# Patient Record
Sex: Male | Born: 1945
Health system: Southern US, Community
[De-identification: ages and names within clinical notes are randomized; demographics above are authoritative.]

## PROBLEM LIST (undated history)

## (undated) DIAGNOSIS — I2699 Other pulmonary embolism without acute cor pulmonale: Secondary | ICD-10-CM

## (undated) DIAGNOSIS — I499 Cardiac arrhythmia, unspecified: Secondary | ICD-10-CM

## (undated) DIAGNOSIS — I4891 Unspecified atrial fibrillation: Secondary | ICD-10-CM

## (undated) DIAGNOSIS — H9190 Unspecified hearing loss, unspecified ear: Secondary | ICD-10-CM

## (undated) DIAGNOSIS — I82409 Acute embolism and thrombosis of unspecified deep veins of unspecified lower extremity: Secondary | ICD-10-CM

## (undated) DIAGNOSIS — I10 Essential (primary) hypertension: Secondary | ICD-10-CM

## (undated) DIAGNOSIS — M1990 Unspecified osteoarthritis, unspecified site: Secondary | ICD-10-CM

## (undated) DIAGNOSIS — Z8672 Personal history of thrombophlebitis: Secondary | ICD-10-CM

## (undated) DIAGNOSIS — E785 Hyperlipidemia, unspecified: Secondary | ICD-10-CM

## (undated) HISTORY — DX: Essential (primary) hypertension: I10

## (undated) HISTORY — DX: Hyperlipidemia, unspecified: E78.5

## (undated) HISTORY — PX: JOINT REPLACEMENT: SHX530

## (undated) HISTORY — DX: Unspecified atrial fibrillation: I48.91

## (undated) HISTORY — DX: Personal history of thrombophlebitis: Z86.72

## (undated) HISTORY — DX: Unspecified osteoarthritis, unspecified site: M19.90

---

## 1973-08-26 HISTORY — PX: KNEE SURGERY: SHX244

## 2007-11-26 ENCOUNTER — Other Ambulatory Visit: Payer: Self-pay

## 2007-11-26 ENCOUNTER — Inpatient Hospital Stay: Payer: Self-pay | Admitting: Internal Medicine

## 2007-11-26 DIAGNOSIS — I2699 Other pulmonary embolism without acute cor pulmonale: Secondary | ICD-10-CM

## 2007-11-26 HISTORY — DX: Other pulmonary embolism without acute cor pulmonale: I26.99

## 2009-04-19 IMAGING — CT CT CHEST W/ CM
1 series · 15 of 32 positions shown, 19 images · IV contrast (APPLIED)
Comparison: none

REASON FOR EXAM: r/o PE
COMMENTS:

[Series 4: soft tissue · axial · 0.84mm/px · z∈[-175,+128]mm · 15 of 113 slices shown, 19 images]
[im 8/113  soft-tissue]
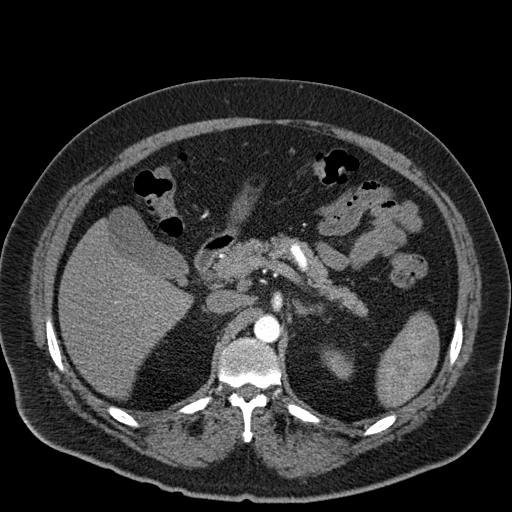
[im 8/113  bone]
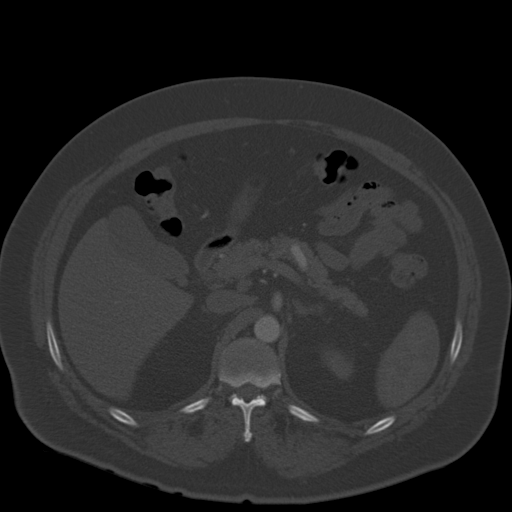
[im 15/113  soft-tissue]
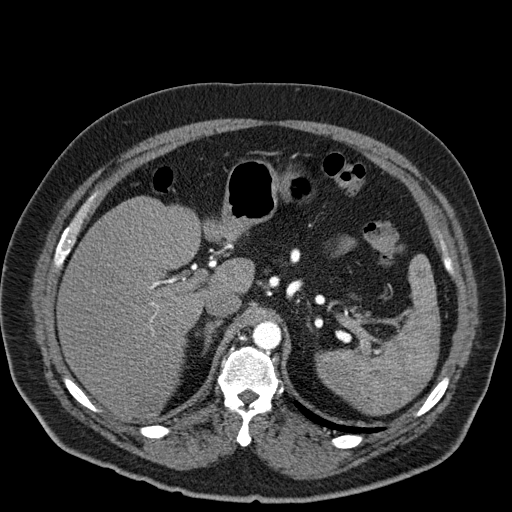
[im 22/113  soft-tissue]
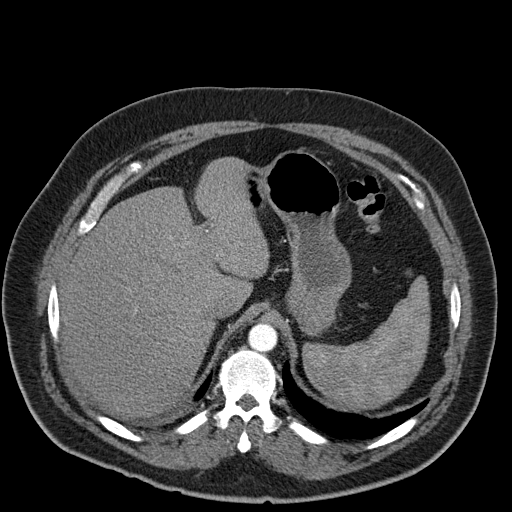
[im 33/113  soft-tissue]
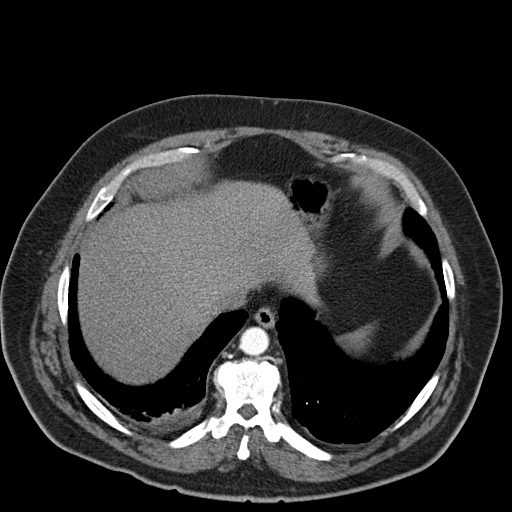
[im 40/113  soft-tissue]
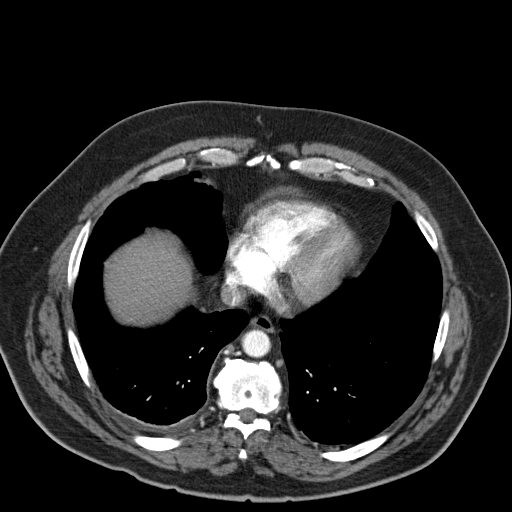
[im 47/113  soft-tissue]
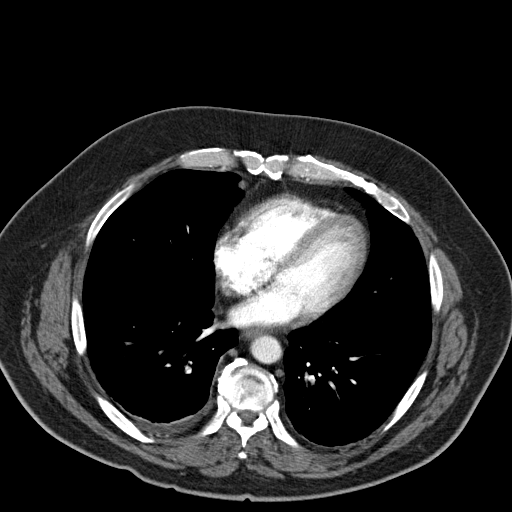
[im 58/113  soft-tissue]
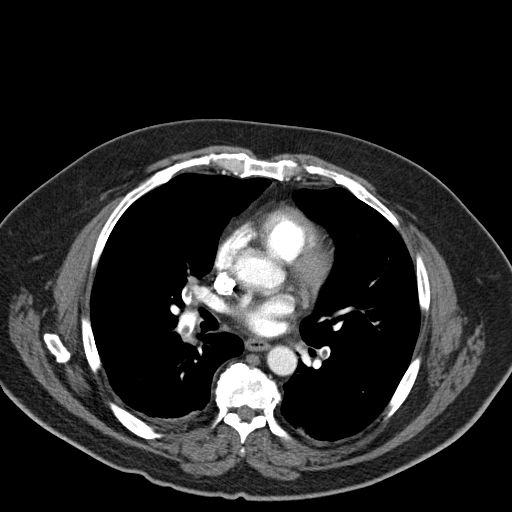
[im 66/113  soft-tissue]
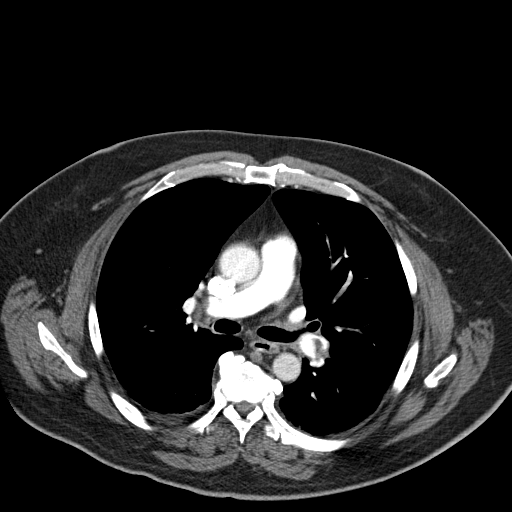
[im 73/113  soft-tissue]
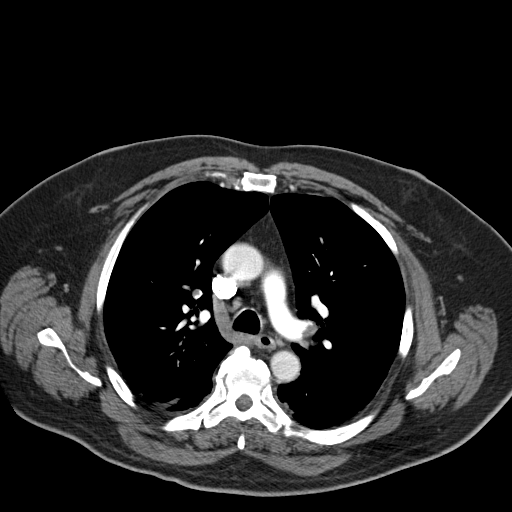
[im 73/113  bone]
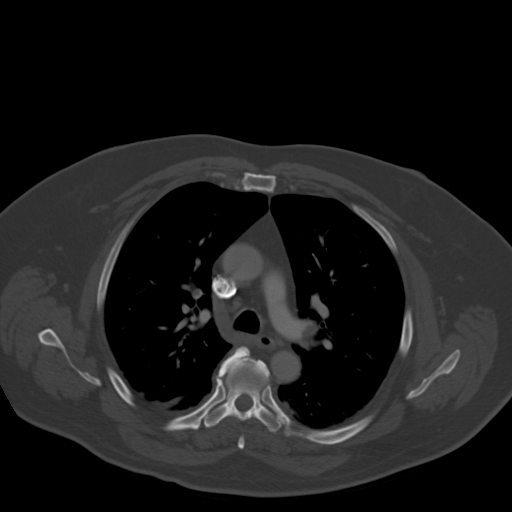
[im 80/113  soft-tissue]
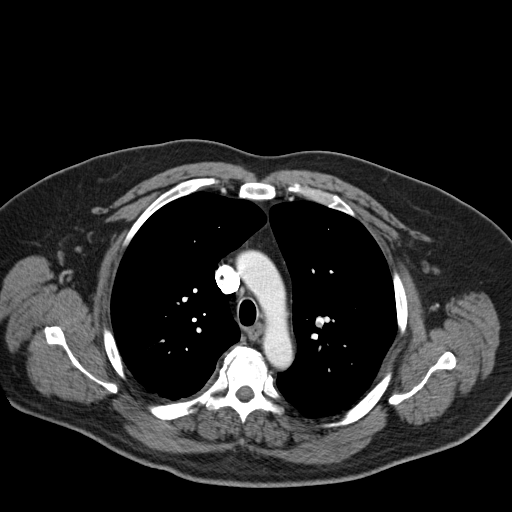
[im 91/113  soft-tissue]
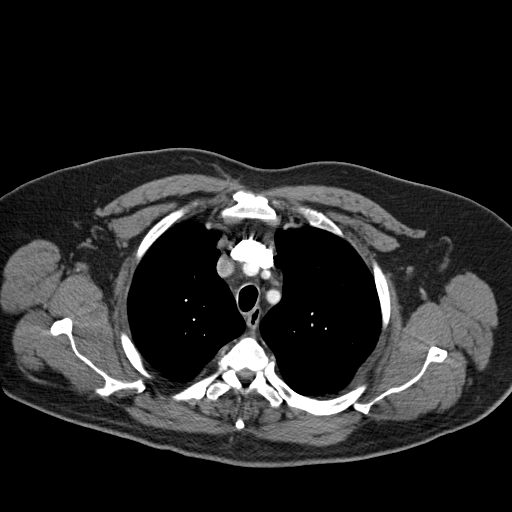
[im 98/113  soft-tissue]
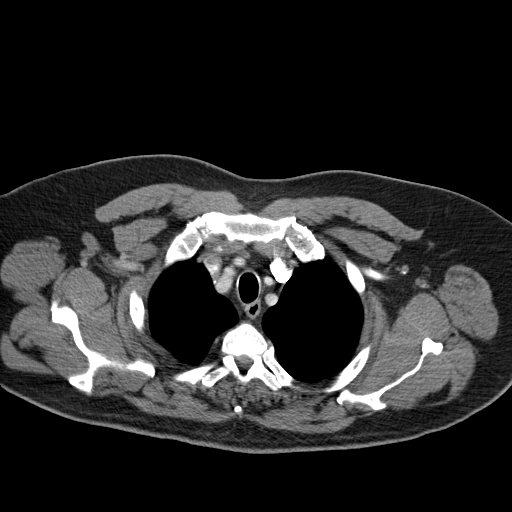
[im 98/113  lung]
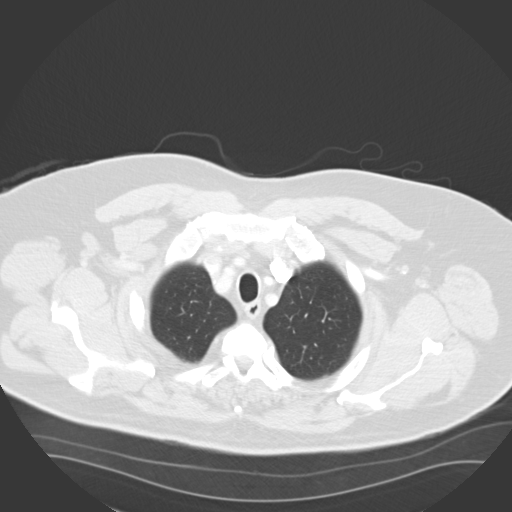
[im 102/113  lung]
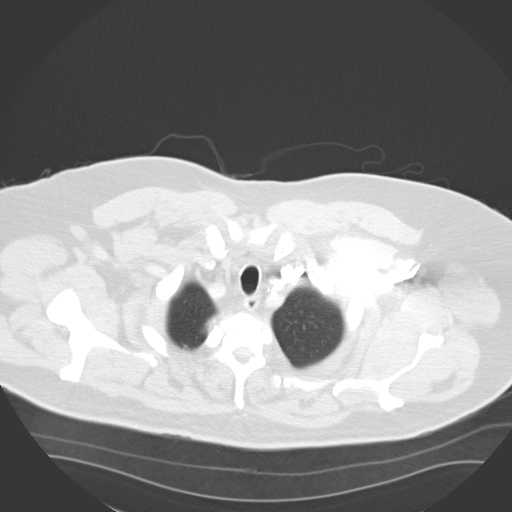
[im 105/113  soft-tissue]
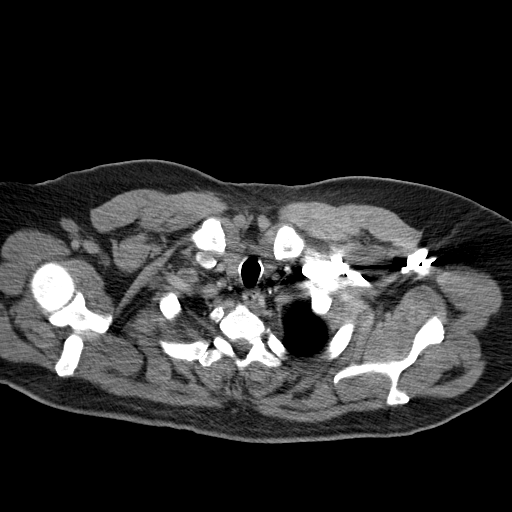
[im 105/113  lung]
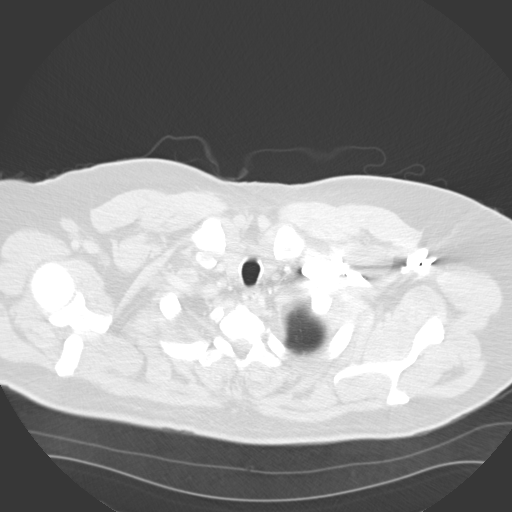
[im 109/113  lung]
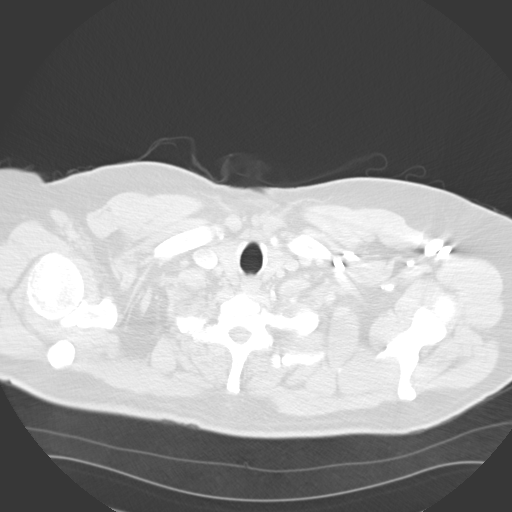

[15 of 32 positions shown; findings below may reference images not displayed]

PROCEDURE:     CT  - CT CHEST (FOR PE) W  - November 27, 2007  [DATE]

RESULT:     The patient received 85 cc of Isovue 370 for this study. There
are bilateral pulmonary emboli present. These lie in the proximal branches
of the right and left main pulmonary arteries. There is no evidence of
pulmonary infarction currently. There is a trace of fluid at the right lung
base with minimal atelectasis. The cardiac chambers are normal in size. The
caliber of the thoracic aorta is normal. No pathologic sized mediastinal or
hilar lymph nodes are evident. No abnormal pulmonary nodules are seen.
Within the upper abdomen the observed portions of the liver are normal. I
see no adrenal masses.
IMPRESSION: 1. There are bilateral pulmonary emboli present. These are chiefly in
branches of the lower lobe pulmonary arteries. I do not see evidence of
pulmonary infarction currently.
2. There is a tiny right pleural effusion and there is atelectasis in the
posterior costophrenic gutter on the right.

Dr.Leonela was paged with this result at 9 am on 27 November, 2007.

Critical value

## 2014-10-20 DIAGNOSIS — E785 Hyperlipidemia, unspecified: Secondary | ICD-10-CM | POA: Diagnosis not present

## 2014-10-20 DIAGNOSIS — Z125 Encounter for screening for malignant neoplasm of prostate: Secondary | ICD-10-CM | POA: Diagnosis not present

## 2014-10-20 DIAGNOSIS — I10 Essential (primary) hypertension: Secondary | ICD-10-CM | POA: Diagnosis not present

## 2014-10-20 DIAGNOSIS — Z7901 Long term (current) use of anticoagulants: Secondary | ICD-10-CM | POA: Diagnosis not present

## 2014-10-20 DIAGNOSIS — Z0000 Encounter for general adult medical examination without abnormal findings: Secondary | ICD-10-CM | POA: Diagnosis not present

## 2014-10-20 DIAGNOSIS — I4891 Unspecified atrial fibrillation: Secondary | ICD-10-CM | POA: Diagnosis not present

## 2014-10-31 DIAGNOSIS — G5602 Carpal tunnel syndrome, left upper limb: Secondary | ICD-10-CM | POA: Diagnosis not present

## 2014-11-03 ENCOUNTER — Ambulatory Visit: Payer: Self-pay | Admitting: Specialist

## 2014-11-03 DIAGNOSIS — Z01810 Encounter for preprocedural cardiovascular examination: Secondary | ICD-10-CM | POA: Diagnosis not present

## 2014-11-03 DIAGNOSIS — G5602 Carpal tunnel syndrome, left upper limb: Secondary | ICD-10-CM | POA: Diagnosis not present

## 2014-11-03 DIAGNOSIS — Z01812 Encounter for preprocedural laboratory examination: Secondary | ICD-10-CM | POA: Diagnosis not present

## 2014-11-03 DIAGNOSIS — I10 Essential (primary) hypertension: Secondary | ICD-10-CM | POA: Diagnosis not present

## 2014-11-08 ENCOUNTER — Ambulatory Visit: Payer: Self-pay | Admitting: Specialist

## 2014-11-08 DIAGNOSIS — G5602 Carpal tunnel syndrome, left upper limb: Secondary | ICD-10-CM | POA: Diagnosis not present

## 2014-11-08 DIAGNOSIS — Z862 Personal history of diseases of the blood and blood-forming organs and certain disorders involving the immune mechanism: Secondary | ICD-10-CM | POA: Diagnosis not present

## 2014-11-08 DIAGNOSIS — I10 Essential (primary) hypertension: Secondary | ICD-10-CM | POA: Diagnosis not present

## 2014-11-08 DIAGNOSIS — M1990 Unspecified osteoarthritis, unspecified site: Secondary | ICD-10-CM | POA: Diagnosis not present

## 2014-11-09 HISTORY — PX: HAND SURGERY: SHX662

## 2014-12-25 NOTE — Op Note (Signed)
PATIENT NAME:  Hector Bennett, Hector Bennett MR#:  [id number] DATE OF BIRTH:  [birth year]  DATE OF PROCEDURE:  11/08/2014  PREOPERATIVE DIAGNOSIS: Left carpal tunnel syndrome.   POSTOPERATIVE DIAGNOSIS: Left carpal tunnel syndrome.   PROCEDURE: Left carpal tunnel release.   SURGEON: Christophe Louis, M.D.   ANESTHESIA: General.   COMPLICATIONS: None.   TOURNIQUET TIME: 20 minutes.   DESCRIPTION OF PROCEDURE: After adequate induction of general anesthesia, the left upper extremity is thoroughly prepped with alcohol and ChloraPrep and draped in standard sterile fashion. The extremity is wrapped out with the Esmarch bandage and pneumatic tourniquet elevated to 250 mmHg. Under loupe magnification, standard volar carpal tunnel incision is made and the dissection carried down to the transverse retinacular ligament. Incision is made in the midportion with the knife. The distal release is performed with the small scissors. The proximal release is performed with the small scissors and the carpal tunnel scissors. There is seen to be moderate compression of the nerve directly beneath the ligament. Careful check is made both proximally and distally to ensure that complete release had been obtained. The wound is thoroughly irrigated multiple times. Skin edges were infiltrated with 0.5% plain Marcaine. The skin is closed with 4-0 nylon. A soft bulky dressing is applied. The tourniquet is released and the patient is returned to the recovery room in satisfactory condition having tolerated the procedure quite well. ____________________________ Lucas Mallow, MD ces:sb D: 11/08/2014 08:20:38 ET T: 11/08/2014 09:56:19 ET JOB#: [id number]  cc: Lucas Mallow, MD, <Dictator> Lucas Mallow MD ELECTRONICALLY SIGNED 11/19/2014 13:03

## 2015-03-20 ENCOUNTER — Other Ambulatory Visit: Payer: Self-pay | Admitting: Family Medicine

## 2015-04-20 ENCOUNTER — Encounter: Payer: Self-pay | Admitting: Family Medicine

## 2015-04-20 ENCOUNTER — Ambulatory Visit (INDEPENDENT_AMBULATORY_CARE_PROVIDER_SITE_OTHER): Payer: Medicare Other | Admitting: Family Medicine

## 2015-04-20 VITALS — BP 125/78 | HR 61 | Temp 98.0°F | Ht 69.4 in | Wt 290.0 lb

## 2015-04-20 DIAGNOSIS — I10 Essential (primary) hypertension: Secondary | ICD-10-CM

## 2015-04-20 DIAGNOSIS — I4891 Unspecified atrial fibrillation: Secondary | ICD-10-CM | POA: Insufficient documentation

## 2015-04-20 DIAGNOSIS — E785 Hyperlipidemia, unspecified: Secondary | ICD-10-CM

## 2015-04-20 DIAGNOSIS — Z7901 Long term (current) use of anticoagulants: Secondary | ICD-10-CM

## 2015-04-20 DIAGNOSIS — I481 Persistent atrial fibrillation: Secondary | ICD-10-CM | POA: Diagnosis not present

## 2015-04-20 DIAGNOSIS — I4819 Other persistent atrial fibrillation: Secondary | ICD-10-CM

## 2015-04-20 DIAGNOSIS — M1990 Unspecified osteoarthritis, unspecified site: Secondary | ICD-10-CM | POA: Diagnosis not present

## 2015-04-20 LAB — CBC WITH DIFFERENTIAL/PLATELET
HEMATOCRIT: 44.9 % (ref 37.5–51.0)
HEMOGLOBIN: 15.1 g/dL (ref 12.6–17.7)
Lymphocytes Absolute: 1.4 10*3/uL (ref 0.7–3.1)
Lymphs: 16 %
MCH: 31.1 pg (ref 26.6–33.0)
MCHC: 33.6 g/dL (ref 31.5–35.7)
MCV: 92 fL (ref 79–97)
MID (Absolute): 0.8 10*3/uL (ref 0.1–1.6)
MID: 9 %
NEUTROS ABS: 6.6 10*3/uL (ref 1.4–7.0)
NEUTROS PCT: 76 %
Platelets: 231 10*3/uL (ref 150–379)
RBC: 4.86 x10E6/uL (ref 4.14–5.80)
RDW: 14.1 % (ref 12.3–15.4)
WBC: 8.8 10*3/uL (ref 3.4–10.8)

## 2015-04-20 LAB — LP+ALT+AST PICCOLO, WAIVED
ALT (SGPT) PICCOLO, WAIVED: 14 U/L (ref 10–47)
AST (SGOT) PICCOLO, WAIVED: 20 U/L (ref 11–38)
CHOL/HDL RATIO PICCOLO,WAIVE: 2.8 mg/dL
Cholesterol Piccolo, Waived: 133 mg/dL (ref <200)
HDL CHOL PICCOLO, WAIVED: 48 mg/dL — AB (ref >59)
LDL Chol Calc Piccolo Waived: 57 mg/dL (ref <100)
TRIGLYCERIDES PICCOLO,WAIVED: 138 mg/dL (ref <150)
VLDL Chol Calc Piccolo,Waive: 28 mg/dL (ref <30)

## 2015-04-20 MED ORDER — RIVAROXABAN 20 MG PO TABS: 20.0000 mg | ORAL_TABLET | Freq: Every day | ORAL | Status: DC

## 2015-04-20 MED ORDER — LOVASTATIN 20 MG PO TABS: 20.0000 mg | ORAL_TABLET | Freq: Every day | ORAL | Status: DC

## 2015-04-20 MED ORDER — BENAZEPRIL-HYDROCHLOROTHIAZIDE 20-25 MG PO TABS: ORAL_TABLET | ORAL | Status: DC

## 2015-04-20 MED ORDER — ATENOLOL 50 MG PO TABS: 50.0000 mg | ORAL_TABLET | Freq: Every day | ORAL | Status: DC

## 2015-04-20 NOTE — Assessment & Plan Note (Signed)
The current medical regimen is effective;  continue present plan and medications.  

## 2015-04-20 NOTE — Progress Notes (Signed)
BP 125/78 mmHg  Pulse 61  Temp(Src) 98 F (36.7 C)  Ht 5' 9.4" (1.763 m)  Wt 290 lb (131.543 kg)  BMI 42.32 kg/m2  SpO2 96%   Subjective:    Patient ID: Hector Bennett, male    DOB: 1946/05/04, 69 y.o.   MRN: [id number]  HPI: Hector Bennett is a 69 y.o. male  Chief Complaint  Patient presents with  . Hyperlipidemia  . Hypertension  . long term use of anticoagulation   Asian follow-up hypertension hypercholesterol. Has been doing well with no side effects, taking medications faithfully Taking Xarelto without problems no bleeding no obvious bruising Taking for atrial fibrillation which is asymptomatic.  Patient's biggest concern today is his left knee which is chronically hurts sore tender Does not click or lock hurts with stepping walking has to limp some with walking problems been getting worse over this last year and especially worse over this summer. He is concerned and partially resigned to having knee replacement surgery.  Relevant past medical, surgical, family and social history reviewed and updated as indicated. Interim medical history since our last visit reviewed. Allergies and medications reviewed and updated.  Review of Systems  Constitutional: Negative.   Respiratory: Negative.   Cardiovascular: Negative.     Per HPI unless specifically indicated above     Objective:    BP 125/78 mmHg  Pulse 61  Temp(Src) 98 F (36.7 C)  Ht 5' 9.4" (1.763 m)  Wt 290 lb (131.543 kg)  BMI 42.32 kg/m2  SpO2 96%  Wt Readings from Last 3 Encounters:  04/20/15 290 lb (131.543 kg)  11/21/14 300 lb (136.079 kg)    Physical Exam  Constitutional: He is oriented to person, place, and time. He appears well-developed and well-nourished. No distress.  HENT:  Head: Normocephalic and atraumatic.  Right Ear: Hearing normal.  Left Ear: Hearing normal.  Nose: Nose normal.  Eyes: Conjunctivae and lids are normal. Right eye exhibits no discharge. Left eye exhibits no  discharge. No scleral icterus.  Cardiovascular: Normal heart sounds.   Pulmonary/Chest: Effort normal and breath sounds normal. No respiratory distress.  Musculoskeletal: Normal range of motion.  Knee exam with no joint effusion noted Some bony abnormality Full range of motion No clicking or locking  Neurological: He is alert and oriented to person, place, and time.  Skin: Skin is intact. No rash noted.  Psychiatric: He has a normal mood and affect. His speech is normal and behavior is normal. Judgment and thought content normal. Cognition and memory are normal.    No results found for this or any previous visit.    Assessment & Plan:   Problem List Items Addressed This Visit      Cardiovascular and Mediastinum   Hypertension    The current medical regimen is effective;  continue present plan and medications.       Relevant Medications   rivaroxaban (XARELTO) 20 MG TABS tablet   lovastatin (MEVACOR) 20 MG tablet   benazepril-hydrochlorthiazide (LOTENSIN HCT) 20-25 MG per tablet   atenolol (TENORMIN) 50 MG tablet   A-fib    Doing well on Xarelto with no side effects CBC normal today      Relevant Medications   rivaroxaban (XARELTO) 20 MG TABS tablet   lovastatin (MEVACOR) 20 MG tablet   benazepril-hydrochlorthiazide (LOTENSIN HCT) 20-25 MG per tablet   atenolol (TENORMIN) 50 MG tablet     Musculoskeletal and Integument   Arthritis    Arthritis changes knees left  worse than right Will refer to orthopedics to further evaluate Wants to see Dr. Marry Guan at [facility]. Discussed medication and to avoid nonsteroidal anti-inflammatory medications because of taking blood thinner and potential for GI bleeding. We'll use Tylenol for his knees May consider neoprene sleeve Discussed weight loss      Relevant Orders   Ambulatory referral to Orthopedic Surgery     Other   Hyperlipidemia    The current medical regimen is effective;  continue present plan and  medications.       Relevant Medications   rivaroxaban (XARELTO) 20 MG TABS tablet   lovastatin (MEVACOR) 20 MG tablet   benazepril-hydrochlorthiazide (LOTENSIN HCT) 20-25 MG per tablet   atenolol (TENORMIN) 50 MG tablet   Other Relevant Orders   LP+ALT+AST Piccolo, Waived    Other Visit Diagnoses    Essential hypertension, benign    -  Primary    Relevant Medications    rivaroxaban (XARELTO) 20 MG TABS tablet    lovastatin (MEVACOR) 20 MG tablet    benazepril-hydrochlorthiazide (LOTENSIN HCT) 20-25 MG per tablet    atenolol (TENORMIN) 50 MG tablet    Other Relevant Orders    Basic metabolic panel    Long-term (current) use of anticoagulants        Relevant Medications    rivaroxaban (XARELTO) 20 MG TABS tablet    Other Relevant Orders    CBC With Differential/Platelet        Follow up plan: Return in about 6 months (around 10/21/2015) for Physical Exam.

## 2015-04-20 NOTE — Assessment & Plan Note (Signed)
Doing well on Xarelto with no side effects CBC normal today

## 2015-04-20 NOTE — Assessment & Plan Note (Addendum)
Arthritis changes knees left worse than right Will refer to orthopedics to further evaluate Wants to see Dr. Marry Guan at [facility]. Discussed medication and to avoid nonsteroidal anti-inflammatory medications because of taking blood thinner and potential for GI bleeding. We'll use Tylenol for his knees May consider neoprene sleeve Discussed weight loss

## 2015-04-21 LAB — BASIC METABOLIC PANEL
BUN/Creatinine Ratio: 12 (ref 10–22)
BUN: 12 mg/dL (ref 8–27)
CALCIUM: 9.2 mg/dL (ref 8.6–10.2)
CO2: 22 mmol/L (ref 18–29)
Chloride: 100 mmol/L (ref 97–108)
Creatinine, Ser: 1.03 mg/dL (ref 0.76–1.27)
GFR calc Af Amer: 84 mL/min/{1.73_m2} (ref >59)
GFR calc non Af Amer: 73 mL/min/{1.73_m2} (ref >59)
GLUCOSE: 104 mg/dL — AB (ref 65–99)
Potassium: 4.2 mmol/L (ref 3.5–5.2)
Sodium: 141 mmol/L (ref 134–144)

## 2015-04-24 ENCOUNTER — Encounter: Payer: Self-pay | Admitting: Family Medicine

## 2015-05-04 DIAGNOSIS — M25561 Pain in right knee: Secondary | ICD-10-CM | POA: Diagnosis not present

## 2015-05-04 DIAGNOSIS — M170 Bilateral primary osteoarthritis of knee: Secondary | ICD-10-CM | POA: Diagnosis not present

## 2015-05-04 DIAGNOSIS — M25562 Pain in left knee: Secondary | ICD-10-CM | POA: Diagnosis not present

## 2015-05-23 DIAGNOSIS — I2699 Other pulmonary embolism without acute cor pulmonale: Secondary | ICD-10-CM | POA: Diagnosis not present

## 2015-05-23 DIAGNOSIS — M1712 Unilateral primary osteoarthritis, left knee: Secondary | ICD-10-CM | POA: Diagnosis not present

## 2015-05-29 DIAGNOSIS — M7989 Other specified soft tissue disorders: Secondary | ICD-10-CM | POA: Diagnosis not present

## 2015-05-29 DIAGNOSIS — M1990 Unspecified osteoarthritis, unspecified site: Secondary | ICD-10-CM | POA: Diagnosis not present

## 2015-05-29 DIAGNOSIS — M79609 Pain in unspecified limb: Secondary | ICD-10-CM | POA: Diagnosis not present

## 2015-05-29 DIAGNOSIS — I87099 Postthrombotic syndrome with other complications of unspecified lower extremity: Secondary | ICD-10-CM | POA: Diagnosis not present

## 2015-05-29 DIAGNOSIS — I872 Venous insufficiency (chronic) (peripheral): Secondary | ICD-10-CM | POA: Diagnosis not present

## 2015-05-30 ENCOUNTER — Other Ambulatory Visit: Payer: Self-pay | Admitting: Vascular Surgery

## 2015-05-31 ENCOUNTER — Other Ambulatory Visit: Payer: Self-pay | Admitting: Family Medicine

## 2015-05-31 NOTE — Progress Notes (Unsigned)
Patient taking Xarelto for atrial fibrillation. His been doing well with no excessive bleeding problems. Patient has knee replacement surgery scheduled in the near future. There is no need to bridge with Lovenox. Stop the Xarelto 2 days prior to surgery. Then the next day after surgery if there is no signs or symptoms of bleeding restart Xarelto. Hector Bennett was last seen in February 2016 for his regular physical and was okay for surgery at that time. We will be contacting him to come in ASAP for reassessment of his hypertension, hypercholesterol and preoperative status.

## 2015-06-08 ENCOUNTER — Ambulatory Visit: Payer: Self-pay | Admitting: Family Medicine

## 2015-07-11 ENCOUNTER — Encounter: Payer: Self-pay | Admitting: Family Medicine

## 2015-07-11 ENCOUNTER — Ambulatory Visit (INDEPENDENT_AMBULATORY_CARE_PROVIDER_SITE_OTHER): Payer: Medicare Other | Admitting: Family Medicine

## 2015-07-11 VITALS — BP 118/73 | HR 61 | Temp 98.7°F | Ht 68.3 in | Wt 290.0 lb

## 2015-07-11 DIAGNOSIS — M1712 Unilateral primary osteoarthritis, left knee: Secondary | ICD-10-CM

## 2015-07-11 DIAGNOSIS — M1711 Unilateral primary osteoarthritis, right knee: Secondary | ICD-10-CM | POA: Insufficient documentation

## 2015-07-11 DIAGNOSIS — I824Y1 Acute embolism and thrombosis of unspecified deep veins of right proximal lower extremity: Secondary | ICD-10-CM

## 2015-07-11 DIAGNOSIS — I82409 Acute embolism and thrombosis of unspecified deep veins of unspecified lower extremity: Secondary | ICD-10-CM | POA: Insufficient documentation

## 2015-07-11 DIAGNOSIS — Z23 Encounter for immunization: Secondary | ICD-10-CM | POA: Diagnosis not present

## 2015-07-11 NOTE — Assessment & Plan Note (Signed)
Patient doing well with no symptoms With knee replacement surgery coming up filter will be placed an inferior vena cava

## 2015-07-11 NOTE — Assessment & Plan Note (Signed)
>>  ASSESSMENT AND PLAN FOR OBESITY, CLASS III, BMI 40-49.9 (MORBID OBESITY) (HCC) WRITTEN ON 07/11/2015  4:23 PM BY Baelynn Schmuhl A, MD  Discuss weight loss

## 2015-07-11 NOTE — Progress Notes (Signed)
BP 118/73 mmHg  Pulse 61  Temp(Src) 98.7 F (37.1 C)  Ht 5' 8.3" (1.735 m)  Wt 290 lb (131.543 kg)  BMI 43.70 kg/m2  SpO2 94%   Subjective:    Patient ID: Hector Bennett, male    DOB: 12-02-1945, 69 y.o.   MRN: [id number]  HPI: Hector Bennett is a 69 y.o. male  Chief Complaint  Patient presents with  . surgical clearance    total left knee replacement   patient all in all doing well no complaints except for knee. Has been taking blood pressure medication without problems and good control of blood pressure Taking lovastatin without problems Xarelto with no bleeding issues no bruising No chest pain no angina symptoms no shortness of breath no PND orthopnea Patient's no swelling left leg right leg with history of DVT mild occasional edema otherwise clear.  Relevant past medical, surgical, family and social history reviewed and updated as indicated. Interim medical history since our last visit reviewed. Allergies and medications reviewed and updated. Other than noted above Review of Systems  Constitutional: Negative.   HENT: Negative.   Eyes: Negative.   Respiratory: Negative.   Cardiovascular: Negative.   Gastrointestinal: Negative.   Endocrine: Negative.   Genitourinary: Negative.   Musculoskeletal: Negative.   Skin: Negative.   Allergic/Immunologic: Negative.   Neurological: Negative.   Hematological: Negative.   Psychiatric/Behavioral: Negative.     Per HPI unless specifically indicated above     Objective:    BP 118/73 mmHg  Pulse 61  Temp(Src) 98.7 F (37.1 C)  Ht 5' 8.3" (1.735 m)  Wt 290 lb (131.543 kg)  BMI 43.70 kg/m2  SpO2 94%  Wt Readings from Last 3 Encounters:  07/11/15 290 lb (131.543 kg)  04/20/15 290 lb (131.543 kg)  11/21/14 300 lb (136.079 kg)    Physical Exam  Constitutional: He is oriented to person, place, and time. He appears well-developed and well-nourished. No distress.  HENT:  Head: Normocephalic and atraumatic.  Right  Ear: Hearing and external ear normal.  Left Ear: Hearing and external ear normal.  Nose: Nose normal.  Mouth/Throat: Oropharynx is clear and moist.  Eyes: Conjunctivae and lids are normal. Pupils are equal, round, and reactive to light. Right eye exhibits no discharge. Left eye exhibits no discharge. No scleral icterus.  Neck: No JVD present. No thyromegaly present.  Cardiovascular: Normal rate and normal heart sounds.   No murmur heard. Pulmonary/Chest: Effort normal and breath sounds normal. No respiratory distress. He has no wheezes. He has no rales.  Abdominal: Soft. Bowel sounds are normal. He exhibits mass. He exhibits no distension. There is no tenderness. There is no rebound and no guarding.  Musculoskeletal: Normal range of motion. He exhibits no edema or tenderness.  Lymphadenopathy:    He has no cervical adenopathy.  Neurological: He is alert and oriented to person, place, and time. Coordination normal.  Skin: Skin is warm, dry and intact. No rash noted.  Psychiatric: He has a normal mood and affect. His speech is normal and behavior is normal. Judgment and thought content normal. Cognition and memory are normal.    Results for orders placed or performed in visit on 04/20/15  CBC With Differential/Platelet  Result Value Ref Range   WBC 8.8 3.4 - 10.8 x10E3/uL   RBC 4.86 4.14 - 5.80 x10E6/uL   Hemoglobin 15.1 12.6 - 17.7 g/dL   Hematocrit 44.9 37.5 - 51.0 %   MCV 92 79 - 97 fL  MCH 31.1 26.6 - 33.0 pg   MCHC 33.6 31.5 - 35.7 g/dL   RDW 14.1 12.3 - 15.4 %   Platelets 231 150 - 379 x10E3/uL   Neutrophils 76 %   Lymphs 16 %   MID 9 %   Neutrophils Absolute 6.6 1.4 - 7.0 x10E3/uL   Lymphocytes Absolute 1.4 0.7 - 3.1 x10E3/uL   MID (Absolute) 0.8 0.1 - 1.6 X10E3/uL  LP+ALT+AST Piccolo, Waived  Result Value Ref Range   ALT (SGPT) Piccolo, Waived 14 10 - 47 U/L   AST (SGOT) Piccolo, Waived 20 11 - 38 U/L   Cholesterol Piccolo, Waived 133 <200 mg/dL   HDL Chol Piccolo,  Waived 48 (L) >59 mg/dL   Triglycerides Piccolo,Waived 138 <150 mg/dL   Chol/HDL Ratio Piccolo,Waive 2.8 mg/dL   LDL Chol Calc Piccolo Waived 57 <100 mg/dL   VLDL Chol Calc Piccolo,Waive 28 <30 mg/dL  Basic metabolic panel  Result Value Ref Range   Glucose 104 (H) 65 - 99 mg/dL   BUN 12 8 - 27 mg/dL   Creatinine, Ser 1.03 0.76 - 1.27 mg/dL   GFR calc non Af Amer 73 >59 mL/min/1.73   GFR calc Af Amer 84 >59 mL/min/1.73   BUN/Creatinine Ratio 12 10 - 22   Sodium 141 134 - 144 mmol/L   Potassium 4.2 3.5 - 5.2 mmol/L   Chloride 100 97 - 108 mmol/L   CO2 22 18 - 29 mmol/L   Calcium 9.2 8.6 - 10.2 mg/dL      Assessment & Plan:   Problem List Items Addressed This Visit      Cardiovascular and Mediastinum   DVT (deep venous thrombosis) (HCC)    Patient doing well with no symptoms With knee replacement surgery coming up filter will be placed an inferior vena cava        Musculoskeletal and Integument   Left knee DJD    Patient cleared for surgery for left knee replacement      Relevant Medications   naproxen sodium (RA NAPROXEN SODIUM) 220 MG tablet     Other   Obesity, Class III, BMI 40-49.9 (morbid obesity) (HCC)    Discuss weight loss       Other Visit Diagnoses    Immunization due    -  Primary    Relevant Orders    Flu Vaccine QUAD 36+ mos PF IM (Fluarix & Fluzone Quad PF) (Completed)        Follow up plan: Return for As scheduled.

## 2015-07-11 NOTE — Assessment & Plan Note (Signed)
Patient cleared for surgery for left knee replacement

## 2015-07-11 NOTE — Assessment & Plan Note (Signed)
Discuss weight loss

## 2015-07-19 ENCOUNTER — Encounter
Admission: RE | Admit: 2015-07-19 | Discharge: 2015-07-19 | Disposition: A | Discharge status: Home / Self Care | Payer: Medicare Other | Source: Ambulatory Visit | Attending: Orthopedic Surgery | Admitting: Orthopedic Surgery

## 2015-07-19 DIAGNOSIS — Z01810 Encounter for preprocedural cardiovascular examination: Secondary | ICD-10-CM | POA: Diagnosis not present

## 2015-07-19 DIAGNOSIS — Z01812 Encounter for preprocedural laboratory examination: Secondary | ICD-10-CM | POA: Diagnosis not present

## 2015-07-19 DIAGNOSIS — I2699 Other pulmonary embolism without acute cor pulmonale: Secondary | ICD-10-CM | POA: Diagnosis not present

## 2015-07-19 HISTORY — DX: Other pulmonary embolism without acute cor pulmonale: I26.99

## 2015-07-19 LAB — CBC
HEMATOCRIT: 44.7 % (ref 40.0–52.0)
HEMOGLOBIN: 14.8 g/dL (ref 13.0–18.0)
MCH: 30.7 pg (ref 26.0–34.0)
MCHC: 33.2 g/dL (ref 32.0–36.0)
MCV: 92.3 fL (ref 80.0–100.0)
Platelets: 196 10*3/uL (ref 150–440)
RBC: 4.84 MIL/uL (ref 4.40–5.90)
RDW: 13.7 % (ref 11.5–14.5)
WBC: 8 10*3/uL (ref 3.8–10.6)

## 2015-07-19 LAB — URINALYSIS COMPLETE WITH MICROSCOPIC (ARMC ONLY)
BACTERIA UA: NONE SEEN
Bilirubin Urine: NEGATIVE
Glucose, UA: NEGATIVE mg/dL
Hgb urine dipstick: NEGATIVE
KETONES UR: NEGATIVE mg/dL
Leukocytes, UA: NEGATIVE
NITRITE: NEGATIVE
PROTEIN: NEGATIVE mg/dL
Specific Gravity, Urine: 1.019 (ref 1.005–1.030)
pH: 6 (ref 5.0–8.0)

## 2015-07-19 LAB — BASIC METABOLIC PANEL
ANION GAP: 6 (ref 5–15)
BUN: 15 mg/dL (ref 6–20)
CALCIUM: 9 mg/dL (ref 8.9–10.3)
CO2: 25 mmol/L (ref 22–32)
Chloride: 108 mmol/L (ref 101–111)
Creatinine, Ser: 0.82 mg/dL (ref 0.61–1.24)
GFR calc non Af Amer: >60 mL/min (ref >60)
Glucose, Bld: 100 mg/dL — ABNORMAL HIGH (ref 65–99)
Potassium: 3.9 mmol/L (ref 3.5–5.1)
SODIUM: 139 mmol/L (ref 135–145)

## 2015-07-19 LAB — SURGICAL PCR SCREEN
MRSA, PCR: NEGATIVE
Staphylococcus aureus: NEGATIVE

## 2015-07-19 LAB — TYPE AND SCREEN
ABO/RH(D): A NEG
ANTIBODY SCREEN: NEGATIVE

## 2015-07-19 LAB — SEDIMENTATION RATE: SED RATE: 5 mm/h (ref 0–20)

## 2015-07-19 LAB — ABO/RH: ABO/RH(D): A NEG

## 2015-07-19 LAB — PROTIME-INR
INR: 1.66
PROTHROMBIN TIME: 19.6 s — AB (ref 11.4–15.0)

## 2015-07-19 LAB — APTT: aPTT: 35 seconds (ref 24–36)

## 2015-07-19 NOTE — Pre-Procedure Instructions (Addendum)
[place] at Dr. Clydell Hakim office regarding pt's history of pulmonary embolism and phlebitis and pt not being able to receive tranexamic acid per hospital protocol.

## 2015-07-19 NOTE — Patient Instructions (Addendum)
  Your procedure is scheduled on: Monday Dec. 5, 2016. Report to Same Day Surgery. To find out your arrival time please call [phone] between 1PM - 3PM on Friday Dec. 2, 2016 .  Remember: Instructions that are not followed completely may result in serious medical risk, up to and including death, or upon the discretion of your surgeon and anesthesiologist your surgery may need to be rescheduled.    _x___ 1. Do not eat food or drink liquids after midnight. No gum chewing or hard candies.     _x___ 2. No Alcohol for 24 hours before or after surgery.   ____ 3. Bring all medications with you on the day of surgery if instructed.    __x__ 4. Notify your doctor if there is any change in your medical condition     (cold, fever, infections).     Do not wear jewelry, make-up, hairpins, clips or nail polish.  Do not wear lotions, powders, or perfumes. You may wear deodorant.  Do not shave 48 hours prior to surgery. Men may shave face and neck.  Do not bring valuables to the hospital.    [facility] is not responsible for any belongings or valuables.               Contacts, dentures or bridgework may not be worn into surgery.  Leave your suitcase in the car. After surgery it may be brought to your room.  For patients admitted to the hospital, discharge time is determined by your treatment team.   Patients discharged the day of surgery will not be allowed to drive home.    Please read over the following fact sheets that you were given:   [facility] Preparing for Surgery  _x___ Take these medicines the morning of surgery with A SIP OF WATER:    1. atenolol (TENORMIN)     ____ Fleet Enema (as directed)   _x___ Use CHG Soap as directed  ____ Use inhalers on the day of surgery  ____ Stop metformin 2 days prior to surgery    ____ Take 1/2 of usual insulin dose the night before surgery and none on the morning of surgery.   _x___ Ask Smith Robert when to stop Xarelto.  __x__ Stop  Anti-inflammatories Aleve on Nov. 28, 2016.   _x___ Stop supplements Glucosamine Chondroitin  until after surgery.    ____ Bring C-Pap to the hospital.

## 2015-07-20 LAB — URINE CULTURE: Special Requests: NORMAL

## 2015-07-25 ENCOUNTER — Encounter
Admission: RE | Disposition: A | Discharge status: Home / Self Care | Payer: Self-pay | Source: Ambulatory Visit | Attending: Vascular Surgery

## 2015-07-25 ENCOUNTER — Encounter: Payer: Self-pay | Admitting: *Deleted

## 2015-07-25 ENCOUNTER — Ambulatory Visit
Admission: RE | Admit: 2015-07-25 | Discharge: 2015-07-25 | Disposition: A | Discharge status: Home / Self Care | Payer: Medicare Other | Source: Ambulatory Visit | Attending: Vascular Surgery | Admitting: Vascular Surgery

## 2015-07-25 DIAGNOSIS — Z79899 Other long term (current) drug therapy: Secondary | ICD-10-CM | POA: Diagnosis not present

## 2015-07-25 DIAGNOSIS — D6859 Other primary thrombophilia: Secondary | ICD-10-CM | POA: Diagnosis not present

## 2015-07-25 DIAGNOSIS — Z6841 Body Mass Index (BMI) 40.0 and over, adult: Secondary | ICD-10-CM | POA: Diagnosis not present

## 2015-07-25 DIAGNOSIS — M179 Osteoarthritis of knee, unspecified: Secondary | ICD-10-CM | POA: Diagnosis not present

## 2015-07-25 DIAGNOSIS — Z86718 Personal history of other venous thrombosis and embolism: Secondary | ICD-10-CM | POA: Diagnosis not present

## 2015-07-25 DIAGNOSIS — I80209 Phlebitis and thrombophlebitis of unspecified deep vessels of unspecified lower extremity: Secondary | ICD-10-CM | POA: Diagnosis not present

## 2015-07-25 DIAGNOSIS — E669 Obesity, unspecified: Secondary | ICD-10-CM | POA: Insufficient documentation

## 2015-07-25 HISTORY — PX: PERIPHERAL VASCULAR CATHETERIZATION: SHX172C

## 2015-07-25 HISTORY — DX: Acute embolism and thrombosis of unspecified deep veins of unspecified lower extremity: I82.409

## 2015-07-25 SURGERY — IVC FILTER INSERTION
Anesthesia: Moderate Sedation

## 2015-07-25 MED ORDER — SODIUM CHLORIDE 0.9 % IV SOLN
INTRAVENOUS | Status: DC
  Administered 2015-07-25 (×2): via INTRAVENOUS

## 2015-07-25 MED ORDER — MIDAZOLAM HCL 2 MG/2ML IJ SOLN
INTRAMUSCULAR | Status: AC
  Filled 2015-07-25: qty 2

## 2015-07-25 MED ORDER — LIDOCAINE HCL 1 % IJ SOLN
INTRAMUSCULAR | Status: DC | PRN
  Administered 2015-07-25: 5 mL

## 2015-07-25 MED ORDER — DEXTROSE 5 % IV SOLN
1.5000 g | INTRAVENOUS | Status: AC
  Administered 2015-07-25: 1.5 g via INTRAVENOUS
  Filled 2015-07-25: qty 1.5

## 2015-07-25 MED ORDER — LIDOCAINE HCL (PF) 1 % IJ SOLN
INTRAMUSCULAR | Status: AC
  Filled 2015-07-25: qty 10

## 2015-07-25 MED ORDER — FENTANYL CITRATE (PF) 100 MCG/2ML IJ SOLN
INTRAMUSCULAR | Status: AC
  Filled 2015-07-25: qty 2

## 2015-07-25 MED ORDER — HEPARIN (PORCINE) IN NACL 2-0.9 UNIT/ML-% IJ SOLN
INTRAMUSCULAR | Status: AC
  Filled 2015-07-25: qty 1000

## 2015-07-25 MED ORDER — MIDAZOLAM HCL 2 MG/2ML IJ SOLN
INTRAMUSCULAR | Status: DC | PRN
  Administered 2015-07-25: 2 mg via INTRAVENOUS
  Administered 2015-07-25: 1 mg via INTRAVENOUS

## 2015-07-25 MED ORDER — FENTANYL CITRATE (PF) 100 MCG/2ML IJ SOLN
INTRAMUSCULAR | Status: DC | PRN
  Administered 2015-07-25: 50 ug via INTRAVENOUS
  Administered 2015-07-25: 100 ug via INTRAVENOUS

## 2015-07-25 SURGICAL SUPPLY — 3 items
FILTER VC CELECT-FEMORAL (Filter) ×3 IMPLANT
PACK ANGIOGRAPHY (CUSTOM PROCEDURE TRAY) ×3 IMPLANT
WIRE J 3MM .035X145CM (WIRE) ×3 IMPLANT

## 2015-07-25 NOTE — Discharge Instructions (Signed)
Inferior Vena Cava Filter Insertion, Care After  Refer to this sheet in the next few weeks. These instructions provide you with information on caring for yourself after your procedure. Your health care provider may also give you more specific instructions. Your treatment has been planned according to current medical practices, but problems sometimes occur. Call your health care provider if you have any problems or questions after your procedure.  WHAT TO EXPECT AFTER THE PROCEDURE  After your procedure, it is typical to have the following:   Mild pain in the area where the filter was inserted.   Mild bruising in the area where the filter was inserted.  HOME CARE INSTRUCTIONS   You will be given medicine to control pain. Only take over-the-counter or prescription medicines for pain, fever, or discomfort as directed by your health care provider.   A bandage (dressing) has been placed over the insertion site. Follow your health care provider's instructions on how to care for it.   Keep the insertion site clean and dry.   Do not soak in a bath tub or pool until the filter insertion site has healed.   Do not drive if you are taking narcotic pain medicines. Follow your health care provider's instructions about driving.   Do not return to work or school until your health care provider says it is okay.    Keep all follow-up appointments.   SEEK IMMEDIATE MEDICAL CARE IF:   You develop swelling and discoloration or pain in the legs.   Your legs become pale and cold or blue.   You develop shortness of breath, feel faint, or pass out.   You develop chest pain, a cough, or difficulty breathing.   You cough up blood.   You develop a rash or feel you are having problems that may be a side effect of medicines.   You develop weakness, difficulty moving your arms or legs, or balance problems.   You develop problems with speech or vision.     This information is not intended to replace advice given to you by your  health care provider. Make sure you discuss any questions you have with your health care provider.     Document Released: 06/02/2013 Document Reviewed: 06/02/2013  Elsevier Interactive Patient Education 2016 [employer]

## 2015-07-25 NOTE — Op Note (Signed)
[facility]   OPERATIVE NOTE    PRE-OPERATIVE DIAGNOSIS: Degenerative joint disease requiring total knee replacement; hypercoagulable state with history of DVT  POST-OPERATIVE DIAGNOSIS: Same  PROCEDURE: 1.   Ultrasound guidance for vascular access to the right common femoral vein 2.   Catheter placement into the inferior vena cava 3.   Inferior venacavogram 4.   Placement of a Celect IVC filter  SURGEON: Schnier, Dolores Lory  ASSISTANT(S): None  ANESTHESIA: local/sedation  ESTIMATED BLOOD LOSS: minimal  FINDING(S): 1.  Patent IVC  SPECIMEN(S):  none  INDICATIONS:   Hector Bennett is a 69 y.o. y.o. male who presents with the need for total knee replacement and a past history of multiple DVTs.  Inferior vena cava filter is indicated for this reason.  Risks and benefits including filter thrombosis, migration, fracture, bleeding, and infection were all discussed.  We discussed that all IVC filters that we place can be removed if desired from the patient once the need for the filter has passed.    DESCRIPTION: After obtaining full informed written consent, the patient was brought back to the vascular suite. The skin was sterilely prepped and draped in a sterile surgical field was created. The right common femoral vein was accessed under direct ultrasound guidance without difficulty with a Seldinger needle and a J-wire was then placed. The dilator is passed over the wire and the delivery sheath was placed into the inferior vena cava.  Inferior venacavogram was performed. This demonstrated a patent IVC with the level of the renal veins at L1.  The filter was then deployed into the inferior vena cava at the level of L2 just below the renal veins. The delivery sheath was then removed. Pressure was held. Sterile dressings were placed. The patient tolerated the procedure well and was taken to the recovery room in stable condition.  Interpretation: Widely patent IVC  20 mm in diameter filter deployed with good orientation  COMPLICATIONS: None  CONDITION: Stable  Katha Cabal, M.D.  07/25/2015, 2:27 PM

## 2015-07-25 NOTE — H&P (Signed)
[facility] History & Physical Update  The patient was interviewed and re-examined.  The patient's previous History and Physical has been reviewed and is unchanged.  There is no change in the plan of care. We plan to proceed with the scheduled procedure.  Schnier, Dolores Lory, MD  07/25/2015, 2:27 PM

## 2015-07-31 ENCOUNTER — Inpatient Hospital Stay: Payer: Medicare Other | Admitting: Anesthesiology

## 2015-07-31 ENCOUNTER — Inpatient Hospital Stay
Admission: RE | Admit: 2015-07-31 | Discharge: 2015-08-02 | DRG: 470 | Disposition: A | Discharge status: Home / Self Care | Payer: Medicare Other | Source: Ambulatory Visit | Attending: Orthopedic Surgery | Admitting: Orthopedic Surgery

## 2015-07-31 ENCOUNTER — Encounter
Admission: RE | Disposition: A | Discharge status: Home / Self Care | Payer: Self-pay | Source: Ambulatory Visit | Attending: Orthopedic Surgery

## 2015-07-31 ENCOUNTER — Inpatient Hospital Stay: Payer: Medicare Other

## 2015-07-31 ENCOUNTER — Encounter: Payer: Self-pay | Admitting: *Deleted

## 2015-07-31 DIAGNOSIS — Z96642 Presence of left artificial hip joint: Secondary | ICD-10-CM | POA: Diagnosis not present

## 2015-07-31 DIAGNOSIS — I4891 Unspecified atrial fibrillation: Secondary | ICD-10-CM | POA: Diagnosis not present

## 2015-07-31 DIAGNOSIS — Z6841 Body Mass Index (BMI) 40.0 and over, adult: Secondary | ICD-10-CM

## 2015-07-31 DIAGNOSIS — Z471 Aftercare following joint replacement surgery: Secondary | ICD-10-CM | POA: Diagnosis not present

## 2015-07-31 DIAGNOSIS — M1712 Unilateral primary osteoarthritis, left knee: Secondary | ICD-10-CM | POA: Diagnosis not present

## 2015-07-31 DIAGNOSIS — E785 Hyperlipidemia, unspecified: Secondary | ICD-10-CM | POA: Diagnosis present

## 2015-07-31 DIAGNOSIS — Z8672 Personal history of thrombophlebitis: Secondary | ICD-10-CM | POA: Diagnosis not present

## 2015-07-31 DIAGNOSIS — I10 Essential (primary) hypertension: Secondary | ICD-10-CM | POA: Diagnosis not present

## 2015-07-31 DIAGNOSIS — Z96659 Presence of unspecified artificial knee joint: Secondary | ICD-10-CM

## 2015-07-31 DIAGNOSIS — Z86711 Personal history of pulmonary embolism: Secondary | ICD-10-CM | POA: Diagnosis not present

## 2015-07-31 HISTORY — PX: KNEE ARTHROPLASTY: SHX992

## 2015-07-31 SURGERY — ARTHROPLASTY, KNEE, TOTAL, USING IMAGELESS COMPUTER-ASSISTED NAVIGATION
Anesthesia: General | Laterality: Left

## 2015-07-31 MED ORDER — METOCLOPRAMIDE HCL 10 MG PO TABS
10.0000 mg | ORAL_TABLET | Freq: Three times a day (TID) | ORAL | Status: DC
Start: 2015-07-31 — End: 2015-08-02
  Administered 2015-07-31 – 2015-08-02 (×6): 10 mg via ORAL
  Filled 2015-07-31 (×6): qty 1

## 2015-07-31 MED ORDER — BUPIVACAINE HCL (PF) 0.5 % IJ SOLN
INTRAMUSCULAR | Status: DC | PRN
  Administered 2015-07-31: 3 mL

## 2015-07-31 MED ORDER — FAMOTIDINE 20 MG PO TABS
20.0000 mg | ORAL_TABLET | Freq: Once | ORAL | Status: AC
  Administered 2015-07-31: 20 mg via ORAL

## 2015-07-31 MED ORDER — DIPHENHYDRAMINE HCL 12.5 MG/5ML PO ELIX: 12.5000 mg | ORAL_SOLUTION | ORAL | Status: DC | PRN

## 2015-07-31 MED ORDER — HYDROCHLOROTHIAZIDE 25 MG PO TABS
25.0000 mg | ORAL_TABLET | Freq: Every day | ORAL | Status: DC
  Administered 2015-08-01 – 2015-08-02 (×2): 25 mg via ORAL
  Filled 2015-07-31 (×2): qty 1

## 2015-07-31 MED ORDER — MIDAZOLAM HCL 5 MG/5ML IJ SOLN
INTRAMUSCULAR | Status: DC | PRN
Start: 2015-07-31 — End: 2015-07-31
  Administered 2015-07-31: 2 mg via INTRAVENOUS

## 2015-07-31 MED ORDER — CEFAZOLIN SODIUM 10 G IJ SOLR
3.0000 g | Freq: Once | INTRAMUSCULAR | Status: AC
  Administered 2015-07-31: 3 g via INTRAVENOUS
  Filled 2015-07-31: qty 3000

## 2015-07-31 MED ORDER — ALUM & MAG HYDROXIDE-SIMETH 200-200-20 MG/5ML PO SUSP: 30.0000 mL | ORAL | Status: DC | PRN

## 2015-07-31 MED ORDER — ONDANSETRON HCL 4 MG PO TABS: 4.0000 mg | ORAL_TABLET | Freq: Four times a day (QID) | ORAL | Status: DC | PRN

## 2015-07-31 MED ORDER — GLYCOPYRROLATE 0.2 MG/ML IJ SOLN
INTRAMUSCULAR | Status: DC | PRN
  Administered 2015-07-31: 0.2 mg via INTRAVENOUS

## 2015-07-31 MED ORDER — ONDANSETRON HCL 4 MG/2ML IJ SOLN
4.0000 mg | Freq: Once | INTRAMUSCULAR | Status: DC | PRN
Start: 2015-07-31 — End: 2015-07-31

## 2015-07-31 MED ORDER — PROPOFOL 500 MG/50ML IV EMUL
INTRAVENOUS | Status: DC | PRN
  Administered 2015-07-31: 65 ug/kg/min via INTRAVENOUS

## 2015-07-31 MED ORDER — PHENYLEPHRINE HCL 10 MG/ML IJ SOLN
10000.0000 ug | INTRAMUSCULAR | Status: DC | PRN
  Administered 2015-07-31: 40 ug/min via INTRAVENOUS

## 2015-07-31 MED ORDER — PANTOPRAZOLE SODIUM 40 MG PO TBEC
40.0000 mg | DELAYED_RELEASE_TABLET | Freq: Two times a day (BID) | ORAL | Status: DC
  Administered 2015-07-31 – 2015-08-02 (×4): 40 mg via ORAL
  Filled 2015-07-31 (×4): qty 1

## 2015-07-31 MED ORDER — SODIUM CHLORIDE 0.9 % IJ SOLN
INTRAMUSCULAR | Status: AC
  Filled 2015-07-31: qty 50

## 2015-07-31 MED ORDER — ACETAMINOPHEN 10 MG/ML IV SOLN
INTRAVENOUS | Status: DC | PRN
  Administered 2015-07-31: 1000 mg via INTRAVENOUS

## 2015-07-31 MED ORDER — LACTATED RINGERS IV SOLN
INTRAVENOUS | Status: DC
Start: 2015-07-31 — End: 2015-07-31
  Administered 2015-07-31 (×2): via INTRAVENOUS

## 2015-07-31 MED ORDER — MENTHOL 3 MG MT LOZG: 1.0000 | LOZENGE | OROMUCOSAL | Status: DC | PRN

## 2015-07-31 MED ORDER — CEFAZOLIN SODIUM-DEXTROSE 2-3 GM-% IV SOLR
2.0000 g | Freq: Four times a day (QID) | INTRAVENOUS | Status: AC
  Administered 2015-07-31 – 2015-08-01 (×3): 2 g via INTRAVENOUS
  Filled 2015-07-31 (×4): qty 50

## 2015-07-31 MED ORDER — KETAMINE HCL 10 MG/ML IJ SOLN
INTRAMUSCULAR | Status: DC | PRN
  Administered 2015-07-31 (×2): 20 mg via INTRAVENOUS
  Administered 2015-07-31: 10 mg via INTRAVENOUS

## 2015-07-31 MED ORDER — BISACODYL 10 MG RE SUPP
10.0000 mg | Freq: Every day | RECTAL | Status: DC | PRN
  Administered 2015-08-02: 10 mg via RECTAL
  Filled 2015-07-31: qty 1

## 2015-07-31 MED ORDER — FENTANYL CITRATE (PF) 100 MCG/2ML IJ SOLN: 25.0000 ug | INTRAMUSCULAR | Status: DC | PRN

## 2015-07-31 MED ORDER — BUPIVACAINE LIPOSOME 1.3 % IJ SUSP
INTRAMUSCULAR | Status: AC
Start: 2015-07-31 — End: 2015-07-31
  Filled 2015-07-31: qty 20

## 2015-07-31 MED ORDER — ACETAMINOPHEN 10 MG/ML IV SOLN
1000.0000 mg | Freq: Four times a day (QID) | INTRAVENOUS | Status: AC
  Administered 2015-07-31 – 2015-08-01 (×3): 1000 mg via INTRAVENOUS
  Filled 2015-07-31 (×4): qty 100

## 2015-07-31 MED ORDER — TRAMADOL HCL 50 MG PO TABS: 50.0000 mg | ORAL_TABLET | ORAL | Status: DC | PRN

## 2015-07-31 MED ORDER — ONDANSETRON HCL 4 MG/2ML IJ SOLN
INTRAMUSCULAR | Status: DC | PRN
  Administered 2015-07-31: 4 mg via INTRAVENOUS

## 2015-07-31 MED ORDER — ONDANSETRON HCL 4 MG/2ML IJ SOLN: 4.0000 mg | Freq: Four times a day (QID) | INTRAMUSCULAR | Status: DC | PRN

## 2015-07-31 MED ORDER — PRAVASTATIN SODIUM 20 MG PO TABS
20.0000 mg | ORAL_TABLET | Freq: Every day | ORAL | Status: DC
  Administered 2015-08-01: 20 mg via ORAL
  Filled 2015-07-31: qty 1

## 2015-07-31 MED ORDER — BUPIVACAINE-EPINEPHRINE (PF) 0.25% -1:200000 IJ SOLN
INTRAMUSCULAR | Status: AC
  Filled 2015-07-31: qty 30

## 2015-07-31 MED ORDER — RIVAROXABAN 10 MG PO TABS
20.0000 mg | ORAL_TABLET | Freq: Every day | ORAL | Status: DC
  Administered 2015-07-31 – 2015-08-01 (×2): 20 mg via ORAL
  Filled 2015-07-31 (×3): qty 2

## 2015-07-31 MED ORDER — SODIUM CHLORIDE 0.9 % IV SOLN: Freq: Once | INTRAVENOUS | Status: DC

## 2015-07-31 MED ORDER — SODIUM CHLORIDE 0.9 % IV SOLN
INTRAVENOUS | Status: DC
  Administered 2015-07-31 – 2015-08-01 (×3): via INTRAVENOUS

## 2015-07-31 MED ORDER — FLEET ENEMA 7-19 GM/118ML RE ENEM: 1.0000 | ENEMA | Freq: Once | RECTAL | Status: DC | PRN

## 2015-07-31 MED ORDER — ACETAMINOPHEN 650 MG RE SUPP: 650.0000 mg | Freq: Four times a day (QID) | RECTAL | Status: DC | PRN

## 2015-07-31 MED ORDER — FERROUS SULFATE 325 (65 FE) MG PO TABS
325.0000 mg | ORAL_TABLET | Freq: Two times a day (BID) | ORAL | Status: DC
  Administered 2015-08-01 – 2015-08-02 (×3): 325 mg via ORAL
  Filled 2015-07-31 (×3): qty 1

## 2015-07-31 MED ORDER — BENAZEPRIL HCL 20 MG PO TABS
20.0000 mg | ORAL_TABLET | Freq: Every day | ORAL | Status: DC
  Administered 2015-08-01: 20 mg via ORAL
  Filled 2015-07-31 (×2): qty 1

## 2015-07-31 MED ORDER — PHENOL 1.4 % MT LIQD: 1.0000 | OROMUCOSAL | Status: DC | PRN

## 2015-07-31 MED ORDER — FAMOTIDINE 20 MG PO TABS
ORAL_TABLET | ORAL | Status: AC
  Filled 2015-07-31: qty 1

## 2015-07-31 MED ORDER — NEOMYCIN-POLYMYXIN B GU 40-200000 IR SOLN
Status: AC
  Filled 2015-07-31: qty 20

## 2015-07-31 MED ORDER — ACETAMINOPHEN 325 MG PO TABS: 650.0000 mg | ORAL_TABLET | Freq: Four times a day (QID) | ORAL | Status: DC | PRN

## 2015-07-31 MED ORDER — SENNOSIDES-DOCUSATE SODIUM 8.6-50 MG PO TABS
1.0000 | ORAL_TABLET | Freq: Two times a day (BID) | ORAL | Status: DC
  Administered 2015-07-31 – 2015-08-02 (×4): 1 via ORAL
  Filled 2015-07-31 (×4): qty 1

## 2015-07-31 MED ORDER — ACETAMINOPHEN 10 MG/ML IV SOLN
INTRAVENOUS | Status: AC
  Filled 2015-07-31: qty 100

## 2015-07-31 MED ORDER — ATENOLOL 50 MG PO TABS
50.0000 mg | ORAL_TABLET | Freq: Every day | ORAL | Status: DC
  Administered 2015-08-01 – 2015-08-02 (×2): 50 mg via ORAL
  Filled 2015-07-31 (×2): qty 1

## 2015-07-31 MED ORDER — PHENYLEPHRINE HCL 10 MG/ML IJ SOLN
INTRAMUSCULAR | Status: DC | PRN
  Administered 2015-07-31: 100 ug via INTRAVENOUS

## 2015-07-31 MED ORDER — OXYCODONE HCL 5 MG PO TABS
5.0000 mg | ORAL_TABLET | ORAL | Status: DC | PRN
  Administered 2015-07-31: 5 mg via ORAL
  Administered 2015-08-01: 10 mg via ORAL
  Administered 2015-08-01 – 2015-08-02 (×4): 5 mg via ORAL
  Filled 2015-07-31: qty 1
  Filled 2015-07-31: qty 2
  Filled 2015-07-31 (×4): qty 1

## 2015-07-31 MED ORDER — FENTANYL CITRATE (PF) 100 MCG/2ML IJ SOLN
INTRAMUSCULAR | Status: DC | PRN
  Administered 2015-07-31 (×2): 25 ug via INTRAVENOUS
  Administered 2015-07-31: 50 ug via INTRAVENOUS
  Administered 2015-07-31 (×2): 25 ug via INTRAVENOUS
  Administered 2015-07-31: 50 ug via INTRAVENOUS

## 2015-07-31 MED ORDER — MAGNESIUM HYDROXIDE 400 MG/5ML PO SUSP
30.0000 mL | Freq: Every day | ORAL | Status: DC | PRN
  Administered 2015-08-01: 30 mL via ORAL
  Filled 2015-07-31: qty 30

## 2015-07-31 MED ORDER — DEXAMETHASONE SODIUM PHOSPHATE 10 MG/ML IJ SOLN
INTRAMUSCULAR | Status: DC | PRN
  Administered 2015-07-31: 10 mg via INTRAVENOUS

## 2015-07-31 MED ORDER — MORPHINE SULFATE (PF) 2 MG/ML IV SOLN
2.0000 mg | INTRAVENOUS | Status: DC | PRN
  Administered 2015-07-31: 2 mg via INTRAVENOUS
  Filled 2015-07-31: qty 1

## 2015-07-31 MED ORDER — BENAZEPRIL-HYDROCHLOROTHIAZIDE 20-25 MG PO TABS: 1.0000 | ORAL_TABLET | Freq: Every day | ORAL | Status: DC

## 2015-07-31 SURGICAL SUPPLY — 58 items
AUTOTRANSFUS HAS 1/8 (MISCELLANEOUS) ×3
BATTERY INSTRU NAVIGATION (MISCELLANEOUS) ×12 IMPLANT
BLADE SAW 1 (BLADE) ×3 IMPLANT
BLADE SAW 1/2 (BLADE) ×3 IMPLANT
BONE CEMENT GENTAMICIN (Cement) ×6 IMPLANT
CANISTER SUCT 1200ML W/VALVE (MISCELLANEOUS) ×3 IMPLANT
CANISTER SUCT 3000ML (MISCELLANEOUS) ×6 IMPLANT
CAP KNEE TOTAL 3 SIGMA ×3 IMPLANT
CATH TRAY METER 16FR LF (MISCELLANEOUS) ×3 IMPLANT
CEMENT BONE GENTAMICIN 40 (Cement) ×2 IMPLANT
COOLER POLAR GLACIER W/PUMP (MISCELLANEOUS) ×3 IMPLANT
DECANTER SPIKE VIAL GLASS SM (MISCELLANEOUS) ×6 IMPLANT
DRAPE SHEET LG 3/4 BI-LAMINATE (DRAPES) ×3 IMPLANT
DRSG DERMACEA 8X12 NADH (GAUZE/BANDAGES/DRESSINGS) ×3 IMPLANT
DRSG OPSITE POSTOP 4X14 (GAUZE/BANDAGES/DRESSINGS) ×3 IMPLANT
DRSG TEGADERM 4X4.75 (GAUZE/BANDAGES/DRESSINGS) ×3 IMPLANT
DURAPREP 26ML APPLICATOR (WOUND CARE) ×6 IMPLANT
ELECT CAUTERY BLADE 6.4 (BLADE) ×3 IMPLANT
EX-PIN ORTHOLOCK NAV 4X150 (PIN) ×6 IMPLANT
GLOVE BIOGEL M STRL SZ7.5 (GLOVE) ×6 IMPLANT
GLOVE INDICATOR 8.0 STRL GRN (GLOVE) ×3 IMPLANT
GLOVE SURG 9.0 ORTHO LTXF (GLOVE) ×3 IMPLANT
GLOVE SURG ORTHO 9.0 STRL STRW (GLOVE) ×3 IMPLANT
GOWN STRL REUS W/ TWL LRG LVL3 (GOWN DISPOSABLE) ×2 IMPLANT
GOWN STRL REUS W/ TWL LRG LVL4 (GOWN DISPOSABLE) ×1 IMPLANT
GOWN STRL REUS W/TWL 2XL LVL3 (GOWN DISPOSABLE) ×3 IMPLANT
GOWN STRL REUS W/TWL LRG LVL3 (GOWN DISPOSABLE) ×4
GOWN STRL REUS W/TWL LRG LVL4 (GOWN DISPOSABLE) ×2
HANDPIECE SUCTION TUBG SURGILV (MISCELLANEOUS) ×3 IMPLANT
HOLDER FOLEY CATH W/STRAP (MISCELLANEOUS) ×3 IMPLANT
HOOD PEEL AWAY FLYTE STAYCOOL (MISCELLANEOUS) ×6 IMPLANT
KIT RM TURNOVER STRD PROC AR (KITS) ×3 IMPLANT
KNIFE SCULPS 14X20 (INSTRUMENTS) ×3 IMPLANT
NDL SAFETY 18GX1.5 (NEEDLE) ×3 IMPLANT
NEEDLE SPNL 20GX3.5 QUINCKE YW (NEEDLE) ×3 IMPLANT
NS IRRIG 500ML POUR BTL (IV SOLUTION) ×3 IMPLANT
PACK TOTAL KNEE (MISCELLANEOUS) ×3 IMPLANT
PAD GROUND ADULT SPLIT (MISCELLANEOUS) ×3 IMPLANT
PAD WRAPON POLAR KNEE (MISCELLANEOUS) ×1 IMPLANT
PIN DRILL QUICK PACK ×3 IMPLANT
PIN FIXATION 1/8DIA X 3INL (PIN) ×3 IMPLANT
SOL .9 NS 3000ML IRR  AL (IV SOLUTION) ×2
SOL .9 NS 3000ML IRR UROMATIC (IV SOLUTION) ×1 IMPLANT
SOL PREP PVP 2OZ (MISCELLANEOUS) ×3
SOLUTION PREP PVP 2OZ (MISCELLANEOUS) ×1 IMPLANT
SPONGE DRAIN TRACH 4X4 STRL 2S (GAUZE/BANDAGES/DRESSINGS) ×3 IMPLANT
STAPLER SKIN PROX 35W (STAPLE) ×3 IMPLANT
SUCTION FRAZIER TIP 10 FR DISP (SUCTIONS) ×3 IMPLANT
SUT VIC AB 0 CT1 36 (SUTURE) ×3 IMPLANT
SUT VIC AB 1 CT1 36 (SUTURE) ×6 IMPLANT
SUT VIC AB 2-0 CT2 27 (SUTURE) ×3 IMPLANT
SYR 20CC LL (SYRINGE) ×3 IMPLANT
SYR 30ML LL (SYRINGE) ×3 IMPLANT
SYR 50ML LL SCALE MARK (SYRINGE) ×3 IMPLANT
SYSTEM AUTOTRANSFUS DUAL TROCR (MISCELLANEOUS) ×1 IMPLANT
TOWEL OR 17X26 4PK STRL BLUE (TOWEL DISPOSABLE) ×3 IMPLANT
TOWER CARTRIDGE SMART MIX (DISPOSABLE) ×3 IMPLANT
WRAPON POLAR PAD KNEE (MISCELLANEOUS) ×3

## 2015-07-31 NOTE — Op Note (Signed)
OPERATIVE NOTE  DATE OF SURGERY:  07/31/2015  PATIENT NAME:  Hector Bennett   DOB: 1946-03-14  MRN: [id number]  PRE-OPERATIVE DIAGNOSIS: Degenerative arthrosis of the left knee, primary  POST-OPERATIVE DIAGNOSIS:  Same  PROCEDURE:  Left total knee arthroplasty using computer-assisted navigation  SURGEON:  Marciano Sequin. M.D.  ASSISTANT:  Vance Peper, PA (present and scrubbed throughout the case, critical for assistance with exposure, retraction, instrumentation, and closure)  ANESTHESIA: spinal  ESTIMATED BLOOD LOSS: 150 mL  FLUIDS REPLACED: 1300 mL of crystalloid  TOURNIQUET TIME: 88 minutes  DRAINS: 2 medium drains to a reinfusion system  SOFT TISSUE RELEASES: Anterior cruciate ligament, posterior cruciate ligament, deep medial collateral ligament, patellofemoral ligament   IMPLANTS UTILIZED: DePuy PFC Sigma size 5 posterior stabilized femoral component (cemented), size 5 MBT tibial component (cemented), 41 mm 3 peg oval dome patella (cemented), and a 12.5 mm stabilized rotating platform polyethylene insert.  INDICATIONS FOR SURGERY: Hector Bennett is a 69 y.o. year old male with a long history of progressive knee pain. X-rays demonstrated severe degenerative changes in tricompartmental fashion. The patient had not seen any significant improvement despite conservative nonsurgical intervention. After discussion of the risks and benefits of surgical intervention, the patient expressed understanding of the risks benefits and agree with plans for total knee arthroplasty.   The risks, benefits, and alternatives were discussed at length including but not limited to the risks of infection, bleeding, nerve injury, stiffness, blood clots, the need for revision surgery, cardiopulmonary complications, among others, and they were willing to proceed.  PROCEDURE IN DETAIL: The patient was brought into the operating room and, after adequate spinal anesthesia was achieved, a tourniquet was  placed on the patient's upper thigh. The patient's knee and leg were cleaned and prepped with alcohol and DuraPrep and draped in the usual sterile fashion. A "timeout" was performed as per usual protocol. The lower extremity was exsanguinated using an Esmarch, and the tourniquet was inflated to 300 mmHg. An anterior longitudinal incision was made followed by a standard mid vastus approach. The deep fibers of the medial collateral ligament were elevated in a subperiosteal fashion off of the medial flare of the tibia so as to maintain a continuous soft tissue sleeve. The patella was subluxed laterally and the patellofemoral ligament was incised. Inspection of the knee demonstrated severe degenerative changes with full-thickness loss of articular cartilage. Osteophytes were debrided using a rongeur. Anterior and posterior cruciate ligaments were excised. Two 4.0 mm Schanz pins were inserted in the femur and into the tibia for attachment of the array of trackers used for computer-assisted navigation. Hip center was identified using a circumduction technique. Distal landmarks were mapped using the computer. The distal femur and proximal tibia were mapped using the computer. The distal femoral cutting guide was positioned using computer-assisted navigation so as to achieve a 5 distal valgus cut. The femur was sized and it was felt that a size 5 femoral component was appropriate. A size 5 femoral cutting guide was positioned and the anterior cut was performed and verified using the computer. This was followed by completion of the posterior and chamfer cuts. Femoral cutting guide for the central box was then positioned in the center box cut was performed.  Attention was then directed to the proximal tibia. Medial and lateral menisci were excised. The extramedullary tibial cutting guide was positioned using computer-assisted navigation so as to achieve a 0 varus-valgus alignment and 0 posterior slope. The cut was  performed and verified  using the computer. The proximal tibia was sized and it was felt that a size 5 tibial tray was appropriate. Tibial and femoral trials were inserted followed by insertion of first a 10 and then 12.5 mm polyethylene insert. This allowed for excellent mediolateral soft tissue balancing both in flexion and in full extension. Finally, the patella was cut and prepared so as to accommodate a 41 mm 3 peg oval dome patella. A patella trial was placed and the knee was placed through a range of motion with excellent patellar tracking appreciated. The femoral trial was removed after debridement of posterior osteophytes. The central post-hole for the tibial component was reamed followed by insertion of a keel punch. Tibial trials were then removed. Cut surfaces of bone were irrigated with copious amounts of normal saline with antibiotic solution using pulsatile lavage and then suctioned dry. Polymethylmethacrylate cement with gentamicin was prepared in the usual fashion using a vacuum mixer. Cement was applied to the cut surface of the proximal tibia as well as along the undersurface of a size 5 MBT tibial component. Tibial component was positioned and impacted into place. Excess cement was removed using [employer] fast streamer. Cement was then applied to the cut surfaces of the femur as well as along the posterior flanges of the size 5 femoral component. The femoral component was positioned and impacted into place. Excess cement was removed using [employer] fast streamer. A 12.5 mm polyethylene trial was inserted and the knee was brought into full extension with steady axial compression applied. Finally, cement was applied to the backside of a 41 mm 3 peg oval dome patella and the patellar component was positioned and patellar clamp applied. Excess cement was removed using [employer] fast streamer. After adequate curing of the cement, the tourniquet was deflated after a total tourniquet time of 88 minutes. Hemostasis was achieved  using electrocautery. The knee was irrigated with copious amounts of normal saline with antibiotic solution using pulsatile lavage and then suctioned dry. 20 mL of 1.3% Exparel in 40 mL of normal saline was injected along the posterior capsule, medial and lateral gutters, and along the arthrotomy site. A 12.5 mm stabilized rotating platform polyethylene insert was inserted and the knee was placed through a range of motion with excellent mediolateral soft tissue balancing appreciated and excellent patellar tracking noted. 2 medium drains were placed in the wound bed and brought out through separate stab incisions to be attached to a reinfusion system. The medial parapatellar portion of the incision was reapproximated using interrupted sutures of #1 Vicryl. Subcutaneous tissue was then injected with a total of 30 cc of 0.25% Marcaine with epinephrine. Subcutaneous tissue was approximated in layers using first #0 Vicryl followed #2-0 Vicryl. The skin was approximated with skin staples. A sterile dressing was applied.  The patient tolerated the procedure well and was transported to the recovery room in stable condition.    James P. Holley Bouche., M.D.

## 2015-07-31 NOTE — Anesthesia Procedure Notes (Signed)
Spinal Patient location during procedure: OR Staffing Performed by: anesthesiologist  Preanesthetic Checklist Completed: patient identified, site marked, surgical consent, pre-op evaluation, timeout performed, IV checked, risks and benefits discussed and monitors and equipment checked Spinal Block Patient position: sitting Prep: Betadine Patient monitoring: heart rate, continuous pulse ox, blood pressure and cardiac monitor Approach: midline Location: L4-5 Injection technique: single-shot Needle Needle type: Whitacre and Introducer  Needle gauge: 24 G Needle length: 9 cm Assessment Sensory level: T4 Additional Notes Negative paresthesia. Negative blood return. Positive free-flowing CSF. Expiration date of kit checked and confirmed. Patient tolerated procedure well, without complications.     

## 2015-07-31 NOTE — Transfer of Care (Signed)
Immediate Anesthesia Transfer of Care Note  Patient: Hector Bennett  Procedure(s) Performed: Procedure(s): COMPUTER ASSISTED TOTAL KNEE ARTHROPLASTY (Left)  Patient Location: PACU  Anesthesia Type:General and Spinal  Level of Consciousness: awake, alert , oriented and patient cooperative  Airway & Oxygen Therapy: Patient Spontanous Breathing  Post-op Assessment: Report given to RN and Post -op Vital signs reviewed and stable  Post vital signs: Reviewed and stable  Last Vitals:  Filed Vitals:   07/31/15 0907 07/31/15 1517  BP: 123/67 96/63  Pulse: 63 80  Temp: 36.7 C 36.8 C  Resp: 16     Complications: No apparent anesthesia complications

## 2015-07-31 NOTE — Anesthesia Preprocedure Evaluation (Signed)
Anesthesia Evaluation  Patient identified by MRN, date of birth, ID band Patient awake    Reviewed: Allergy & Precautions, H&P , NPO status , Patient's Chart, lab work & pertinent test results, reviewed documented beta blocker date and time   Airway Mallampati: II  TM Distance: >3 FB Neck ROM: full    Dental no notable dental hx.    Pulmonary neg pulmonary ROS,    Pulmonary exam normal breath sounds clear to auscultation       Cardiovascular Exercise Tolerance: Good hypertension, negative cardio ROS   Rhythm:regular Rate:Normal  Hx of dvt and pe.  Off blood thinners   Neuro/Psych negative neurological ROS  negative psych ROS   GI/Hepatic negative GI ROS, Neg liver ROS,   Endo/Other  negative endocrine ROSMorbid obesity  Renal/GU negative Renal ROS  negative genitourinary   Musculoskeletal   Abdominal   Peds  Hematology negative hematology ROS (+)   Anesthesia Other Findings   Reproductive/Obstetrics negative OB ROS                             Anesthesia Physical Anesthesia Plan  ASA: III  Anesthesia Plan: General and Spinal   Post-op Pain Management:    Induction:   Airway Management Planned:   Additional Equipment:   Intra-op Plan:   Post-operative Plan:   Informed Consent: I have reviewed the patients History and Physical, chart, labs and discussed the procedure including the risks, benefits and alternatives for the proposed anesthesia with the patient or authorized representative who has indicated his/her understanding and acceptance.   Dental Advisory Given  Plan Discussed with: CRNA  Anesthesia Plan Comments:         Anesthesia Quick Evaluation

## 2015-07-31 NOTE — H&P (Signed)
The patient has been re-examined, and the chart reviewed, and there have been no interval changes to the documented history and physical.    The risks, benefits, and alternatives have been discussed at length. The patient expressed understanding of the risks benefits and agreed with plans for surgical intervention.  James P. Hooten, Jr. M.D.    

## 2015-07-31 NOTE — Anesthesia Preprocedure Evaluation (Deleted)

## 2015-07-31 NOTE — Brief Op Note (Signed)
07/31/2015  3:24 PM  PATIENT:  Hector Bennett  69 y.o. male  PRE-OPERATIVE DIAGNOSIS:  DEGENERATIVE OSTEOARTHRITIS of the left knee  POST-OPERATIVE DIAGNOSIS:  Same  PROCEDURE:  Procedure(s): COMPUTER ASSISTED TOTAL KNEE ARTHROPLASTY (Left)  SURGEON:  Surgeon(s) and Role:    * Dereck Leep, MD - Primary  ASSISTANTS: Vance Peper, PA    ANESTHESIA:   spinal  EBL:  Total I/O In: 1300 [I.V.:1300] Out: 500 [Urine:350; Blood:150]  BLOOD ADMINISTERED:none  DRAINS: 2 medium drains to a reinfusion system   LOCAL MEDICATIONS USED:  MARCAINE    and OTHER Exparel  SPECIMEN:  No Specimen  DISPOSITION OF SPECIMEN:  N/A  COUNTS:  YES  TOURNIQUET:   88 minutes  DICTATION: .Dragon Dictation  PLAN OF CARE: Admit to inpatient   PATIENT DISPOSITION:  PACU - hemodynamically stable.   Delay start of Pharmacological VTE agent (>24hrs) due to surgical blood loss or risk of bleeding: yes

## 2015-08-01 ENCOUNTER — Encounter: Payer: Self-pay | Admitting: Orthopedic Surgery

## 2015-08-01 LAB — BASIC METABOLIC PANEL
ANION GAP: 6 (ref 5–15)
BUN: 15 mg/dL (ref 6–20)
CO2: 23 mmol/L (ref 22–32)
Calcium: 8.4 mg/dL — ABNORMAL LOW (ref 8.9–10.3)
Chloride: 105 mmol/L (ref 101–111)
Creatinine, Ser: 0.98 mg/dL (ref 0.61–1.24)
GFR calc Af Amer: >60 mL/min (ref >60)
GLUCOSE: 159 mg/dL — AB (ref 65–99)
POTASSIUM: 3.8 mmol/L (ref 3.5–5.1)
Sodium: 134 mmol/L — ABNORMAL LOW (ref 135–145)

## 2015-08-01 LAB — CBC
HCT: 39 % — ABNORMAL LOW (ref 40.0–52.0)
Hemoglobin: 13 g/dL (ref 13.0–18.0)
MCH: 30.9 pg (ref 26.0–34.0)
MCHC: 33.4 g/dL (ref 32.0–36.0)
MCV: 92.5 fL (ref 80.0–100.0)
PLATELETS: 220 10*3/uL (ref 150–440)
RBC: 4.22 MIL/uL — AB (ref 4.40–5.90)
RDW: 13.8 % (ref 11.5–14.5)
WBC: 12.6 10*3/uL — AB (ref 3.8–10.6)

## 2015-08-01 MED ORDER — TRAMADOL HCL 50 MG PO TABS: 50.0000 mg | ORAL_TABLET | ORAL | Status: DC | PRN

## 2015-08-01 MED ORDER — SODIUM CHLORIDE 0.9 % IV BOLUS (SEPSIS)
500.0000 mL | Freq: Once | INTRAVENOUS | Status: AC
  Administered 2015-08-01: 500 mL via INTRAVENOUS

## 2015-08-01 MED ORDER — OXYCODONE HCL 5 MG PO TABS: 5.0000 mg | ORAL_TABLET | ORAL | Status: DC | PRN

## 2015-08-01 NOTE — Progress Notes (Signed)
Dr Marry Guan called back, patients bp low, now 82/44, ordered 500cc ns bolus.

## 2015-08-01 NOTE — Evaluation (Signed)
Occupational Therapy Evaluation Patient Details Name: Hector Bennett MRN: [id number] DOB: 04-27-1946 Today's Date: 08/01/2015    History of Present Illness Pt is a 69 y.o. male s/p L TKA 07/31/15.  PMH includes htn, B PE, DVT, a-fib, and s/p IVC filter insertion 07/25/15.   Clinical Impression   This patient is a 69 year old male who came to [facility] for a L total knee replacement.  Patient lives in a one story home with with a basement.  He had been independent with ADL and functional mobility and works part time. He now requires some assist although does well.  He would benefit from Occupational Therapy as needed for ADL/functioal mobility training.      Follow Up Recommendations       Equipment Recommendations       Recommendations for Other Services       Precautions / Restrictions Precautions Precautions: Fall;Knee Precaution Booklet Issued: Yes (comment) (per PT) Restrictions Weight Bearing Restrictions: Yes LLE Weight Bearing: Weight bearing as tolerated      Mobility Bed Mobility  Transfers          Balance                              ADL                                         General ADL Comments: Had been independent with ADL and working part time. He did perform Donned/doffed socks and pants to knees (drain still in place) and did not need assistive devices but did need cues for techniques.     Vision     Perception     Praxis      Pertinent Vitals/Pain Pain Assessment: 0-10 Pain Score: 3  Pain Location: L knee Pain Descriptors / Indicators: Sore;Tender Pain Intervention(s): Limited activity within patient's tolerance;Monitored during session;Premedicated before session;Repositioned     Hand Dominance     Extremity/Trunk Assessment Upper Extremity Assessment Upper Extremity Assessment: Overall WFL for tasks assessed   Lower Extremity Assessment Lower Extremity Assessment: Defer  to PT evaluation RLE Deficits / Details: strength and ROM WFL LLE Deficits / Details: L hip flexion, knee flexion/extension, and DF at least 3+/5; pt able to perform x10 SLR independently LLE: Unable to fully assess due to pain       Communication Communication Communication: HOH   Cognition Arousal/Alertness: Awake/alert Behavior During Therapy: WFL for tasks assessed/performed Overall Cognitive Status: Within Functional Limits for tasks assessed                     General Comments       Exercises       Shoulder Instructions      Home Living Family/patient expects to be discharged to:: Private residence Living Arrangements: Spouse/significant other   Type of Home: House Home Access: Stairs to enter Technical brewer of Steps: 1 Entrance Stairs-Rails: None Home Layout: One level (with a basement)               Home Equipment: Cane - single point;Bedside commode          Prior Functioning/Environment Level of Independence: Independent             OT Diagnosis: Acute pain   OT Problem List: Decreased activity tolerance;Pain  OT Treatment/Interventions: Self-care/ADL training    OT Goals(Current goals can be found in the care plan section) Acute Rehab OT Goals Patient Stated Goal: to go home OT Goal Formulation: With patient Time For Goal Achievement: 08/15/15 Potential to Achieve Goals: Good  OT Frequency: Min 1X/week   Barriers to D/C:            Co-evaluation              End of Session    Activity Tolerance: Patient tolerated treatment well Patient left: in chair;with call bell/phone within reach;with chair alarm set   Time: [id number] OT Time Calculation (min): 24 min Charges:  OT General Charges $OT Visit: 1 Procedure OT Evaluation $Initial OT Evaluation Tier I: 1 Procedure OT Treatments $Self Care/Home Management : 8-22 mins G-Codes:    Myrene Galas, MS/OTR/L   08/01/2015, 10:29 AM

## 2015-08-01 NOTE — Evaluation (Signed)
Physical Therapy Evaluation Patient Details Name: Hector Bennett MRN: [id number] DOB: 02/26/1946 Today's Date: 08/01/2015   History of Present Illness  Pt is a 69 y.o. male s/p L TKA 07/31/15.  PMH includes htn, B PE, DVT, a-fib, and s/p IVC filter insertion 07/25/15.  Clinical Impression  Prior to admission, pt was independent without AD.  Pt lives with his wife on main level of home with 1 step to enter without railing.  Currently pt is SBA supine to sit; min assist to stand with RW; CGA to ambulate 40 feet with RW.  Pt would benefit from skilled PT to address noted impairments and functional limitations.  Recommend pt discharge to home with HHPT and support of family when medically appropriate.     Follow Up Recommendations Home health PT (assist for stairs)    Equipment Recommendations  Rolling walker with 5" wheels    Recommendations for Other Services       Precautions / Restrictions Precautions Precautions: Fall;Knee Precaution Booklet Issued: Yes (comment) Restrictions Weight Bearing Restrictions: Yes LLE Weight Bearing: Weight bearing as tolerated      Mobility  Bed Mobility Overal bed mobility: Needs Assistance Bed Mobility: Supine to Sit     Supine to sit: Supervision;HOB elevated     General bed mobility comments: use of siderail; increased time to perform on his own  Transfers Overall transfer level: Needs assistance Equipment used: Rolling walker (2 wheeled) Transfers: Sit to/from Stand Sit to Stand: Min assist         General transfer comment: assist to initiate stand; vc's required for hand and feet placement  Ambulation/Gait Ambulation/Gait assistance: Min guard Ambulation Distance (Feet): 40 Feet Assistive device: Rolling walker (2 wheeled)   Gait velocity: decreased   General Gait Details: antalgic; decreased stance time L LE; vc's required for gait technique and walker use  Stairs            Wheelchair Mobility    Modified  Rankin (Stroke Patients Only)       Balance Overall balance assessment: Needs assistance Sitting-balance support: Bilateral upper extremity supported;Feet supported Sitting balance-Leahy Scale: Good     Standing balance support: Bilateral upper extremity supported (on RW) Standing balance-Leahy Scale: Good                               Pertinent Vitals/Pain Pain Assessment: 0-10 Pain Score:  (2/10 at rest; 4/10 with activity) Pain Location: L knee Pain Descriptors / Indicators: Sore;Tender Pain Intervention(s): Limited activity within patient's tolerance;Monitored during session;Premedicated before session;Repositioned  Vitals stable and WFL throughout treatment session.    Home Living Family/patient expects to be discharged to:: Private residence Living Arrangements: Spouse/significant other   Type of Home: House Home Access: Stairs to enter Entrance Stairs-Rails: None Entrance Stairs-Number of Steps: 1 Home Layout: One level (with basement) Home Equipment: Cane - single point;Bedside commode      Prior Function Level of Independence: Independent               Hand Dominance        Extremity/Trunk Assessment   Upper Extremity Assessment: Overall WFL for tasks assessed           Lower Extremity Assessment: RLE deficits/detail;LLE deficits/detail RLE Deficits / Details: strength and ROM WFL LLE Deficits / Details: L hip flexion, knee flexion/extension, and DF at least 3+/5; pt able to perform x10 SLR independently     Communication  Communication: HOH  Cognition Arousal/Alertness: Awake/alert Behavior During Therapy: WFL for tasks assessed/performed Overall Cognitive Status: Within Functional Limits for tasks assessed                      General Comments General comments (skin integrity, edema, etc.): L knee dressings in place  Nursing cleared pt for participation in physical therapy.  Pt agreeable to PT session.     Exercises Total Joint Exercises Goniometric ROM: L knee extension 14 degrees short of neutral semi-supine; L knee flexion 85 degrees in sitting  Performed semi-supine B LE therapeutic exercise x 10 reps:  Ankle pumps (AROM B LE's); quad sets x3 second holds (AROM B LE's); SAQ's (AROM R; AROM L); heelslides (AROM R; AROM L), hip abd/adduction (AROM R; AROM L), and SLR (AROM R; AROM L).  Pt required vc's and tactile cues for correct technique with exercises.       Assessment/Plan    PT Assessment Patient needs continued PT services  PT Diagnosis Difficulty walking;Acute pain   PT Problem List Decreased strength;Decreased range of motion;Decreased activity tolerance;Decreased balance;Decreased mobility;Decreased knowledge of use of DME;Decreased knowledge of precautions;Pain  PT Treatment Interventions DME instruction;Gait training;Stair training;Functional mobility training;Therapeutic activities;Therapeutic exercise;Balance training;Patient/family education;Manual techniques   PT Goals (Current goals can be found in the Care Plan section) Acute Rehab PT Goals Patient Stated Goal: to go home PT Goal Formulation: With patient Time For Goal Achievement: 08/15/15 Potential to Achieve Goals: Good    Frequency BID   Barriers to discharge        Co-evaluation               End of Session Equipment Utilized During Treatment: Gait belt Activity Tolerance: Patient tolerated treatment well Patient left: in chair;with call bell/phone within reach;with chair alarm set (OT present and reported he would attach pt's polar care and SCD's when finished; B heels elevated on towel rolls) Nurse Communication: Mobility status         Time: [id number] PT Time Calculation (min) (ACUTE ONLY): 32 min   Charges:   PT Evaluation $Initial PT Evaluation Tier I: 1 Procedure PT Treatments $Therapeutic Exercise: 8-22 mins   PT G CodesLeitha Bleak 14-Aug-2015, 10:21 AM Leitha Bleak, [place]

## 2015-08-01 NOTE — Discharge Instructions (Signed)

## 2015-08-01 NOTE — Progress Notes (Signed)
Physical Therapy Treatment Patient Details Name: Hector Bennett MRN: [id number] DOB: 12-29-1945 Today's Date: 08/01/2015    History of Present Illness Pt is a 69 y.o. male s/p L TKA 07/31/15.  PMH includes htn, B PE, DVT, a-fib, and s/p IVC filter insertion 07/25/15.    PT Comments    Pt able to progress to ambulating around nursing loop CGA with RW.  Pain 2-4/10 L knee throughout session.  Pt progressing well with functional mobility.  Need to trial stairs tomorrow (pt now reports having 2 steps with no railing to enter home).  CM notified pt requiring RW for safe discharge home (pt and pt's daughter report only having cane at home).  Pt refusing 3 in 1 commode (pt reports his toilet height is taller at home so he doesn't need one).     Follow Up Recommendations  Home health PT (assist for stairs)     Equipment Recommendations  Rolling walker with 5" wheels    Recommendations for Other Services       Precautions / Restrictions Precautions Precautions: Fall;Knee Restrictions Weight Bearing Restrictions: Yes LLE Weight Bearing: Weight bearing as tolerated    Mobility  Bed Mobility Overal bed mobility: Needs Assistance Bed Mobility: Sit to Supine       Sit to supine: Supervision;HOB elevated   General bed mobility comments: increased time to perform on his own  Transfers Overall transfer level: Needs assistance Equipment used: Rolling walker (2 wheeled) Transfers: Sit to/from Stand Sit to Stand: Min guard         General transfer comment: increased effort to stand on his own; occasional vc's required for hand and feet placement  Ambulation/Gait Ambulation/Gait assistance: Min guard Ambulation Distance (Feet): 240 Feet Assistive device: Rolling walker (2 wheeled)   Gait velocity: decreased   General Gait Details: mildly antalgic; decreased stance time L LE; occasional vc's required for gait technique and walker use   Stairs            Wheelchair  Mobility    Modified Rankin (Stroke Patients Only)       Balance                                    Cognition Arousal/Alertness: Awake/alert Behavior During Therapy: WFL for tasks assessed/performed Overall Cognitive Status: Within Functional Limits for tasks assessed                      Exercises   Performed sitting exercises x 10 reps B LE's:  LAQ's (AROM R; AROM L); marching/hip flexion (AROM R; AROM L).  Pt required vc's and tactile cues for correct technique with exercises.     General Comments   Nursing cleared pt for participation in physical therapy.  Pt agreeable to PT session. Pt's daughter present beginning of session.      Pertinent Vitals/Pain Pain Assessment: 0-10 Pain Score: 4  (4/10 with activity; 2/10 with rest) Pain Location: L knee Pain Descriptors / Indicators: Sore;Tender Pain Intervention(s): Limited activity within patient's tolerance;Monitored during session;Premedicated before session;Repositioned;Ice applied  Vitals stable and WFL throughout treatment session.    Home Living                      Prior Function            PT Goals (current goals can now be found in the care plan section)  Acute Rehab PT Goals Patient Stated Goal: to go home PT Goal Formulation: With patient Time For Goal Achievement: 08/15/15 Potential to Achieve Goals: Good Progress towards PT goals: Progressing toward goals    Frequency  BID    PT Plan Current plan remains appropriate    Co-evaluation             End of Session Equipment Utilized During Treatment: Gait belt Activity Tolerance: Patient tolerated treatment well Patient left: in bed;with call bell/phone within reach;with bed alarm set;with SCD's reapplied (B heels elevated via towel rolls; polar care in place)     Time: [id number] PT Time Calculation (min) (ACUTE ONLY): 38 min  Charges:  $Gait Training: 8-22 mins $Therapeutic Exercise: 8-22 mins $Therapeutic  Activity: 8-22 mins                    G CodesLeitha Bleak 08/21/15, 2:43 PM Leitha Bleak, [place]

## 2015-08-01 NOTE — Progress Notes (Signed)
Patient POD1  LTK. Plan of care discussed with patient and pain controlled with PRN and scheduled medication. Patient states that he would like to notify nursing staff for pain medication. Patient dangled and tolerated well. Patient teaching on use of incentive spirometer. Patient demonstrates correct use.

## 2015-08-01 NOTE — Care Management Note (Signed)
Case Management Note  Patient Details  Name: Hector Bennett MRN: [id number] Date of Birth: [birth year]  Subjective/Objective:                  Met with patient to discuss discharge planning. He would like to use [facility] for HHPT. He has a rolling walker available to use at home. He denies need for bedside commode. He plans to return home with his wife at discharge. He uses [facility] for Rx. He is on Xarelto prior to this admission.   Action/Plan: List of home health agencies left with patient. Referral called to [facility]. RNCM will continue to follow.   Expected Discharge Date:                  Expected Discharge Plan:     In-House Referral:     Discharge planning Services  CM Consult  Post Acute Care Choice:  Home Health Choice offered to:  Patient  DME Arranged:    DME Agency:     HH Arranged:  PT HH Agency:  [facility]  Status of Service:  In process, will continue to follow  Medicare Important Message Given:    Date Medicare IM Given:    Medicare IM give by:    Date Additional Medicare IM Given:    Additional Medicare Important Message give by:     If discussed at [place] of Stay Meetings, dates discussed:    Additional Comments:  Marshell Garfinkel, RN 08/01/2015, 11:38 AM

## 2015-08-01 NOTE — Care Management (Signed)
Notified by PT that patient's daughter said patient "does not have a rolling walker and will need one". I have requested rolling walker from Will with [facility] and walker has been delivered to patient in this room.

## 2015-08-01 NOTE — Progress Notes (Signed)
Clinical Social Worker (CSW) received SNF consult. PT is recommending home health. RN Case Manager is aware of above. Please reconsult if future social work needs arise. CSW signing off.   Amarah Brossman Morgan, LCSWA [phone] 

## 2015-08-01 NOTE — Anesthesia Postprocedure Evaluation (Signed)
Anesthesia Post Note  Patient: Hector Bennett  Procedure(s) Performed: Procedure(s) (LRB): COMPUTER ASSISTED TOTAL KNEE ARTHROPLASTY (Left)  Patient location during evaluation: PACU Anesthesia Type: Spinal Level of consciousness: oriented and awake and alert Pain management: pain level controlled Vital Signs Assessment: post-procedure vital signs reviewed and stable Respiratory status: spontaneous breathing, respiratory function stable and patient connected to nasal cannula oxygen Cardiovascular status: blood pressure returned to baseline and stable Postop Assessment: no headache and no backache Anesthetic complications: no    Last Vitals:  Filed Vitals:   08/01/15 0529 08/01/15 0838  BP: 126/81 131/74  Pulse: 98 79  Temp: 37 C 36.9 C  Resp: 18 18    Last Pain:  Filed Vitals:   08/01/15 0838  PainSc: 0-No pain                 Molli Barrows

## 2015-08-01 NOTE — Progress Notes (Signed)
Patient's BP low (79/42), Dr Marry Guan paged, waiting on callback.

## 2015-08-01 NOTE — Discharge Summary (Signed)
Physician Discharge Summary  Patient ID: Hector Bennett MRN: [id number] DOB/AGE: 1945/08/30 69 y.o.  Admit date: 07/31/2015 Discharge date:   Admission Diagnoses:  DEGENERATIVE OSTEOARTHRITIS   Discharge Diagnoses: Patient Active Problem List   Diagnosis Date Noted  . Total knee replacement status 07/31/2015  . DVT (deep venous thrombosis) ([place]) 07/11/2015  . Left knee DJD 07/11/2015  . Obesity, Class III, BMI 40-49.9 (morbid obesity) ([place]) 07/11/2015  . A-fib ([place]) 04/20/2015  . Arthritis 04/20/2015  . Hyperlipidemia   . Hypertension     Past Medical History  Diagnosis Date  . Personal history of thrombophlebitis   . Hyperlipidemia   . Hypertension   . A-fib ([place])   . Arthritis   . Pulmonary embolism ([place]) 11/26/2007    Bilateral  . DVT (deep venous thrombosis) ([place])      Transfusion: Autovac transfusions given the first 6 hours postoperatively   Consultants (if any):   case management for home health assistance  Discharged Condition: Improved  Hospital Course: Hector Bennett is an 69 y.o. male who was admitted 07/31/2015 with a diagnosis of degenerative arthrosis left knee and went to the operating room on 07/31/2015 and underwent the above named procedures.    Surgeries:Procedure(s): COMPUTER ASSISTED TOTAL KNEE ARTHROPLASTY on 07/31/2015  PRE-OPERATIVE DIAGNOSIS: Degenerative arthrosis of the left knee, primary  POST-OPERATIVE DIAGNOSIS: Same  PROCEDURE: Left total knee arthroplasty using computer-assisted navigation  SURGEON: Marciano Sequin. M.D.  ASSISTANT: Vance Peper, PA (present and scrubbed throughout the case, critical for assistance with exposure, retraction, instrumentation, and closure)  ANESTHESIA: spinal  ESTIMATED BLOOD LOSS: 150 mL  FLUIDS REPLACED: 1300 mL of crystalloid  TOURNIQUET TIME: 88 minutes  DRAINS: 2 medium drains to a reinfusion system  SOFT TISSUE RELEASES: Anterior cruciate ligament, posterior cruciate ligament,  deep medial collateral ligament, patellofemoral ligament   IMPLANTS UTILIZED: DePuy PFC Sigma size 5 posterior stabilized femoral component (cemented), size 5 MBT tibial component (cemented), 41 mm 3 peg oval dome patella (cemented), and a 12.5 mm stabilized rotating platform polyethylene insert.  INDICATIONS FOR SURGERY: Hector Bennett is a 69 y.o. year old male with a long history of progressive knee pain. X-rays demonstrated severe degenerative changes in tricompartmental fashion. The patient had not seen any significant improvement despite conservative nonsurgical intervention. After discussion of the risks and benefits of surgical intervention, the patient expressed understanding of the risks benefits and agree with plans for total knee arthroplasty.   The risks, benefits, and alternatives were discussed at length including but not limited to the risks of infection, bleeding, nerve injury, stiffness, blood clots, the need for revision surgery, cardiopulmonary complications, among others, and they were willing to proceed. Patient tolerated the surgery well. No complications .Patient was taken to PACU where she was stabilized and then transferred to the orthopedic floor.  Patient started on Xarelto which she was on prior to surgery Foot pumps applied bilaterally at 80 mm hg. Heels elevated off bed with rolled towels. No evidence of DVT. Calves non tender. Negative [place]oman. Physical therapy started on day #1 for gait training and transfer with OT starting on  day #1 for ADL and assisted devices. Patient has done well with therapy. Ambulated 200 feet upon being discharged. Was able to go up 4 steps independently and safely.  Patient's IV IV and Foley were discontinued on day #1 with Hemovac being discontinued on day #2   He was given perioperative antibiotics:  Anti-infectives    Start  Dose/Rate Route Frequency Ordered Stop   07/31/15 1715  ceFAZolin (ANCEF) IVPB 2 g/50 mL premix     2  g 100 mL/hr over 30 Minutes Intravenous Every 6 hours 07/31/15 1711 08/01/15 1714   07/31/15 0500  ceFAZolin (ANCEF) 3 g in dextrose 5 % 50 mL IVPB     3 g 160 mL/hr over 30 Minutes Intravenous  Once 07/31/15 0457 07/31/15 1204    .  He was fitted with AV 1 compression foot pumps devices bilaterally, early ambulation, and TED stockings and instructed on pumps for DVT prophylaxis.  He benefited maximally from the hospital stay and there were no complications.    Recent vital signs:  Filed Vitals:   07/31/15 2218 08/01/15 0529  BP: 128/75 126/81  Pulse: 88 98  Temp: 98 F (36.7 C) 98.6 F (37 C)  Resp: 18 18    Recent laboratory studies:  Lab Results  Component Value Date   HGB 13.0 08/01/2015   HGB 14.8 07/19/2015   Lab Results  Component Value Date   WBC 12.6* 08/01/2015   PLT 220 08/01/2015   Lab Results  Component Value Date   INR 1.66 07/19/2015   Lab Results  Component Value Date   NA 134* 08/01/2015   K 3.8 08/01/2015   CL 105 08/01/2015   CO2 23 08/01/2015   BUN 15 08/01/2015   CREATININE 0.98 08/01/2015   GLUCOSE 159* 08/01/2015    Discharge Medications:     Medication List    STOP taking these medications        RA NAPROXEN SODIUM 220 MG tablet  Generic drug:  naproxen sodium      TAKE these medications        atenolol 50 MG tablet  Commonly known as:  TENORMIN  Take 1 tablet (50 mg total) by mouth daily.     benazepril-hydrochlorthiazide 20-25 MG tablet  Commonly known as:  LOTENSIN HCT  1 Tablet once each day ORAL     glucosamine-chondroitin 500-400 MG tablet  Take 1 tablet by mouth 2 (two) times daily.     lovastatin 20 MG tablet  Commonly known as:  MEVACOR  Take 1 tablet (20 mg total) by mouth at bedtime.     oxyCODONE 5 MG immediate release tablet  Commonly known as:  Oxy IR/ROXICODONE  Take 1-2 tablets (5-10 mg total) by mouth every 4 (four) hours as needed for severe pain or breakthrough pain.     rivaroxaban 20 MG Tabs  tablet  Commonly known as:  XARELTO  Take 1 tablet (20 mg total) by mouth daily.     traMADol 50 MG tablet  Commonly known as:  ULTRAM  Take 1-2 tablets (50-100 mg total) by mouth every 4 (four) hours as needed for moderate pain.        Diagnostic Studies: Dg Knee Left Port  07/31/2015  CLINICAL DATA:  Postop from total knee replacement. EXAM: PORTABLE LEFT KNEE - 1-2 VIEW COMPARISON:  None. FINDINGS: Total knee arthroplasty is seen with all 3 components in expected position. No evidence of fracture, dislocation, or other significant osseous abnormality. IMPRESSION: Expected postoperative appearance of total knee arthroplasty. No evidence of fracture or dislocation. Electronically Signed   By: Earle Gell M.D.   On: 07/31/2015 16:35    Disposition: 01-Home or Self Care      Discharge Instructions    Diet - low sodium heart healthy    Complete by:  As directed  Increase activity slowly    Complete by:  As directed            Follow-up Information    Follow up with Feliberto Gottron, PA-C On 08/16/2015.   Specialties:  Orthopedic Surgery, Emergency Medicine   Why:  At 9:15 AM   Contact information:   [place] [id number] [phone]       Follow up with Dereck Leep, MD On 09/12/2015.   Specialty:  Orthopedic Surgery   Why:  At 10:45 AM       Signed: Watt Climes. 08/01/2015, 7:36 AM

## 2015-08-01 NOTE — Progress Notes (Addendum)
   Subjective: 1 Day Post-Op Procedure(s) (LRB): COMPUTER ASSISTED TOTAL KNEE ARTHROPLASTY (Left) Patient reports pain as 2 on 0-10 scale.   Patient is well, and has had no acute complaints or problems We will start therapy today.  Plan is to go Home after hospital stay. no nausea and no vomiting Patient denies any chest pains or shortness of breath. Objective: Vital signs in last 24 hours: Temp:  [97.3 F (36.3 C)-98.8 F (37.1 C)] 98.6 F (37 C) (12/06 0529) Pulse Rate:  [62-98] 98 (12/06 0529) Resp:  [16-18] 18 (12/06 0529) BP: (93-135)/(63-82) 126/81 mmHg (12/06 0529) SpO2:  [93 %-100 %] 97 % (12/06 0529) Weight:  [129.819 kg (286 lb 3.2 oz)-131.543 kg (290 lb)] 129.819 kg (286 lb 3.2 oz) (12/05 1731) Patient has original dressing in place and at this time unable to evaluate the incision Heels are non tender and elevated off the bed using rolled towels Lungs are clear  Intake/Output from previous day: 12/05 0701 - 12/06 0700 In: 2770 [I.V.:1670; Blood:800; IV Piggyback:300] Out: 2430 [Urine:1700; Drains:330; Blood:150] Intake/Output this shift:     Recent Labs  08/01/15 0526  HGB 13.0    Recent Labs  08/01/15 0526  WBC 12.6*  RBC 4.22*  HCT 39.0*  PLT 220    Recent Labs  08/01/15 0526  NA 134*  K 3.8  CL 105  CO2 23  BUN 15  CREATININE 0.98  GLUCOSE 159*  CALCIUM 8.4*   No results for input(s): LABPT, INR in the last 72 hours.  EXAM General - Patient is Alert, Appropriate and Oriented Extremity - Neurologically intact Neurovascular intact Sensation intact distally Intact pulses distally Dorsiflexion/Plantar flexion intact Dressing - dressing C/D/I Motor Function - intact, moving foot and toes well on exam. Patient able to do straight leg raise on his own. Very good muscle tone  Past Medical History  Diagnosis Date  . Personal history of thrombophlebitis   . Hyperlipidemia   . Hypertension   . A-fib ([place])   . Arthritis   . Pulmonary  embolism ([place]) 11/26/2007    Bilateral  . DVT (deep venous thrombosis) ([facility])     Assessment/Plan: 1 Day Post-Op Procedure(s) (LRB): COMPUTER ASSISTED TOTAL KNEE ARTHROPLASTY (Left) Active Problems:   Total knee replacement status  Estimated body mass index is 38.81 kg/(m^2) as calculated from the following:   Height as of this encounter: 6' (1.829 m).   Weight as of this encounter: 129.819 kg (286 lb 3.2 oz). Advance diet Up with therapy D/C IV fluids Plan for discharge tomorrow in the p.m.  Labs: Were reviewed  DVT Prophylaxis - Xarelto, Foot Pumps and TED hose Weight-Bearing as tolerated to left leg D/C O2 and Pulse OX and try on Room Air Begin working on a bowel movement today Patient needs to try it around the nurse's desk today if possible Labs in a.m.  Jillyn Ledger. [place] 08/01/2015, 7:19 AM

## 2015-08-02 LAB — CBC
HCT: 32.9 % — ABNORMAL LOW (ref 40.0–52.0)
HEMOGLOBIN: 11.4 g/dL — AB (ref 13.0–18.0)
MCH: 32.2 pg (ref 26.0–34.0)
MCHC: 34.8 g/dL (ref 32.0–36.0)
MCV: 92.8 fL (ref 80.0–100.0)
PLATELETS: 186 10*3/uL (ref 150–440)
RBC: 3.54 MIL/uL — AB (ref 4.40–5.90)
RDW: 14 % (ref 11.5–14.5)
WBC: 10.6 10*3/uL (ref 3.8–10.6)

## 2015-08-02 MED ORDER — LACTULOSE 10 GM/15ML PO SOLN
10.0000 g | Freq: Two times a day (BID) | ORAL | Status: DC | PRN
  Administered 2015-08-02: 10 g via ORAL
  Filled 2015-08-02: qty 30

## 2015-08-02 NOTE — Care Management (Signed)
Patient discharging home today to: [facility] [place] [address]. I have notified Jerry/Tim with [facility] of patient address and discharge today. Rolling walker delivered yesterday by [facility]. No further RNCM needs. Case closed.

## 2015-08-02 NOTE — Care Management Important Message (Signed)
Important Message  Patient Details  Name: Hector Bennett MRN: [id number] Date of Birth: [birth year]   Medicare Important Message Given:       Juliann Pulse A Christon Parada 08/02/2015, 10:09 AM

## 2015-08-02 NOTE — Progress Notes (Signed)
Physical Therapy Treatment Patient Details Name: Hector Bennett MRN: [id number] DOB: 1946-02-26 Today's Date: 08/02/2015    History of Present Illness Pt is a 69 y.o. male s/p L TKA 07/31/15.  PMH includes htn, B PE, DVT, a-fib, and s/p IVC filter insertion 07/25/15.    PT Comments    Pt progressing with functional mobility and able to navigate stairs with CGA and 2nd assist to stabilize RW at top/bottom of stairs (pt has 2 steps to enter without railing).  Pt SBA with transfers and ambulation using RW.  Pt verbalizing no concerns for discharge home.   Follow Up Recommendations  Home health PT (assist for stairs)     Equipment Recommendations  Rolling walker with 5" wheels (pt fitted for RW for home use)    Recommendations for Other Services       Precautions / Restrictions Precautions Precautions: Fall;Knee Restrictions Weight Bearing Restrictions: Yes LLE Weight Bearing: Weight bearing as tolerated    Mobility  Bed Mobility Overal bed mobility: Needs Assistance Bed Mobility: Supine to Sit     Supine to sit: Supervision     General bed mobility comments: increased time to perform on his own; HOB flat  Transfers Overall transfer level: Needs assistance Equipment used: Rolling walker (2 wheeled) Transfers: Sit to/from Stand Sit to Stand: Supervision         General transfer comment: increased effort to stand on his own  Ambulation/Gait Ambulation/Gait assistance: Supervision Ambulation Distance (Feet): 160 Feet Assistive device: Rolling walker (2 wheeled)   Gait velocity: decreased   General Gait Details: mildly antalgic; decreased stance time L LE; steady   Stairs Stairs: Yes Stairs assistance: Min guard   Number of Stairs: 4 General stair comments: up/down 2 stairs with B railing CGA; up/down 2 stairs with RW CGA with 2nd assist to stabilize RW (at top and bottom of step); pt required initial vc's for correct stepping pattern/stairs technique and  then pt able to perform without cueing  Wheelchair Mobility    Modified Rankin (Stroke Patients Only)       Balance   Sitting-balance support: No upper extremity supported;Feet supported Sitting balance-Leahy Scale: Normal     Standing balance support: Bilateral upper extremity supported (on RW) Standing balance-Leahy Scale: Good                      Cognition Arousal/Alertness: Awake/alert Behavior During Therapy: WFL for tasks assessed/performed Overall Cognitive Status: Within Functional Limits for tasks assessed                      Exercises Total Joint Exercises Goniometric ROM: L knee extension 10 degrees short of neutral semi-supine; L knee flexion 96 degrees in sitting  Performed semi-supine B LE therapeutic exercise x 10 reps:  Ankle pumps (AROM B LE's); quad sets x3 second holds (AROM B LE's); SAQ's (AROM R; AROM L); heelslides (AROM R; AROM L), hip abd/adduction (AROM R; AROM L), and SLR (AROM R; AROM L).  No vc's or tactile cues required for correct technique with exercises.     General Comments General comments (skin integrity, edema, etc.): Bloody drainage noted at incision and removed drain site  Nursing cleared pt for participation in physical therapy.  Pt agreeable to PT session.      Pertinent Vitals/Pain Pain Assessment: 0-10 Pain Score: 3  Pain Location: L knee Pain Descriptors / Indicators: Sore;Tender Pain Intervention(s): Limited activity within patient's tolerance;Monitored during session;Premedicated before session;Repositioned;Ice applied  Vitals stable and WFL throughout treatment session.    Home Living                      Prior Function            PT Goals (current goals can now be found in the care plan section) Acute Rehab PT Goals Patient Stated Goal: to go home PT Goal Formulation: With patient Time For Goal Achievement: 08/15/15 Potential to Achieve Goals: Good Progress towards PT goals: Progressing  toward goals    Frequency  BID    PT Plan Current plan remains appropriate    Co-evaluation             End of Session Equipment Utilized During Treatment: Gait belt Activity Tolerance: Patient tolerated treatment well Patient left: in chair;with call bell/phone within reach;with chair alarm set;with SCD's reapplied (B heels elevated via towel rolls; polar care in place)     Time: [id number] PT Time Calculation (min) (ACUTE ONLY): 38 min  Charges:  $Gait Training: 8-22 mins $Therapeutic Exercise: 8-22 mins $Therapeutic Activity: 8-22 mins                    G CodesLeitha Bleak Aug 20, 2015, 1:03 PM Leitha Bleak, [place]

## 2015-08-02 NOTE — Care Management Important Message (Signed)
Important Message  Patient Details  Name: Hector Bennett MRN: [id number] Date of Birth: September 24, 1945   Medicare Important Message Given:  Yes    Juliann Pulse A Brysin Towery 08/02/2015, 10:09 AM

## 2015-08-02 NOTE — Progress Notes (Signed)
   Subjective: 2 Days Post-Op Procedure(s) (LRB): COMPUTER ASSISTED TOTAL KNEE ARTHROPLASTY (Left) Patient reports pain as mild.   Patient is well, and has had no acute complaints or problems Continue with physical  therapy today.  Plan is to go Home after hospital stay. no nausea and no vomiting Patient denies any chest pains or shortness of breath. Objective: Vital signs in last 24 hours: Temp:  [97.5 F (36.4 C)-98.7 F (37.1 C)] 98.3 F (36.8 C) (12/07 0748) Pulse Rate:  [59-77] 75 (12/07 0748) Resp:  [18-19] 18 (12/07 0748) BP: (73-121)/(36-98) 106/56 mmHg (12/07 0748) SpO2:  [96 %-100 %] 96 % (12/07 0748) well approximated incision Heels are non tender and elevated off the bed using rolled towels Intake/Output from previous day: 12/06 0701 - 12/07 0700 In: 3655 [P.O.:720; I.V.:2935] Out: 810 [Urine:450; Drains:360] Intake/Output this shift:     Recent Labs  08/01/15 0526 08/02/15 0537  HGB 13.0 11.4*    Recent Labs  08/01/15 0526 08/02/15 0537  WBC 12.6* 10.6  RBC 4.22* 3.54*  HCT 39.0* 32.9*  PLT 220 186    Recent Labs  08/01/15 0526  NA 134*  K 3.8  CL 105  CO2 23  BUN 15  CREATININE 0.98  GLUCOSE 159*  CALCIUM 8.4*   No results for input(s): LABPT, INR in the last 72 hours.  EXAM General - Patient is Alert, Appropriate and Oriented Extremity - Neurologically intact Neurovascular intact Sensation intact distally Intact pulses distally Dorsiflexion/Plantar flexion intact Dressing - moderate drainage Motor Function - intact, moving foot and toes well on exam.    Past Medical History  Diagnosis Date  . Personal history of thrombophlebitis   . Hyperlipidemia   . Hypertension   . A-fib ([place])   . Arthritis   . Pulmonary embolism ([place]) 11/26/2007    Bilateral  . DVT (deep venous thrombosis) ([facility])     Assessment/Plan: 2 Days Post-Op Procedure(s) (LRB): COMPUTER ASSISTED TOTAL KNEE ARTHROPLASTY (Left) Active Problems:   Total knee  replacement status  Estimated body mass index is 38.81 kg/(m^2) as calculated from the following:   Height as of this encounter: 6' (1.829 m).   Weight as of this encounter: 129.819 kg (286 lb 3.2 oz). Up with therapy Discharge home with home health after pt has a bowel movement  Labs: reviewed DVT Prophylaxis - Xarelto Weight-Bearing as tolerated to left leg hemovac discontinued Change dressing prior to d/c  Sydnei Ohaver R. Wright [place] 08/02/2015, 10:10 AM

## 2015-08-02 NOTE — Progress Notes (Signed)
Physical Therapy Treatment Patient Details Name: Hector Bennett MRN: [id number] DOB: April 11, 1946 Today's Date: 08/02/2015    History of Present Illness Pt is a 69 y.o. male s/p L TKA 07/31/15.  PMH includes htn, B PE, DVT, a-fib, and s/p IVC filter insertion 07/25/15.    PT Comments    Pt able to ambulate around nursing loop with RW and SBA.  Pt declined stairs this afternoon (pt reporting feeling comfortable with stairs after this morning's session).  Reviewed technique and assist needs for stairs (see AM PT note) and car transfer technique; pt verbalized good understanding.  Pt reports no questions or concerns for discharge home.  Plan for pt to discharge home today.   Follow Up Recommendations  Home health PT (assist for stairs)     Equipment Recommendations  Rolling walker with 5" wheels    Recommendations for Other Services       Precautions / Restrictions Precautions Precautions: Fall;Knee Restrictions Weight Bearing Restrictions: Yes LLE Weight Bearing: Weight bearing as tolerated    Mobility  Bed Mobility         General bed mobility comments: deferred d/t pt sitting up in chair  Transfers Overall transfer level: Needs assistance Equipment used: Rolling walker (2 wheeled) Transfers: Sit to/from Stand Sit to Stand: Supervision (x4 repetitions (with armrests and without armrests))         General transfer comment: increased effort to stand on his own but steady without loss of balance  Ambulation/Gait Ambulation/Gait assistance: Supervision Ambulation Distance (Feet): 240 Feet Assistive device: Rolling walker (2 wheeled)   Gait velocity: decreased (mildly)   General Gait Details: mildly antalgic; decreased stance time L LE; steady   Stairs      Wheelchair Mobility    Modified Rankin (Stroke Patients Only)       Balance   Sitting-balance support: No upper extremity supported;Feet supported Sitting balance-Leahy Scale: Normal      Standing balance support: Bilateral upper extremity supported (on RW) Standing balance-Leahy Scale: Good                      Cognition Arousal/Alertness: Awake/alert Behavior During Therapy: WFL for tasks assessed/performed Overall Cognitive Status: Within Functional Limits for tasks assessed                      Exercises x20 L LE LAQ's AROM    General Comments General comments (skin integrity, edema, etc.): Bloody drainage noted at incision and removed drain site  Pt agreeable to PT session.      Pertinent Vitals/Pain Pain Assessment: 0-10 Pain Score: 3  Pain Location: L knee Pain Descriptors / Indicators: Sore;Tender Pain Intervention(s): Limited activity within patient's tolerance;Monitored during session;Repositioned;Ice applied  Vitals stable and WFL throughout treatment session.    Home Living                      Prior Function            PT Goals (current goals can now be found in the care plan section) Acute Rehab PT Goals Patient Stated Goal: to go home PT Goal Formulation: With patient Time For Goal Achievement: 08/15/15 Potential to Achieve Goals: Good Progress towards PT goals: Progressing toward goals    Frequency  BID    PT Plan Current plan remains appropriate    Co-evaluation             End of Session Equipment Utilized During Treatment:  Gait belt Activity Tolerance: Patient tolerated treatment well Patient left: in chair;with call bell/phone within reach;with chair alarm set;with SCD's reapplied (B heels elevated via towel rolls; polar care in place)     Time: [id number] PT Time Calculation (min) (ACUTE ONLY): 23 min  Charges:  $Gait Training: 8-22 mins $Therapeutic Activity: 8-22 mins                    G CodesLeitha Bleak 08-20-15, 4:32 PM Leitha Bleak, [place]

## 2015-08-03 DIAGNOSIS — Z79891 Long term (current) use of opiate analgesic: Secondary | ICD-10-CM | POA: Diagnosis not present

## 2015-08-03 DIAGNOSIS — I10 Essential (primary) hypertension: Secondary | ICD-10-CM | POA: Diagnosis not present

## 2015-08-03 DIAGNOSIS — Z96652 Presence of left artificial knee joint: Secondary | ICD-10-CM | POA: Diagnosis not present

## 2015-08-03 DIAGNOSIS — I4891 Unspecified atrial fibrillation: Secondary | ICD-10-CM | POA: Diagnosis not present

## 2015-08-03 DIAGNOSIS — Z7901 Long term (current) use of anticoagulants: Secondary | ICD-10-CM | POA: Diagnosis not present

## 2015-08-03 DIAGNOSIS — Z86718 Personal history of other venous thrombosis and embolism: Secondary | ICD-10-CM | POA: Diagnosis not present

## 2015-08-03 DIAGNOSIS — Z471 Aftercare following joint replacement surgery: Secondary | ICD-10-CM | POA: Diagnosis not present

## 2015-08-03 DIAGNOSIS — M1990 Unspecified osteoarthritis, unspecified site: Secondary | ICD-10-CM | POA: Diagnosis not present

## 2015-08-03 DIAGNOSIS — E785 Hyperlipidemia, unspecified: Secondary | ICD-10-CM | POA: Diagnosis not present

## 2015-08-03 DIAGNOSIS — Z86711 Personal history of pulmonary embolism: Secondary | ICD-10-CM | POA: Diagnosis not present

## 2015-08-04 DIAGNOSIS — M1990 Unspecified osteoarthritis, unspecified site: Secondary | ICD-10-CM | POA: Diagnosis not present

## 2015-08-04 DIAGNOSIS — Z7901 Long term (current) use of anticoagulants: Secondary | ICD-10-CM | POA: Diagnosis not present

## 2015-08-04 DIAGNOSIS — Z86711 Personal history of pulmonary embolism: Secondary | ICD-10-CM | POA: Diagnosis not present

## 2015-08-04 DIAGNOSIS — Z471 Aftercare following joint replacement surgery: Secondary | ICD-10-CM | POA: Diagnosis not present

## 2015-08-04 DIAGNOSIS — I10 Essential (primary) hypertension: Secondary | ICD-10-CM | POA: Diagnosis not present

## 2015-08-04 DIAGNOSIS — I4891 Unspecified atrial fibrillation: Secondary | ICD-10-CM | POA: Diagnosis not present

## 2015-08-04 DIAGNOSIS — Z96652 Presence of left artificial knee joint: Secondary | ICD-10-CM | POA: Diagnosis not present

## 2015-08-04 DIAGNOSIS — Z79891 Long term (current) use of opiate analgesic: Secondary | ICD-10-CM | POA: Diagnosis not present

## 2015-08-04 DIAGNOSIS — E785 Hyperlipidemia, unspecified: Secondary | ICD-10-CM | POA: Diagnosis not present

## 2015-08-04 DIAGNOSIS — Z86718 Personal history of other venous thrombosis and embolism: Secondary | ICD-10-CM | POA: Diagnosis not present

## 2015-08-07 DIAGNOSIS — M1990 Unspecified osteoarthritis, unspecified site: Secondary | ICD-10-CM | POA: Diagnosis not present

## 2015-08-07 DIAGNOSIS — Z96652 Presence of left artificial knee joint: Secondary | ICD-10-CM | POA: Diagnosis not present

## 2015-08-07 DIAGNOSIS — Z471 Aftercare following joint replacement surgery: Secondary | ICD-10-CM | POA: Diagnosis not present

## 2015-08-07 DIAGNOSIS — I10 Essential (primary) hypertension: Secondary | ICD-10-CM | POA: Diagnosis not present

## 2015-08-07 DIAGNOSIS — Z86718 Personal history of other venous thrombosis and embolism: Secondary | ICD-10-CM | POA: Diagnosis not present

## 2015-08-07 DIAGNOSIS — I4891 Unspecified atrial fibrillation: Secondary | ICD-10-CM | POA: Diagnosis not present

## 2015-08-07 DIAGNOSIS — Z7901 Long term (current) use of anticoagulants: Secondary | ICD-10-CM | POA: Diagnosis not present

## 2015-08-07 DIAGNOSIS — E785 Hyperlipidemia, unspecified: Secondary | ICD-10-CM | POA: Diagnosis not present

## 2015-08-07 DIAGNOSIS — Z86711 Personal history of pulmonary embolism: Secondary | ICD-10-CM | POA: Diagnosis not present

## 2015-08-07 DIAGNOSIS — Z79891 Long term (current) use of opiate analgesic: Secondary | ICD-10-CM | POA: Diagnosis not present

## 2015-08-08 DIAGNOSIS — Z96652 Presence of left artificial knee joint: Secondary | ICD-10-CM | POA: Diagnosis not present

## 2015-08-08 DIAGNOSIS — Z471 Aftercare following joint replacement surgery: Secondary | ICD-10-CM | POA: Diagnosis not present

## 2015-08-08 DIAGNOSIS — M1990 Unspecified osteoarthritis, unspecified site: Secondary | ICD-10-CM | POA: Diagnosis not present

## 2015-08-08 DIAGNOSIS — I10 Essential (primary) hypertension: Secondary | ICD-10-CM | POA: Diagnosis not present

## 2015-08-08 DIAGNOSIS — Z86711 Personal history of pulmonary embolism: Secondary | ICD-10-CM | POA: Diagnosis not present

## 2015-08-08 DIAGNOSIS — Z86718 Personal history of other venous thrombosis and embolism: Secondary | ICD-10-CM | POA: Diagnosis not present

## 2015-08-08 DIAGNOSIS — Z79891 Long term (current) use of opiate analgesic: Secondary | ICD-10-CM | POA: Diagnosis not present

## 2015-08-08 DIAGNOSIS — I4891 Unspecified atrial fibrillation: Secondary | ICD-10-CM | POA: Diagnosis not present

## 2015-08-08 DIAGNOSIS — Z7901 Long term (current) use of anticoagulants: Secondary | ICD-10-CM | POA: Diagnosis not present

## 2015-08-08 DIAGNOSIS — E785 Hyperlipidemia, unspecified: Secondary | ICD-10-CM | POA: Diagnosis not present

## 2015-08-09 DIAGNOSIS — M1990 Unspecified osteoarthritis, unspecified site: Secondary | ICD-10-CM | POA: Diagnosis not present

## 2015-08-09 DIAGNOSIS — I10 Essential (primary) hypertension: Secondary | ICD-10-CM | POA: Diagnosis not present

## 2015-08-09 DIAGNOSIS — E785 Hyperlipidemia, unspecified: Secondary | ICD-10-CM | POA: Diagnosis not present

## 2015-08-09 DIAGNOSIS — Z471 Aftercare following joint replacement surgery: Secondary | ICD-10-CM | POA: Diagnosis not present

## 2015-08-09 DIAGNOSIS — Z79891 Long term (current) use of opiate analgesic: Secondary | ICD-10-CM | POA: Diagnosis not present

## 2015-08-09 DIAGNOSIS — Z7901 Long term (current) use of anticoagulants: Secondary | ICD-10-CM | POA: Diagnosis not present

## 2015-08-09 DIAGNOSIS — Z86718 Personal history of other venous thrombosis and embolism: Secondary | ICD-10-CM | POA: Diagnosis not present

## 2015-08-09 DIAGNOSIS — Z86711 Personal history of pulmonary embolism: Secondary | ICD-10-CM | POA: Diagnosis not present

## 2015-08-09 DIAGNOSIS — I4891 Unspecified atrial fibrillation: Secondary | ICD-10-CM | POA: Diagnosis not present

## 2015-08-09 DIAGNOSIS — Z96652 Presence of left artificial knee joint: Secondary | ICD-10-CM | POA: Diagnosis not present

## 2015-08-10 DIAGNOSIS — Z86718 Personal history of other venous thrombosis and embolism: Secondary | ICD-10-CM | POA: Diagnosis not present

## 2015-08-10 DIAGNOSIS — M1990 Unspecified osteoarthritis, unspecified site: Secondary | ICD-10-CM | POA: Diagnosis not present

## 2015-08-10 DIAGNOSIS — Z79891 Long term (current) use of opiate analgesic: Secondary | ICD-10-CM | POA: Diagnosis not present

## 2015-08-10 DIAGNOSIS — Z7901 Long term (current) use of anticoagulants: Secondary | ICD-10-CM | POA: Diagnosis not present

## 2015-08-10 DIAGNOSIS — Z96652 Presence of left artificial knee joint: Secondary | ICD-10-CM | POA: Diagnosis not present

## 2015-08-10 DIAGNOSIS — E785 Hyperlipidemia, unspecified: Secondary | ICD-10-CM | POA: Diagnosis not present

## 2015-08-10 DIAGNOSIS — I4891 Unspecified atrial fibrillation: Secondary | ICD-10-CM | POA: Diagnosis not present

## 2015-08-10 DIAGNOSIS — Z471 Aftercare following joint replacement surgery: Secondary | ICD-10-CM | POA: Diagnosis not present

## 2015-08-10 DIAGNOSIS — Z86711 Personal history of pulmonary embolism: Secondary | ICD-10-CM | POA: Diagnosis not present

## 2015-08-10 DIAGNOSIS — I10 Essential (primary) hypertension: Secondary | ICD-10-CM | POA: Diagnosis not present

## 2015-08-11 DIAGNOSIS — M1990 Unspecified osteoarthritis, unspecified site: Secondary | ICD-10-CM | POA: Diagnosis not present

## 2015-08-11 DIAGNOSIS — Z471 Aftercare following joint replacement surgery: Secondary | ICD-10-CM | POA: Diagnosis not present

## 2015-08-11 DIAGNOSIS — Z79891 Long term (current) use of opiate analgesic: Secondary | ICD-10-CM | POA: Diagnosis not present

## 2015-08-11 DIAGNOSIS — E785 Hyperlipidemia, unspecified: Secondary | ICD-10-CM | POA: Diagnosis not present

## 2015-08-11 DIAGNOSIS — I10 Essential (primary) hypertension: Secondary | ICD-10-CM | POA: Diagnosis not present

## 2015-08-11 DIAGNOSIS — Z86711 Personal history of pulmonary embolism: Secondary | ICD-10-CM | POA: Diagnosis not present

## 2015-08-11 DIAGNOSIS — Z86718 Personal history of other venous thrombosis and embolism: Secondary | ICD-10-CM | POA: Diagnosis not present

## 2015-08-11 DIAGNOSIS — I4891 Unspecified atrial fibrillation: Secondary | ICD-10-CM | POA: Diagnosis not present

## 2015-08-11 DIAGNOSIS — Z96652 Presence of left artificial knee joint: Secondary | ICD-10-CM | POA: Diagnosis not present

## 2015-08-11 DIAGNOSIS — Z7901 Long term (current) use of anticoagulants: Secondary | ICD-10-CM | POA: Diagnosis not present

## 2015-08-14 DIAGNOSIS — E785 Hyperlipidemia, unspecified: Secondary | ICD-10-CM | POA: Diagnosis not present

## 2015-08-14 DIAGNOSIS — Z96652 Presence of left artificial knee joint: Secondary | ICD-10-CM | POA: Diagnosis not present

## 2015-08-14 DIAGNOSIS — Z471 Aftercare following joint replacement surgery: Secondary | ICD-10-CM | POA: Diagnosis not present

## 2015-08-14 DIAGNOSIS — I87091 Postthrombotic syndrome with other complications of right lower extremity: Secondary | ICD-10-CM | POA: Diagnosis not present

## 2015-08-14 DIAGNOSIS — Z79891 Long term (current) use of opiate analgesic: Secondary | ICD-10-CM | POA: Diagnosis not present

## 2015-08-14 DIAGNOSIS — Z86718 Personal history of other venous thrombosis and embolism: Secondary | ICD-10-CM | POA: Diagnosis not present

## 2015-08-14 DIAGNOSIS — M79609 Pain in unspecified limb: Secondary | ICD-10-CM | POA: Diagnosis not present

## 2015-08-14 DIAGNOSIS — Z7901 Long term (current) use of anticoagulants: Secondary | ICD-10-CM | POA: Diagnosis not present

## 2015-08-14 DIAGNOSIS — I10 Essential (primary) hypertension: Secondary | ICD-10-CM | POA: Diagnosis not present

## 2015-08-14 DIAGNOSIS — Z86711 Personal history of pulmonary embolism: Secondary | ICD-10-CM | POA: Diagnosis not present

## 2015-08-14 DIAGNOSIS — I4891 Unspecified atrial fibrillation: Secondary | ICD-10-CM | POA: Diagnosis not present

## 2015-08-14 DIAGNOSIS — M1990 Unspecified osteoarthritis, unspecified site: Secondary | ICD-10-CM | POA: Diagnosis not present

## 2015-08-15 ENCOUNTER — Other Ambulatory Visit: Payer: Self-pay | Admitting: Vascular Surgery

## 2015-08-15 DIAGNOSIS — Z96652 Presence of left artificial knee joint: Secondary | ICD-10-CM | POA: Diagnosis not present

## 2015-08-15 DIAGNOSIS — Z86718 Personal history of other venous thrombosis and embolism: Secondary | ICD-10-CM | POA: Diagnosis not present

## 2015-08-15 DIAGNOSIS — I10 Essential (primary) hypertension: Secondary | ICD-10-CM | POA: Diagnosis not present

## 2015-08-15 DIAGNOSIS — I4891 Unspecified atrial fibrillation: Secondary | ICD-10-CM | POA: Diagnosis not present

## 2015-08-15 DIAGNOSIS — E785 Hyperlipidemia, unspecified: Secondary | ICD-10-CM | POA: Diagnosis not present

## 2015-08-15 DIAGNOSIS — Z86711 Personal history of pulmonary embolism: Secondary | ICD-10-CM | POA: Diagnosis not present

## 2015-08-15 DIAGNOSIS — Z471 Aftercare following joint replacement surgery: Secondary | ICD-10-CM | POA: Diagnosis not present

## 2015-08-15 DIAGNOSIS — Z7901 Long term (current) use of anticoagulants: Secondary | ICD-10-CM | POA: Diagnosis not present

## 2015-08-15 DIAGNOSIS — M1990 Unspecified osteoarthritis, unspecified site: Secondary | ICD-10-CM | POA: Diagnosis not present

## 2015-08-15 DIAGNOSIS — Z79891 Long term (current) use of opiate analgesic: Secondary | ICD-10-CM | POA: Diagnosis not present

## 2015-08-16 DIAGNOSIS — Z96652 Presence of left artificial knee joint: Secondary | ICD-10-CM | POA: Diagnosis not present

## 2015-08-18 DIAGNOSIS — Z96652 Presence of left artificial knee joint: Secondary | ICD-10-CM | POA: Diagnosis not present

## 2015-08-22 DIAGNOSIS — Z96652 Presence of left artificial knee joint: Secondary | ICD-10-CM | POA: Diagnosis not present

## 2015-08-24 DIAGNOSIS — Z96652 Presence of left artificial knee joint: Secondary | ICD-10-CM | POA: Diagnosis not present

## 2015-08-29 ENCOUNTER — Encounter
Admission: RE | Disposition: A | Discharge status: Home / Self Care | Payer: Self-pay | Source: Ambulatory Visit | Attending: Vascular Surgery

## 2015-08-29 ENCOUNTER — Encounter: Payer: Self-pay | Admitting: *Deleted

## 2015-08-29 ENCOUNTER — Ambulatory Visit
Admission: RE | Admit: 2015-08-29 | Discharge: 2015-08-29 | Disposition: A | Discharge status: Home / Self Care | Payer: Medicare Other | Source: Ambulatory Visit | Attending: Vascular Surgery | Admitting: Vascular Surgery

## 2015-08-29 DIAGNOSIS — M179 Osteoarthritis of knee, unspecified: Secondary | ICD-10-CM | POA: Diagnosis not present

## 2015-08-29 DIAGNOSIS — Z79899 Other long term (current) drug therapy: Secondary | ICD-10-CM | POA: Diagnosis not present

## 2015-08-29 DIAGNOSIS — Z87891 Personal history of nicotine dependence: Secondary | ICD-10-CM | POA: Insufficient documentation

## 2015-08-29 DIAGNOSIS — I87009 Postthrombotic syndrome without complications of unspecified extremity: Secondary | ICD-10-CM | POA: Diagnosis not present

## 2015-08-29 DIAGNOSIS — Z86711 Personal history of pulmonary embolism: Secondary | ICD-10-CM | POA: Diagnosis not present

## 2015-08-29 DIAGNOSIS — E785 Hyperlipidemia, unspecified: Secondary | ICD-10-CM | POA: Diagnosis not present

## 2015-08-29 DIAGNOSIS — I10 Essential (primary) hypertension: Secondary | ICD-10-CM | POA: Diagnosis not present

## 2015-08-29 DIAGNOSIS — Z452 Encounter for adjustment and management of vascular access device: Secondary | ICD-10-CM | POA: Insufficient documentation

## 2015-08-29 DIAGNOSIS — I80209 Phlebitis and thrombophlebitis of unspecified deep vessels of unspecified lower extremity: Secondary | ICD-10-CM | POA: Diagnosis not present

## 2015-08-29 DIAGNOSIS — I87091 Postthrombotic syndrome with other complications of right lower extremity: Secondary | ICD-10-CM | POA: Diagnosis not present

## 2015-08-29 HISTORY — PX: PERIPHERAL VASCULAR CATHETERIZATION: SHX172C

## 2015-08-29 SURGERY — IVC FILTER REMOVAL
Anesthesia: Moderate Sedation

## 2015-08-29 MED ORDER — LIDOCAINE-EPINEPHRINE (PF) 1 %-1:200000 IJ SOLN
INTRAMUSCULAR | Status: AC
  Filled 2015-08-29: qty 30

## 2015-08-29 MED ORDER — MIDAZOLAM HCL 5 MG/5ML IJ SOLN
INTRAMUSCULAR | Status: AC
  Filled 2015-08-29: qty 5

## 2015-08-29 MED ORDER — HYDROMORPHONE HCL 1 MG/ML IJ SOLN: 1.0000 mg | Freq: Once | INTRAMUSCULAR | Status: DC

## 2015-08-29 MED ORDER — HEPARIN (PORCINE) IN NACL 2-0.9 UNIT/ML-% IJ SOLN
INTRAMUSCULAR | Status: AC
  Filled 2015-08-29: qty 500

## 2015-08-29 MED ORDER — DEXTROSE 5 % IV SOLN
1.5000 g | INTRAVENOUS | Status: AC
  Administered 2015-08-29: 1.5 g via INTRAVENOUS

## 2015-08-29 MED ORDER — MIDAZOLAM HCL 2 MG/2ML IJ SOLN
INTRAMUSCULAR | Status: DC | PRN
  Administered 2015-08-29: 2 mg via INTRAVENOUS

## 2015-08-29 MED ORDER — ONDANSETRON HCL 4 MG/2ML IJ SOLN: 4.0000 mg | Freq: Four times a day (QID) | INTRAMUSCULAR | Status: DC | PRN

## 2015-08-29 MED ORDER — FENTANYL CITRATE (PF) 100 MCG/2ML IJ SOLN
INTRAMUSCULAR | Status: AC
  Filled 2015-08-29: qty 2

## 2015-08-29 MED ORDER — LIDOCAINE HCL 1 % IJ SOLN
INTRAMUSCULAR | Status: DC | PRN
  Administered 2015-08-29: 5 mL via INTRADERMAL

## 2015-08-29 MED ORDER — LIDOCAINE HCL (PF) 1 % IJ SOLN
INTRAMUSCULAR | Status: AC
  Filled 2015-08-29: qty 30

## 2015-08-29 MED ORDER — SODIUM CHLORIDE 0.9 % IV SOLN
INTRAVENOUS | Status: DC
  Administered 2015-08-29 (×2): via INTRAVENOUS

## 2015-08-29 MED ORDER — IOHEXOL 300 MG/ML  SOLN
INTRAMUSCULAR | Status: DC | PRN
  Administered 2015-08-29: 15 mL via INTRAVENOUS

## 2015-08-29 MED ORDER — FENTANYL CITRATE (PF) 100 MCG/2ML IJ SOLN
INTRAMUSCULAR | Status: DC | PRN
  Administered 2015-08-29: 50 ug via INTRAVENOUS

## 2015-08-29 SURGICAL SUPPLY — 4 items
PACK ANGIOGRAPHY (CUSTOM PROCEDURE TRAY) ×2 IMPLANT
SET VENACAVA FILTER RETRIEVAL (MISCELLANEOUS) ×2 IMPLANT
TOWEL OR 17X26 4PK STRL BLUE (TOWEL DISPOSABLE) ×2 IMPLANT
WIRE J 3MM .035X145CM (WIRE) ×2 IMPLANT

## 2015-08-29 NOTE — Op Note (Signed)
  OPERATIVE NOTE   PRE-OPERATIVE DIAGNOSIS: DVT with history of PE  POST-OPERATIVE DIAGNOSIS: Same  PROCEDURE: 1. Retrieval of IVC Filter 2. Inferior Vena Cavagram  SURGEON: Katha Cabal, M.D.  ANESTHESIA:  Conscious Sedation  ESTIMATED BLOOD LOSS: Minimal cc  FINDING(S):inferior vena cava is widely patent filter is in place in good position. Filter is removed without incident  SPECIMEN(S):  IVC filter intact  INDICATIONS:   Hector Bennett is a 70 y.o. male who presents with history of IVC filter placement several months ago. He has been doing well with anticoagulation and therefore no longer needs the filter. Recommendation for removal has been made wrist and benefits were reviewed patient has agreed to undergo removal.  DESCRIPTION: After obtaining full informed written consent, the patient was brought back to the Special Procedure Suite and placed in the supine position.  The patient received IV antibiotics prior to induction.  After obtaining adequate sedation, the patient was prepped and draped in the standard fashion and appropriate time out is called.     Ultrasound was placed in a sterile sleeve.The right neck was then imaged with ultrasound.   Jugular vein was identified it is echolucent and homogeneous indicating patency. 1% lidocaine is then infiltrated under ultrasound visualization and subsequently a Seldinger needle is inserted under real-time ultrasound guidance.  J-wire is then advanced into the inferior vena cava under fluoroscopic guidance. With the tip of the sheath at the confluence of the iliac veins inferior vena caval imaging is performed.  After review of the image the sheath is repositioned to above the filter and the snares introduced. Snares opened and the hook is secured without difficulty. The filter is then collapsed within the sheath and removed without difficulty.  Sheath is removed and pressure held, the patient tolerated the procedure well and  there were no immediate complications.  Interpretation: inferior vena cava is widely patent filter is in place in good position. Filter is removed without incident.     COMPLICATIONS: None  CONDITION: [name], M.D. [facility] Office: [phone]   08/29/2015, 1:31 PM

## 2015-08-29 NOTE — Discharge Instructions (Signed)
°  Inferior Vena Cava Filter Insertion, Care After Refer to this sheet in the next few weeks. These instructions provide you with information on caring for yourself after your procedure. Your health care provider may also give you more specific instructions. Your treatment has been planned according to current medical practices, but problems sometimes occur. Call your health care provider if you have any problems or questions after your procedure. WHAT TO EXPECT AFTER THE PROCEDURE After your procedure, it is typical to have the following:  Mild pain in the area where the filter was inserted.  Mild bruising in the area where the filter was inserted. HOME CARE INSTRUCTIONS  You will be given medicine to control pain. Only take over-the-counter or prescription medicines for pain, fever, or discomfort as directed by your health care provider.  A bandage (dressing) has been placed over the insertion site. Follow your health care provider's instructions on how to care for it.  Keep the insertion site clean and dry.  Do not soak in a bath tub or pool until the filter insertion site has healed.  Do not drive if you are taking narcotic pain medicines. Follow your health care provider's instructions about driving.  Do not return to work or school until your health care provider says it is okay.   Keep all follow-up appointments.  SEEK IMMEDIATE MEDICAL CARE IF:  You develop swelling and discoloration or pain in the legs.  Your legs become pale and cold or blue.  You develop shortness of breath, feel faint, or pass out.  You develop chest pain, a cough, or difficulty breathing.  You cough up blood.  You develop a rash or feel you are having problems that may be a side effect of medicines.  You develop weakness, difficulty moving your arms or legs, or balance problems.  You develop problems with speech or vision.   This information is not intended to replace advice given to you by  your health care provider. Make sure you discuss any questions you have with your health care provider.   Document Released: 06/02/2013 Document Reviewed: 06/02/2013 Elsevier Interactive Patient Education [employer].

## 2015-08-29 NOTE — H&P (Signed)
[facility] History & Physical Update  The patient was interviewed and re-examined.  The patient's previous History and Physical has been reviewed and is unchanged.  There is no change in the plan of care. We plan to proceed with the scheduled procedure.  Caitlinn Klinker, Dolores Lory, MD  08/29/2015, 1:30 PM

## 2015-08-30 DIAGNOSIS — Z96652 Presence of left artificial knee joint: Secondary | ICD-10-CM | POA: Diagnosis not present

## 2015-09-01 DIAGNOSIS — Z96652 Presence of left artificial knee joint: Secondary | ICD-10-CM | POA: Diagnosis not present

## 2015-09-05 ENCOUNTER — Encounter: Payer: Self-pay | Admitting: Vascular Surgery

## 2015-09-07 DIAGNOSIS — Z96652 Presence of left artificial knee joint: Secondary | ICD-10-CM | POA: Diagnosis not present

## 2015-09-08 DIAGNOSIS — Z96652 Presence of left artificial knee joint: Secondary | ICD-10-CM | POA: Diagnosis not present

## 2015-09-08 DIAGNOSIS — M1990 Unspecified osteoarthritis, unspecified site: Secondary | ICD-10-CM | POA: Diagnosis not present

## 2015-09-08 DIAGNOSIS — I10 Essential (primary) hypertension: Secondary | ICD-10-CM | POA: Diagnosis not present

## 2015-09-08 DIAGNOSIS — Z471 Aftercare following joint replacement surgery: Secondary | ICD-10-CM | POA: Diagnosis not present

## 2015-09-12 DIAGNOSIS — Z96652 Presence of left artificial knee joint: Secondary | ICD-10-CM | POA: Diagnosis not present

## 2015-09-14 DIAGNOSIS — I8312 Varicose veins of left lower extremity with inflammation: Secondary | ICD-10-CM | POA: Diagnosis not present

## 2015-09-14 DIAGNOSIS — I87093 Postthrombotic syndrome with other complications of bilateral lower extremity: Secondary | ICD-10-CM | POA: Diagnosis not present

## 2015-10-26 ENCOUNTER — Ambulatory Visit (INDEPENDENT_AMBULATORY_CARE_PROVIDER_SITE_OTHER): Payer: Medicare Other | Admitting: Family Medicine

## 2015-10-26 ENCOUNTER — Encounter: Payer: Self-pay | Admitting: Family Medicine

## 2015-10-26 VITALS — BP 143/82 | HR 60 | Temp 98.9°F | Ht 69.1 in | Wt 291.0 lb

## 2015-10-26 DIAGNOSIS — I481 Persistent atrial fibrillation: Secondary | ICD-10-CM | POA: Diagnosis not present

## 2015-10-26 DIAGNOSIS — Z0000 Encounter for general adult medical examination without abnormal findings: Secondary | ICD-10-CM

## 2015-10-26 DIAGNOSIS — I10 Essential (primary) hypertension: Secondary | ICD-10-CM | POA: Diagnosis not present

## 2015-10-26 DIAGNOSIS — I4819 Other persistent atrial fibrillation: Secondary | ICD-10-CM

## 2015-10-26 DIAGNOSIS — N400 Enlarged prostate without lower urinary tract symptoms: Secondary | ICD-10-CM

## 2015-10-26 DIAGNOSIS — E785 Hyperlipidemia, unspecified: Secondary | ICD-10-CM

## 2015-10-26 DIAGNOSIS — M1712 Unilateral primary osteoarthritis, left knee: Secondary | ICD-10-CM

## 2015-10-26 LAB — URINALYSIS, ROUTINE W REFLEX MICROSCOPIC
BILIRUBIN UA: NEGATIVE
GLUCOSE, UA: NEGATIVE
Ketones, UA: NEGATIVE
Leukocytes, UA: NEGATIVE
Nitrite, UA: NEGATIVE
PROTEIN UA: NEGATIVE
RBC, UA: NEGATIVE
Specific Gravity, UA: 1.015 (ref 1.005–1.030)
Urobilinogen, Ur: 1 mg/dL (ref 0.2–1.0)
pH, UA: 6.5 (ref 5.0–7.5)

## 2015-10-26 MED ORDER — LOVASTATIN 40 MG PO TABS: 40.0000 mg | ORAL_TABLET | Freq: Every day | ORAL | Status: DC

## 2015-10-26 MED ORDER — ATENOLOL 50 MG PO TABS: 50.0000 mg | ORAL_TABLET | ORAL | Status: DC

## 2015-10-26 MED ORDER — RIVAROXABAN 20 MG PO TABS: 20.0000 mg | ORAL_TABLET | Freq: Every day | ORAL | Status: DC

## 2015-10-26 NOTE — Progress Notes (Signed)
BP 143/82 mmHg  Pulse 60  Temp(Src) 98.9 F (37.2 C)  Ht 5' 9.1" (1.755 m)  Wt 291 lb (131.997 kg)  BMI 42.86 kg/m2  SpO2 98%   Subjective:    Patient ID: Hector Bennett, male    DOB: 1946/07/13, 70 y.o.   MRN: [id number]  HPI: Hector Bennett is a 70 y.o. male  Chief Complaint  Patient presents with  . Annual Exam   Patient doing well just had knee replacement surgery in December is really doing well making fast progress gave up his cane 2 weeks ago. Has been able to cut back on one blood pressure medicine did so in the hospital and remains on atenolol 50 mg without problems Taking Xarelto long-term for cardiovascular and stroke prevention Taking lovastatin without problems 40 mg in no side effects.  Relevant past medical, surgical, family and social history reviewed and updated as indicated. Interim medical history since our last visit reviewed. Allergies and medications reviewed and updated.  Review of Systems  Constitutional: Negative.   HENT: Negative.   Eyes: Negative.   Respiratory: Negative.   Cardiovascular: Negative.   Gastrointestinal: Negative.   Endocrine: Negative.   Genitourinary: Negative.   Musculoskeletal: Negative.   Skin: Negative.   Allergic/Immunologic: Negative.   Neurological: Negative.   Hematological: Negative.   Psychiatric/Behavioral: Negative.     Per HPI unless specifically indicated above     Objective:    BP 143/82 mmHg  Pulse 60  Temp(Src) 98.9 F (37.2 C)  Ht 5' 9.1" (1.755 m)  Wt 291 lb (131.997 kg)  BMI 42.86 kg/m2  SpO2 98%  Wt Readings from Last 3 Encounters:  10/26/15 291 lb (131.997 kg)  08/29/15 286 lb (129.729 kg)  07/31/15 286 lb 3.2 oz (129.819 kg)    Physical Exam  Constitutional: He is oriented to person, place, and time. He appears well-developed and well-nourished.  HENT:  Head: Normocephalic and atraumatic.  Right Ear: External ear normal.  Left Ear: External ear normal.  Eyes: Conjunctivae and  EOM are normal. Pupils are equal, round, and reactive to light.  Neck: Normal range of motion. Neck supple.  Cardiovascular: Normal rate, normal heart sounds and intact distal pulses.   Pulmonary/Chest: Effort normal and breath sounds normal.  Abdominal: Soft. Bowel sounds are normal. There is no splenomegaly or hepatomegaly.  Genitourinary: Rectum normal and penis normal.  Mild BPH changes  Musculoskeletal: Normal range of motion.  Neurological: He is alert and oriented to person, place, and time. He has normal reflexes.  Skin: No rash noted. No erythema.  Psychiatric: He has a normal mood and affect. His behavior is normal. Judgment and thought content normal.    Results for orders placed or performed during the hospital encounter of 07/31/15  CBC  Result Value Ref Range   WBC 12.6 (H) 3.8 - 10.6 K/uL   RBC 4.22 (L) 4.40 - 5.90 MIL/uL   Hemoglobin 13.0 13.0 - 18.0 g/dL   HCT 39.0 (L) 40.0 - 52.0 %   MCV 92.5 80.0 - 100.0 fL   MCH 30.9 26.0 - 34.0 pg   MCHC 33.4 32.0 - 36.0 g/dL   RDW 13.8 11.5 - 14.5 %   Platelets 220 150 - 440 K/uL  Basic metabolic panel  Result Value Ref Range   Sodium 134 (L) 135 - 145 mmol/L   Potassium 3.8 3.5 - 5.1 mmol/L   Chloride 105 101 - 111 mmol/L   CO2 23 22 - 32 mmol/L  Glucose, Bld 159 (H) 65 - 99 mg/dL   BUN 15 6 - 20 mg/dL   Creatinine, Ser 0.98 0.61 - 1.24 mg/dL   Calcium 8.4 (L) 8.9 - 10.3 mg/dL   GFR calc non Af Amer >60 >60 mL/min   GFR calc Af Amer >60 >60 mL/min   Anion gap 6 5 - 15  CBC  Result Value Ref Range   WBC 10.6 3.8 - 10.6 K/uL   RBC 3.54 (L) 4.40 - 5.90 MIL/uL   Hemoglobin 11.4 (L) 13.0 - 18.0 g/dL   HCT 32.9 (L) 40.0 - 52.0 %   MCV 92.8 80.0 - 100.0 fL   MCH 32.2 26.0 - 34.0 pg   MCHC 34.8 32.0 - 36.0 g/dL   RDW 14.0 11.5 - 14.5 %   Platelets 186 150 - 440 K/uL      Assessment & Plan:   Problem List Items Addressed This Visit      Cardiovascular and Mediastinum   Hypertension - Primary    The current  medical regimen is effective;  continue present plan and medications.       Relevant Medications   rivaroxaban (XARELTO) 20 MG TABS tablet   lovastatin (MEVACOR) 40 MG tablet   atenolol (TENORMIN) 50 MG tablet   Other Relevant Orders   Comprehensive metabolic panel   CBC with Differential/Platelet   Urinalysis, Routine w reflex microscopic (not at [facility])   TSH   A-fib [facility])    The current medical regimen is effective;  continue present plan and medications.       Relevant Medications   rivaroxaban (XARELTO) 20 MG TABS tablet   lovastatin (MEVACOR) 40 MG tablet   atenolol (TENORMIN) 50 MG tablet   Other Relevant Orders   Comprehensive metabolic panel   CBC with Differential/Platelet   Urinalysis, Routine w reflex microscopic (not at [facility])   TSH     Musculoskeletal and Integument   Left knee DJD   Relevant Medications   acetaminophen (RA ACETAMINOPHEN) 650 MG CR tablet     Genitourinary   BPH (benign prostatic hyperplasia)   Relevant Orders   PSA     Other   Hyperlipidemia    The current medical regimen is effective;  continue present plan and medications.       Relevant Medications   rivaroxaban (XARELTO) 20 MG TABS tablet   lovastatin (MEVACOR) 40 MG tablet   atenolol (TENORMIN) 50 MG tablet   Other Relevant Orders   Comprehensive metabolic panel   Lipid panel   CBC with Differential/Platelet   Urinalysis, Routine w reflex microscopic (not at [facility])   TSH    Other Visit Diagnoses    PE (physical exam), annual            Follow up plan: Return in about 6 months (around 04/27/2016), or if symptoms worsen or fail to improve, for med check BMP, lipids, alt, ast.

## 2015-10-26 NOTE — Assessment & Plan Note (Signed)
The current medical regimen is effective;  continue present plan and medications.  

## 2015-10-27 ENCOUNTER — Telehealth: Payer: Self-pay

## 2015-10-27 DIAGNOSIS — E785 Hyperlipidemia, unspecified: Secondary | ICD-10-CM

## 2015-10-27 LAB — CBC WITH DIFFERENTIAL/PLATELET
BASOS: 0 %
Basophils Absolute: 0 10*3/uL (ref 0.0–0.2)
EOS (ABSOLUTE): 0.2 10*3/uL (ref 0.0–0.4)
Eos: 3 %
Hematocrit: 42.9 % (ref 37.5–51.0)
Hemoglobin: 14 g/dL (ref 12.6–17.7)
Immature Grans (Abs): 0 10*3/uL (ref 0.0–0.1)
Immature Granulocytes: 0 %
Lymphocytes Absolute: 1.4 10*3/uL (ref 0.7–3.1)
Lymphs: 19 %
MCH: 28.6 pg (ref 26.6–33.0)
MCHC: 32.6 g/dL (ref 31.5–35.7)
MCV: 88 fL (ref 79–97)
MONOS ABS: 0.7 10*3/uL (ref 0.1–0.9)
Monocytes: 9 %
NEUTROS ABS: 5.4 10*3/uL (ref 1.4–7.0)
Neutrophils: 69 %
PLATELETS: 268 10*3/uL (ref 150–379)
RBC: 4.89 x10E6/uL (ref 4.14–5.80)
RDW: 14.6 % (ref 12.3–15.4)
WBC: 7.8 10*3/uL (ref 3.4–10.8)

## 2015-10-27 LAB — LIPID PANEL
Chol/HDL Ratio: 2.9 ratio units (ref 0.0–5.0)
Cholesterol, Total: 134 mg/dL (ref 100–199)
HDL: 47 mg/dL (ref >39)
LDL CALC: 71 mg/dL (ref 0–99)
Triglycerides: 81 mg/dL (ref 0–149)
VLDL CHOLESTEROL CAL: 16 mg/dL (ref 5–40)

## 2015-10-27 LAB — COMPREHENSIVE METABOLIC PANEL
ALBUMIN: 4.3 g/dL (ref 3.5–4.8)
ALT: 10 IU/L (ref 0–44)
AST: 18 IU/L (ref 0–40)
Albumin/Globulin Ratio: 1.9 (ref 1.1–2.5)
Alkaline Phosphatase: 73 IU/L (ref 39–117)
BILIRUBIN TOTAL: 0.7 mg/dL (ref 0.0–1.2)
BUN / CREAT RATIO: 10 (ref 10–22)
BUN: 8 mg/dL (ref 8–27)
CALCIUM: 9.2 mg/dL (ref 8.6–10.2)
CO2: 22 mmol/L (ref 18–29)
CREATININE: 0.82 mg/dL (ref 0.76–1.27)
Chloride: 100 mmol/L (ref 96–106)
GFR, EST AFRICAN AMERICAN: 103 mL/min/{1.73_m2} (ref >59)
GFR, EST NON AFRICAN AMERICAN: 89 mL/min/{1.73_m2} (ref >59)
GLUCOSE: 79 mg/dL (ref 65–99)
Globulin, Total: 2.3 g/dL (ref 1.5–4.5)
Potassium: 4.4 mmol/L (ref 3.5–5.2)
Sodium: 142 mmol/L (ref 134–144)
TOTAL PROTEIN: 6.6 g/dL (ref 6.0–8.5)

## 2015-10-27 LAB — TSH: TSH: 2.76 u[IU]/mL (ref 0.450–4.500)

## 2015-10-27 LAB — PSA: Prostate Specific Ag, Serum: 0.3 ng/mL (ref 0.0–4.0)

## 2015-10-27 NOTE — Telephone Encounter (Signed)
Pharmacy fax states patient says he should be on 20mg  Lovastatin

## 2015-10-27 NOTE — Telephone Encounter (Signed)
Double entry.

## 2015-10-30 ENCOUNTER — Encounter: Payer: Self-pay | Admitting: Family Medicine

## 2015-10-30 MED ORDER — LOVASTATIN 20 MG PO TABS: 20.0000 mg | ORAL_TABLET | Freq: Every day | ORAL | Status: DC

## 2016-01-23 DIAGNOSIS — Z96652 Presence of left artificial knee joint: Secondary | ICD-10-CM | POA: Diagnosis not present

## 2016-02-16 ENCOUNTER — Encounter: Payer: Self-pay | Admitting: Unknown Physician Specialty

## 2016-02-16 ENCOUNTER — Ambulatory Visit (INDEPENDENT_AMBULATORY_CARE_PROVIDER_SITE_OTHER): Payer: Medicare Other | Admitting: Unknown Physician Specialty

## 2016-02-16 VITALS — BP 124/78 | HR 58 | Temp 98.5°F | Ht 68.7 in | Wt 284.4 lb

## 2016-02-16 DIAGNOSIS — B029 Zoster without complications: Secondary | ICD-10-CM

## 2016-02-16 MED ORDER — VALACYCLOVIR HCL 1 G PO TABS: 1000.0000 mg | ORAL_TABLET | Freq: Three times a day (TID) | ORAL | Status: DC

## 2016-02-16 NOTE — Patient Instructions (Signed)

## 2016-02-16 NOTE — Progress Notes (Signed)
BP 124/78 mmHg  Pulse 58  Temp(Src) 98.5 F (36.9 C)  Ht 5' 8.7" (1.745 m)  Wt 284 lb 6.4 oz (129.003 kg)  BMI 42.37 kg/m2  SpO2 97%   Subjective:    Patient ID: Hector Bennett, male    DOB: [birth year], 70 y.o.   MRN: [id number]  HPI: Hector Bennett is a 70 y.o. male  Chief Complaint  Patient presents with  . Rash    pt states he has a rash on his mid back and left side. States it came up this past weekend and is affraid it is shingles    Rash Pt states he had a rash on the right flank.  States it started about a week ago.  No pain. No fever.    Relevant past medical, surgical, family and social history reviewed and updated as indicated. Interim medical history since our last visit reviewed. Allergies and medications reviewed and updated.  Review of Systems  Per HPI unless specifically indicated above     Objective:    BP 124/78 mmHg  Pulse 58  Temp(Src) 98.5 F (36.9 C)  Ht 5' 8.7" (1.745 m)  Wt 284 lb 6.4 oz (129.003 kg)  BMI 42.37 kg/m2  SpO2 97%  Wt Readings from Last 3 Encounters:  02/16/16 284 lb 6.4 oz (129.003 kg)  10/26/15 291 lb (131.997 kg)  08/29/15 286 lb (129.729 kg)    Physical Exam  Constitutional: He is oriented to person, place, and time. He appears well-developed and well-nourished. No distress.  HENT:  Head: Normocephalic and atraumatic.  Eyes: Conjunctivae and lids are normal. Right eye exhibits no discharge. Left eye exhibits no discharge. No scleral icterus.  Cardiovascular: Normal rate.   Pulmonary/Chest: Effort normal.  Abdominal: Normal appearance. There is no splenomegaly or hepatomegaly.  Musculoskeletal: Normal range of motion.  Neurological: He is alert and oriented to person, place, and time.  Skin: Skin is intact. No rash noted. No pallor.  Pt with blistering rash right flank in a dermtomal distribution  Psychiatric: He has a normal mood and affect. His behavior is normal. Judgment and thought content normal.     Results for orders placed or performed in visit on 10/26/15  Comprehensive metabolic panel  Result Value Ref Range   Glucose 79 65 - 99 mg/dL   BUN 8 8 - 27 mg/dL   Creatinine, Ser 0.82 0.76 - 1.27 mg/dL   GFR calc non Af Amer 89 >59 mL/min/1.73   GFR calc Af Amer 103 >59 mL/min/1.73   BUN/Creatinine Ratio 10 10 - 22   Sodium 142 134 - 144 mmol/L   Potassium 4.4 3.5 - 5.2 mmol/L   Chloride 100 96 - 106 mmol/L   CO2 22 18 - 29 mmol/L   Calcium 9.2 8.6 - 10.2 mg/dL   Total Protein 6.6 6.0 - 8.5 g/dL   Albumin 4.3 3.5 - 4.8 g/dL   Globulin, Total 2.3 1.5 - 4.5 g/dL   Albumin/Globulin Ratio 1.9 1.1 - 2.5   Bilirubin Total 0.7 0.0 - 1.2 mg/dL   Alkaline Phosphatase 73 39 - 117 IU/L   AST 18 0 - 40 IU/L   ALT 10 0 - 44 IU/L  Lipid panel  Result Value Ref Range   Cholesterol, Total 134 100 - 199 mg/dL   Triglycerides 81 0 - 149 mg/dL   HDL 47 >39 mg/dL   VLDL Cholesterol Cal 16 5 - 40 mg/dL   LDL Calculated 71 0 - 99 mg/dL  Chol/HDL Ratio 2.9 0.0 - 5.0 ratio units  CBC with Differential/Platelet  Result Value Ref Range   WBC 7.8 3.4 - 10.8 x10E3/uL   RBC 4.89 4.14 - 5.80 x10E6/uL   Hemoglobin 14.0 12.6 - 17.7 g/dL   Hematocrit 42.9 37.5 - 51.0 %   MCV 88 79 - 97 fL   MCH 28.6 26.6 - 33.0 pg   MCHC 32.6 31.5 - 35.7 g/dL   RDW 14.6 12.3 - 15.4 %   Platelets 268 150 - 379 x10E3/uL   Neutrophils 69 %   Lymphs 19 %   Monocytes 9 %   Eos 3 %   Basos 0 %   Neutrophils Absolute 5.4 1.4 - 7.0 x10E3/uL   Lymphocytes Absolute 1.4 0.7 - 3.1 x10E3/uL   Monocytes Absolute 0.7 0.1 - 0.9 x10E3/uL   EOS (ABSOLUTE) 0.2 0.0 - 0.4 x10E3/uL   Basophils Absolute 0.0 0.0 - 0.2 x10E3/uL   Immature Granulocytes 0 %   Immature Grans (Abs) 0.0 0.0 - 0.1 x10E3/uL  Urinalysis, Routine w reflex microscopic (not at [facility])  Result Value Ref Range   Specific Gravity, UA 1.015 1.005 - 1.030   pH, UA 6.5 5.0 - 7.5   Color, UA Yellow Yellow   Appearance Ur Clear Clear   Leukocytes, UA Negative  Negative   Protein, UA Negative Negative/Trace   Glucose, UA Negative Negative   Ketones, UA Negative Negative   RBC, UA Negative Negative   Bilirubin, UA Negative Negative   Urobilinogen, Ur 1.0 0.2 - 1.0 mg/dL   Nitrite, UA Negative Negative  PSA  Result Value Ref Range   Prostate Specific Ag, Serum 0.3 0.0 - 4.0 ng/mL  TSH  Result Value Ref Range   TSH 2.760 0.450 - 4.500 uIU/mL      Assessment & Plan:   Problem List Items Addressed This Visit    None    Visit Diagnoses    Herpes zoster    -  Primary    Rx for Valtrex 1 gram TID    Relevant Medications    valACYclovir (VALTREX) 1000 MG tablet        Follow up plan: Return if symptoms worsen or fail to improve.

## 2016-04-30 ENCOUNTER — Ambulatory Visit (INDEPENDENT_AMBULATORY_CARE_PROVIDER_SITE_OTHER): Payer: Medicare Other | Admitting: Family Medicine

## 2016-04-30 ENCOUNTER — Encounter: Payer: Self-pay | Admitting: Family Medicine

## 2016-04-30 VITALS — BP 105/70 | HR 58 | Temp 98.3°F | Wt 284.0 lb

## 2016-04-30 DIAGNOSIS — M1990 Unspecified osteoarthritis, unspecified site: Secondary | ICD-10-CM

## 2016-04-30 DIAGNOSIS — I10 Essential (primary) hypertension: Secondary | ICD-10-CM

## 2016-04-30 DIAGNOSIS — E785 Hyperlipidemia, unspecified: Secondary | ICD-10-CM | POA: Diagnosis not present

## 2016-04-30 LAB — LP+ALT+AST PICCOLO, WAIVED
ALT (SGPT) Piccolo, Waived: 18 U/L (ref 10–47)
AST (SGOT) Piccolo, Waived: 26 U/L (ref 11–38)
CHOLESTEROL PICCOLO, WAIVED: 152 mg/dL (ref <200)
Chol/HDL Ratio Piccolo,Waive: 2.7 mg/dL
HDL CHOL PICCOLO, WAIVED: 57 mg/dL — AB (ref >59)
LDL CHOL CALC PICCOLO WAIVED: 71 mg/dL (ref <100)
TRIGLYCERIDES PICCOLO,WAIVED: 124 mg/dL (ref <150)
VLDL CHOL CALC PICCOLO,WAIVE: 25 mg/dL (ref <30)

## 2016-04-30 MED ORDER — BENAZEPRIL-HYDROCHLOROTHIAZIDE 20-25 MG PO TABS: 1.0000 | ORAL_TABLET | Freq: Every day | ORAL | 2 refills | Status: DC

## 2016-04-30 MED ORDER — CELECOXIB 200 MG PO CAPS: 200.0000 mg | ORAL_CAPSULE | Freq: Every day | ORAL | 6 refills | Status: DC

## 2016-04-30 NOTE — Assessment & Plan Note (Signed)
The current medical regimen is effective;  continue present plan and medications.  

## 2016-04-30 NOTE — Assessment & Plan Note (Signed)
Discuss back pain and arthritis use of Celebrex occasional holidays, occasional Tylenol and/or other medicine if needed. Discuss proper lifting etc.

## 2016-04-30 NOTE — Progress Notes (Signed)
BP 105/70 (BP Location: Left Arm, Patient Position: Sitting, Cuff Size: Normal)   Pulse (!) 58   Temp 98.3 F (36.8 C)   Wt 284 lb (128.8 kg)   SpO2 98%   BMI 42.31 kg/m    Subjective:    Patient ID: Hector Bennett, male    DOB: 1945-09-09, 70 y.o.   MRN: [id number]  HPI: Hector Bennett is a 70 y.o. male  Chief Complaint  Patient presents with  . Hypertension  . Hyperlipidemia  Patient recheck hypertension hypercholesterol doing well with medications no complaints or concerns taken faithfully without problems and good control.  Patient also with concerns about arthritis especially low back area of arthritis wanting some medication that can maybe helped to take from time to time has had Celebrex recommended. Renal function today is pending will be back later this week reviewed from 6 months ago and normal Discuss use of Celebrex and that okay to take.    Relevant past medical, surgical, family and social history reviewed and updated as indicated. Interim medical history since our last visit reviewed. Allergies and medications reviewed and updated.  Review of Systems  Constitutional: Negative.   Cardiovascular: Negative.     Per HPI unless specifically indicated above     Objective:    BP 105/70 (BP Location: Left Arm, Patient Position: Sitting, Cuff Size: Normal)   Pulse (!) 58   Temp 98.3 F (36.8 C)   Wt 284 lb (128.8 kg)   SpO2 98%   BMI 42.31 kg/m   Wt Readings from Last 3 Encounters:  04/30/16 284 lb (128.8 kg)  02/16/16 284 lb 6.4 oz (129 kg)  10/26/15 291 lb (132 kg)    Physical Exam  Constitutional: He is oriented to person, place, and time. He appears well-developed and well-nourished. No distress.  HENT:  Head: Normocephalic and atraumatic.  Right Ear: Hearing normal.  Left Ear: Hearing normal.  Nose: Nose normal.  Eyes: Conjunctivae and lids are normal. Right eye exhibits no discharge. Left eye exhibits no discharge. No scleral icterus.    Cardiovascular: Normal rate, regular rhythm and normal heart sounds.   Pulmonary/Chest: Effort normal and breath sounds normal. No respiratory distress.  Musculoskeletal: Normal range of motion.  Neurological: He is alert and oriented to person, place, and time.  Skin: Skin is intact. No rash noted.  Psychiatric: He has a normal mood and affect. His speech is normal and behavior is normal. Judgment and thought content normal. Cognition and memory are normal.    Results for orders placed or performed in visit on 10/26/15  Comprehensive metabolic panel  Result Value Ref Range   Glucose 79 65 - 99 mg/dL   BUN 8 8 - 27 mg/dL   Creatinine, Ser 0.82 0.76 - 1.27 mg/dL   GFR calc non Af Amer 89 >59 mL/min/1.73   GFR calc Af Amer 103 >59 mL/min/1.73   BUN/Creatinine Ratio 10 10 - 22   Sodium 142 134 - 144 mmol/L   Potassium 4.4 3.5 - 5.2 mmol/L   Chloride 100 96 - 106 mmol/L   CO2 22 18 - 29 mmol/L   Calcium 9.2 8.6 - 10.2 mg/dL   Total Protein 6.6 6.0 - 8.5 g/dL   Albumin 4.3 3.5 - 4.8 g/dL   Globulin, Total 2.3 1.5 - 4.5 g/dL   Albumin/Globulin Ratio 1.9 1.1 - 2.5   Bilirubin Total 0.7 0.0 - 1.2 mg/dL   Alkaline Phosphatase 73 39 - 117 IU/L  AST 18 0 - 40 IU/L   ALT 10 0 - 44 IU/L  Lipid panel  Result Value Ref Range   Cholesterol, Total 134 100 - 199 mg/dL   Triglycerides 81 0 - 149 mg/dL   HDL 47 >39 mg/dL   VLDL Cholesterol Cal 16 5 - 40 mg/dL   LDL Calculated 71 0 - 99 mg/dL   Chol/HDL Ratio 2.9 0.0 - 5.0 ratio units  CBC with Differential/Platelet  Result Value Ref Range   WBC 7.8 3.4 - 10.8 x10E3/uL   RBC 4.89 4.14 - 5.80 x10E6/uL   Hemoglobin 14.0 12.6 - 17.7 g/dL   Hematocrit 42.9 37.5 - 51.0 %   MCV 88 79 - 97 fL   MCH 28.6 26.6 - 33.0 pg   MCHC 32.6 31.5 - 35.7 g/dL   RDW 14.6 12.3 - 15.4 %   Platelets 268 150 - 379 x10E3/uL   Neutrophils 69 %   Lymphs 19 %   Monocytes 9 %   Eos 3 %   Basos 0 %   Neutrophils Absolute 5.4 1.4 - 7.0 x10E3/uL   Lymphocytes  Absolute 1.4 0.7 - 3.1 x10E3/uL   Monocytes Absolute 0.7 0.1 - 0.9 x10E3/uL   EOS (ABSOLUTE) 0.2 0.0 - 0.4 x10E3/uL   Basophils Absolute 0.0 0.0 - 0.2 x10E3/uL   Immature Granulocytes 0 %   Immature Grans (Abs) 0.0 0.0 - 0.1 x10E3/uL  Urinalysis, Routine w reflex microscopic (not at [facility])  Result Value Ref Range   Specific Gravity, UA 1.015 1.005 - 1.030   pH, UA 6.5 5.0 - 7.5   Color, UA Yellow Yellow   Appearance Ur Clear Clear   Leukocytes, UA Negative Negative   Protein, UA Negative Negative/Trace   Glucose, UA Negative Negative   Ketones, UA Negative Negative   RBC, UA Negative Negative   Bilirubin, UA Negative Negative   Urobilinogen, Ur 1.0 0.2 - 1.0 mg/dL   Nitrite, UA Negative Negative  PSA  Result Value Ref Range   Prostate Specific Ag, Serum 0.3 0.0 - 4.0 ng/mL  TSH  Result Value Ref Range   TSH 2.760 0.450 - 4.500 uIU/mL      Assessment & Plan:   Problem List Items Addressed This Visit      Cardiovascular and Mediastinum   Hypertension - Primary    The current medical regimen is effective;  continue present plan and medications.       Relevant Medications   benazepril-hydrochlorthiazide (LOTENSIN HCT) 20-25 MG tablet   Other Relevant Orders   LP+ALT+AST Piccolo, Waived   Basic metabolic panel     Musculoskeletal and Integument   Arthritis    Discuss back pain and arthritis use of Celebrex occasional holidays, occasional Tylenol and/or other medicine if needed. Discuss proper lifting etc.      Relevant Medications   celecoxib (CELEBREX) 200 MG capsule     Other   Hyperlipidemia    The current medical regimen is effective;  continue present plan and medications.       Relevant Medications   benazepril-hydrochlorthiazide (LOTENSIN HCT) 20-25 MG tablet   Other Relevant Orders   LP+ALT+AST Piccolo, Waived   Basic metabolic panel    Other Visit Diagnoses   None.      Follow up plan: Return in about 6 months (around 10/28/2016) for Physical  Exam.

## 2016-05-01 ENCOUNTER — Encounter: Payer: Self-pay | Admitting: Family Medicine

## 2016-05-01 LAB — BASIC METABOLIC PANEL
BUN/Creatinine Ratio: 8 — ABNORMAL LOW (ref 10–24)
BUN: 10 mg/dL (ref 8–27)
CALCIUM: 9.7 mg/dL (ref 8.6–10.2)
CHLORIDE: 101 mmol/L (ref 96–106)
CO2: 25 mmol/L (ref 18–29)
Creatinine, Ser: 1.19 mg/dL (ref 0.76–1.27)
GFR calc Af Amer: 70 mL/min/{1.73_m2} (ref >59)
GFR calc non Af Amer: 61 mL/min/{1.73_m2} (ref >59)
Glucose: 111 mg/dL — ABNORMAL HIGH (ref 65–99)
POTASSIUM: 4.4 mmol/L (ref 3.5–5.2)
Sodium: 141 mmol/L (ref 134–144)

## 2016-05-22 ENCOUNTER — Encounter: Payer: Self-pay | Admitting: Family Medicine

## 2016-10-28 ENCOUNTER — Encounter: Payer: Medicare Other | Admitting: Family Medicine

## 2016-11-14 ENCOUNTER — Ambulatory Visit (INDEPENDENT_AMBULATORY_CARE_PROVIDER_SITE_OTHER): Payer: Medicare Other | Admitting: Family Medicine

## 2016-11-14 ENCOUNTER — Encounter: Payer: Self-pay | Admitting: Family Medicine

## 2016-11-14 VITALS — BP 112/71 | HR 83 | Ht 70.16 in | Wt 297.0 lb

## 2016-11-14 DIAGNOSIS — I481 Persistent atrial fibrillation: Secondary | ICD-10-CM | POA: Diagnosis not present

## 2016-11-14 DIAGNOSIS — Z1329 Encounter for screening for other suspected endocrine disorder: Secondary | ICD-10-CM

## 2016-11-14 DIAGNOSIS — I10 Essential (primary) hypertension: Secondary | ICD-10-CM

## 2016-11-14 DIAGNOSIS — I4819 Other persistent atrial fibrillation: Secondary | ICD-10-CM

## 2016-11-14 DIAGNOSIS — M1990 Unspecified osteoarthritis, unspecified site: Secondary | ICD-10-CM

## 2016-11-14 DIAGNOSIS — R0989 Other specified symptoms and signs involving the circulatory and respiratory systems: Secondary | ICD-10-CM | POA: Diagnosis not present

## 2016-11-14 DIAGNOSIS — Z125 Encounter for screening for malignant neoplasm of prostate: Secondary | ICD-10-CM | POA: Diagnosis not present

## 2016-11-14 DIAGNOSIS — N400 Enlarged prostate without lower urinary tract symptoms: Secondary | ICD-10-CM | POA: Diagnosis not present

## 2016-11-14 DIAGNOSIS — Z0000 Encounter for general adult medical examination without abnormal findings: Secondary | ICD-10-CM

## 2016-11-14 DIAGNOSIS — E785 Hyperlipidemia, unspecified: Secondary | ICD-10-CM

## 2016-11-14 DIAGNOSIS — Z13220 Encounter for screening for lipoid disorders: Secondary | ICD-10-CM | POA: Diagnosis not present

## 2016-11-14 DIAGNOSIS — E66813 Obesity, class 3: Secondary | ICD-10-CM

## 2016-11-14 LAB — URINALYSIS, ROUTINE W REFLEX MICROSCOPIC
Bilirubin, UA: NEGATIVE
Glucose, UA: NEGATIVE
KETONES UA: NEGATIVE
LEUKOCYTES UA: NEGATIVE
NITRITE UA: NEGATIVE
Protein, UA: NEGATIVE
SPEC GRAV UA: 1.015 (ref 1.005–1.030)
Urobilinogen, Ur: 0.2 mg/dL (ref 0.2–1.0)
pH, UA: 6.5 (ref 5.0–7.5)

## 2016-11-14 LAB — MICROSCOPIC EXAMINATION
BACTERIA UA: NONE SEEN
WBC, UA: NONE SEEN /hpf (ref 0–?)

## 2016-11-14 MED ORDER — BENAZEPRIL-HYDROCHLOROTHIAZIDE 20-25 MG PO TABS: 1.0000 | ORAL_TABLET | Freq: Every day | ORAL | 4 refills | Status: DC

## 2016-11-14 MED ORDER — RIVAROXABAN 20 MG PO TABS: 20.0000 mg | ORAL_TABLET | Freq: Every day | ORAL | 4 refills | Status: DC

## 2016-11-14 MED ORDER — ATENOLOL 50 MG PO TABS: 50.0000 mg | ORAL_TABLET | ORAL | 4 refills | Status: DC

## 2016-11-14 MED ORDER — LOVASTATIN 20 MG PO TABS: 20.0000 mg | ORAL_TABLET | Freq: Every day | ORAL | 4 refills | Status: DC

## 2016-11-14 MED ORDER — CELECOXIB 200 MG PO CAPS: 200.0000 mg | ORAL_CAPSULE | Freq: Every day | ORAL | 6 refills | Status: DC

## 2016-11-14 NOTE — Assessment & Plan Note (Signed)
The current medical regimen is effective;  continue present plan and medications. Doing well with Xarelto

## 2016-11-14 NOTE — Assessment & Plan Note (Signed)
Discuss arthritis care and treatment risk and will taking Xarelto and Celebrex patient will limit Celebrex use and be aware of GI bleeding.

## 2016-11-14 NOTE — Assessment & Plan Note (Signed)
>>  ASSESSMENT AND PLAN FOR OBESITY, CLASS III, BMI 40-49.9 (MORBID OBESITY) (HCC) WRITTEN ON 11/14/2016  3:01 PM BY Lorin Gawron A, MD  Discuss weight loss nutrition diet.

## 2016-11-14 NOTE — Assessment & Plan Note (Signed)
On exam No pulses felt in feet patient with no symptoms Will refer to [facility] to further evaluate

## 2016-11-14 NOTE — Assessment & Plan Note (Signed)
stable °

## 2016-11-14 NOTE — Assessment & Plan Note (Signed)
The current medical regimen is effective;  continue present plan and medications.  

## 2016-11-14 NOTE — Progress Notes (Signed)
BP 112/71   Pulse 83   Ht 5' 10.16" (1.782 m)   Wt 297 lb (134.7 kg)   SpO2 95%   BMI 42.42 kg/m    Subjective:    Patient ID: Hector Bennett, male    DOB: 04-Mar-1946, 71 y.o.   MRN: [id number]  HPI: Hector Bennett is a 71 y.o. male  Chief Complaint  Patient presents with  . Annual Exam  AWV metrics met. Patient all in all doing well reviewed medical problems blood pressure doing well no complaints taking medications faithfully. Same for lovastatin. Taking Xarelto no bleeding bruising issues no complaints from atrial fibrillation rapid heartbeat etc. Had knee replacement doing well. Has continued to struggle with weight loss and instead has had weight gain.  Relevant past medical, surgical, family and social history reviewed and updated as indicated. Interim medical history since our last visit reviewed. Allergies and medications reviewed and updated.  Review of Systems  Constitutional: Negative.   HENT: Negative.   Eyes: Negative.   Respiratory: Negative.   Cardiovascular: Negative.   Gastrointestinal: Negative.   Endocrine: Negative.   Genitourinary: Negative.   Musculoskeletal: Negative.   Skin: Negative.   Allergic/Immunologic: Negative.   Neurological: Negative.   Hematological: Negative.   Psychiatric/Behavioral: Negative.     Per HPI unless specifically indicated above     Objective:    BP 112/71   Pulse 83   Ht 5' 10.16" (1.782 m)   Wt 297 lb (134.7 kg)   SpO2 95%   BMI 42.42 kg/m   Wt Readings from Last 3 Encounters:  11/14/16 297 lb (134.7 kg)  04/30/16 284 lb (128.8 kg)  02/16/16 284 lb 6.4 oz (129 kg)    Physical Exam  Constitutional: He is oriented to person, place, and time. He appears well-developed and well-nourished.  HENT:  Head: Normocephalic and atraumatic.  Right Ear: External ear normal.  Left Ear: External ear normal.  Eyes: Conjunctivae and EOM are normal. Pupils are equal, round, and reactive to light.  Neck: Normal  range of motion. Neck supple.  Cardiovascular: Normal rate, regular rhythm, normal heart sounds and intact distal pulses.   Pulmonary/Chest: Effort normal and breath sounds normal.  Abdominal: Soft. Bowel sounds are normal. There is no splenomegaly or hepatomegaly.  Genitourinary: Rectum normal, prostate normal and penis normal.  Musculoskeletal: Normal range of motion.  Absent peripheral pulses. No claudication complaints  Neurological: He is alert and oriented to person, place, and time. He has normal reflexes.  Skin: No rash noted. No erythema.  Psychiatric: He has a normal mood and affect. His behavior is normal. Judgment and thought content normal.    Results for orders placed or performed in visit on 04/30/16  LP+ALT+AST Piccolo, [employer]  Result Value Ref Range   ALT (SGPT) Piccolo, Waived 18 10 - 47 U/L   AST (SGOT) Piccolo, Waived 26 11 - 38 U/L   Cholesterol Piccolo, Waived 152 <200 mg/dL   HDL Chol Piccolo, Waived 57 (L) >59 mg/dL   Triglycerides Piccolo,Waived 124 <150 mg/dL   Chol/HDL Ratio Piccolo,Waive 2.7 mg/dL   LDL Chol Calc Piccolo Waived 71 <100 mg/dL   VLDL Chol Calc Piccolo,Waive 25 <30 mg/dL  Basic metabolic panel  Result Value Ref Range   Glucose 111 (H) 65 - 99 mg/dL   BUN 10 8 - 27 mg/dL   Creatinine, Ser 1.19 0.76 - 1.27 mg/dL   GFR calc non Af Amer 61 >59 mL/min/1.73   GFR calc Af  Amer 70 >59 mL/min/1.73   BUN/Creatinine Ratio 8 (L) 10 - 24   Sodium 141 134 - 144 mmol/L   Potassium 4.4 3.5 - 5.2 mmol/L   Chloride 101 96 - 106 mmol/L   CO2 25 18 - 29 mmol/L   Calcium 9.7 8.6 - 10.2 mg/dL      Assessment & Plan:   Problem List Items Addressed This Visit      Cardiovascular and Mediastinum   Hypertension    The current medical regimen is effective;  continue present plan and medications.       Relevant Medications   atenolol (TENORMIN) 50 MG tablet   lovastatin (MEVACOR) 20 MG tablet   rivaroxaban (XARELTO) 20 MG TABS tablet    benazepril-hydrochlorthiazide (LOTENSIN HCT) 20-25 MG tablet   Other Relevant Orders   CBC with Differential/Platelet   Comprehensive metabolic panel   Urinalysis, Routine w reflex microscopic   A-fib (HCC)    The current medical regimen is effective;  continue present plan and medications. Doing well with Xarelto       Relevant Medications   atenolol (TENORMIN) 50 MG tablet   lovastatin (MEVACOR) 20 MG tablet   rivaroxaban (XARELTO) 20 MG TABS tablet   benazepril-hydrochlorthiazide (LOTENSIN HCT) 20-25 MG tablet   Other Relevant Orders   Urinalysis, Routine w reflex microscopic     Musculoskeletal and Integument   Arthritis    Discuss arthritis care and treatment risk and will taking Xarelto and Celebrex patient will limit Celebrex use and be aware of GI bleeding.      Relevant Medications   celecoxib (CELEBREX) 200 MG capsule     Genitourinary   BPH (benign prostatic hyperplasia)    stable      Relevant Orders   PSA     Other   Hyperlipidemia    The current medical regimen is effective;  continue present plan and medications.       Relevant Medications   atenolol (TENORMIN) 50 MG tablet   lovastatin (MEVACOR) 20 MG tablet   rivaroxaban (XARELTO) 20 MG TABS tablet   benazepril-hydrochlorthiazide (LOTENSIN HCT) 20-25 MG tablet   Other Relevant Orders   CBC with Differential/Platelet   Comprehensive metabolic panel   Urinalysis, Routine w reflex microscopic   Obesity, Class III, BMI 40-49.9 (morbid obesity) ([place])    Discuss weight loss nutrition diet.      Relevant Orders   Urinalysis, Routine w reflex microscopic   Diminished pulses in lower extremity    On exam No pulses felt in feet patient with no symptoms Will refer to [facility] to further evaluate      Relevant Orders   Ambulatory referral to Vascular Surgery    Other Visit Diagnoses    Annual physical exam    -  Primary   Relevant Orders   CBC with Differential/Platelet    Comprehensive metabolic panel   Lipid panel   PSA   TSH   Urinalysis, Routine w reflex microscopic   Screening cholesterol level       Relevant Orders   Lipid panel   Prostate cancer screening       Relevant Orders   PSA   Thyroid disorder screen       Relevant Orders   TSH       Follow up plan: Return in about 6 months (around 05/17/2017) for BMP,  Lipids, ALT, AST.

## 2016-11-14 NOTE — Assessment & Plan Note (Signed)
Discuss weight loss nutrition diet.

## 2016-11-15 LAB — COMPREHENSIVE METABOLIC PANEL
ALT: 17 IU/L (ref 0–44)
AST: 25 IU/L (ref 0–40)
Albumin/Globulin Ratio: 1.6 (ref 1.2–2.2)
Albumin: 4.4 g/dL (ref 3.5–4.8)
Alkaline Phosphatase: 68 IU/L (ref 39–117)
BILIRUBIN TOTAL: 1 mg/dL (ref 0.0–1.2)
BUN/Creatinine Ratio: 12 (ref 10–24)
BUN: 14 mg/dL (ref 8–27)
CALCIUM: 9.7 mg/dL (ref 8.6–10.2)
CHLORIDE: 98 mmol/L (ref 96–106)
CO2: 24 mmol/L (ref 18–29)
Creatinine, Ser: 1.2 mg/dL (ref 0.76–1.27)
GFR calc Af Amer: 69 mL/min/{1.73_m2} (ref >59)
GFR calc non Af Amer: 60 mL/min/{1.73_m2} (ref >59)
Globulin, Total: 2.7 g/dL (ref 1.5–4.5)
Glucose: 116 mg/dL — ABNORMAL HIGH (ref 65–99)
POTASSIUM: 4 mmol/L (ref 3.5–5.2)
Sodium: 139 mmol/L (ref 134–144)
Total Protein: 7.1 g/dL (ref 6.0–8.5)

## 2016-11-15 LAB — LIPID PANEL
CHOLESTEROL TOTAL: 179 mg/dL (ref 100–199)
Chol/HDL Ratio: 3.7 ratio units (ref 0.0–5.0)
HDL: 49 mg/dL (ref >39)
LDL Calculated: 112 mg/dL — ABNORMAL HIGH (ref 0–99)
TRIGLYCERIDES: 89 mg/dL (ref 0–149)
VLDL Cholesterol Cal: 18 mg/dL (ref 5–40)

## 2016-11-15 LAB — CBC WITH DIFFERENTIAL/PLATELET
BASOS: 0 %
Basophils Absolute: 0 10*3/uL (ref 0.0–0.2)
EOS (ABSOLUTE): 0.3 10*3/uL (ref 0.0–0.4)
Eos: 3 %
Hematocrit: 45.5 % (ref 37.5–51.0)
Hemoglobin: 15.4 g/dL (ref 13.0–17.7)
IMMATURE GRANULOCYTES: 0 %
Immature Grans (Abs): 0 10*3/uL (ref 0.0–0.1)
Lymphocytes Absolute: 1.5 10*3/uL (ref 0.7–3.1)
Lymphs: 17 %
MCH: 31.4 pg (ref 26.6–33.0)
MCHC: 33.8 g/dL (ref 31.5–35.7)
MCV: 93 fL (ref 79–97)
MONOS ABS: 0.6 10*3/uL (ref 0.1–0.9)
Monocytes: 7 %
Neutrophils Absolute: 6.2 10*3/uL (ref 1.4–7.0)
Neutrophils: 73 %
PLATELETS: 248 10*3/uL (ref 150–379)
RBC: 4.91 x10E6/uL (ref 4.14–5.80)
RDW: 13.9 % (ref 12.3–15.4)
WBC: 8.6 10*3/uL (ref 3.4–10.8)

## 2016-11-15 LAB — PSA: Prostate Specific Ag, Serum: 0.3 ng/mL (ref 0.0–4.0)

## 2016-11-15 LAB — TSH: TSH: 4.26 u[IU]/mL (ref 0.450–4.500)

## 2016-11-18 ENCOUNTER — Encounter: Payer: Self-pay | Admitting: Family Medicine

## 2016-12-03 ENCOUNTER — Ambulatory Visit (INDEPENDENT_AMBULATORY_CARE_PROVIDER_SITE_OTHER): Payer: Medicare Other | Admitting: Vascular Surgery

## 2016-12-03 ENCOUNTER — Encounter (INDEPENDENT_AMBULATORY_CARE_PROVIDER_SITE_OTHER): Payer: Self-pay | Admitting: Vascular Surgery

## 2016-12-03 VITALS — BP 108/68 | HR 57 | Resp 18 | Wt 297.0 lb

## 2016-12-03 DIAGNOSIS — M79605 Pain in left leg: Secondary | ICD-10-CM

## 2016-12-03 DIAGNOSIS — E785 Hyperlipidemia, unspecified: Secondary | ICD-10-CM | POA: Diagnosis not present

## 2016-12-03 DIAGNOSIS — M79604 Pain in right leg: Secondary | ICD-10-CM | POA: Diagnosis not present

## 2016-12-03 DIAGNOSIS — I10 Essential (primary) hypertension: Secondary | ICD-10-CM | POA: Diagnosis not present

## 2016-12-03 DIAGNOSIS — M7989 Other specified soft tissue disorders: Secondary | ICD-10-CM | POA: Diagnosis not present

## 2016-12-03 NOTE — Progress Notes (Signed)
Patient ID: Hector Bennett, male   DOB: 1945-08-30, 71 y.o.   MRN: [id number]  Chief Complaint  Patient presents with  . Follow-up    diminshed pulses    HPI Hector Bennett is a 71 y.o. male.  I am asked to see the patient by Dr. Jeananne Rama for evaluation of leg discoloration and diminished pulses.  The patient reports Chronic purplish discoloration and swelling particularly in the right lower leg. The left leg has some of the symptoms as well. He has a previous history of DVT and actually had a filter placed and removed several years ago. He reports aching and heaviness in the leg but not a lot of pain with walking at this point. His right leg does tire easily. He does not have ulceration or infection. He denies fever or chills. Elevation helps the discoloration and swelling slightly, this has gradually progressed over time.   Past Medical History:  Diagnosis Date  . A-fib ([place])   . Arthritis   . DVT (deep venous thrombosis) ([place])   . Hyperlipidemia   . Hypertension   . Personal history of thrombophlebitis   . Pulmonary embolism ([place]) 11/26/2007   Bilateral    Past Surgical History:  Procedure Laterality Date  . HAND SURGERY Left 11/09/2014  . JOINT REPLACEMENT Left    knee  . KNEE ARTHROPLASTY Left 07/31/2015   Procedure: COMPUTER ASSISTED TOTAL KNEE ARTHROPLASTY;  Surgeon: Dereck Leep, MD;  Location: [facility];  Service: Orthopedics;  Laterality: Left;  . KNEE SURGERY  1975  . PERIPHERAL VASCULAR CATHETERIZATION N/A 07/25/2015   Procedure: IVC Filter Insertion;  Surgeon: Katha Cabal, MD;  Location: [facility];  Service: Cardiovascular;  Laterality: N/A;  . PERIPHERAL VASCULAR CATHETERIZATION N/A 08/29/2015   Procedure: IVC Filter Removal;  Surgeon: Katha Cabal, MD;  Location: [facility];  Service: Cardiovascular;  Laterality: N/A;    Family History  Problem Relation Age of Onset  . Hypertension Mother   . Hypertension Brother   . Diabetes  Brother   . Dementia Brother   No bleeding or clotting disorders  Social History Social History  Substance Use Topics  . Smoking status: Never Smoker  . Smokeless tobacco: Never Used  . Alcohol use 4.2 oz/week    7 Cans of beer per week  No IVDU  No Known Allergies  Current Outpatient Prescriptions  Medication Sig Dispense Refill  . acetaminophen (RA ACETAMINOPHEN) 650 MG CR tablet Take 650 mg by mouth every 8 (eight) hours as needed.    Marland Kitchen atenolol (TENORMIN) 50 MG tablet Take 1 tablet (50 mg total) by mouth every morning. 90 tablet 4  . benazepril-hydrochlorthiazide (LOTENSIN HCT) 20-25 MG tablet Take 1 tablet by mouth daily. 90 tablet 4  . celecoxib (CELEBREX) 200 MG capsule Take 1 capsule (200 mg total) by mouth daily. 30 capsule 6  . lovastatin (MEVACOR) 20 MG tablet Take 1 tablet (20 mg total) by mouth at bedtime. 90 tablet 4  . rivaroxaban (XARELTO) 20 MG TABS tablet Take 1 tablet (20 mg total) by mouth daily with supper. 90 tablet 4   No current facility-administered medications for this visit.       REVIEW OF SYSTEMS (Negative unless checked)  Constitutional: [] Weight loss  [] Fever  [] Chills Cardiac: [] Chest pain   [] Chest pressure   [] Palpitations   [] Shortness of breath when laying flat   [] Shortness of breath at rest   [x] Shortness of breath with exertion.  Vascular:  [] Pain in legs with walking   [] Pain in legs at rest   [] Pain in legs when laying flat   [] Claudication   [] Pain in feet when walking  [] Pain in feet at rest  [] Pain in feet when laying flat   [x] History of DVT   [] Phlebitis   [x] Swelling in legs   [x] Varicose veins   [] Non-healing ulcers Pulmonary:   [] Uses home oxygen   [] Productive cough   [] Hemoptysis   [] Wheeze  [] COPD   [] Asthma Neurologic:  [] Dizziness  [] Blackouts   [] Seizures   [] History of stroke   [] History of TIA  [] Aphasia   [] Temporary blindness   [] Dysphagia   [] Weakness or numbness in arms   [] Weakness or numbness in legs Musculoskeletal:   [x] Arthritis   [] Joint swelling   [] Joint pain   [] Low back pain Hematologic:  [] Easy bruising  [] Easy bleeding   [] Hypercoagulable state   [] Anemic  [] Hepatitis Gastrointestinal:  [] Blood in stool   [] Vomiting blood  [] Gastroesophageal reflux/heartburn   [] Abdominal pain Genitourinary:  [] Chronic kidney disease   [] Difficult urination  [] Frequent urination  [] Burning with urination   [] Hematuria Skin:  [] Rashes   [] Ulcers   [] Wounds Psychological:  [] History of anxiety   []  History of major depression.    Physical Exam BP 108/68   Pulse (!) 57   Resp 18   Wt 134.7 kg (297 lb)   BMI 42.42 kg/m  Gen:  WD/WN, NAD. Obese  Head: [place]/AT, No temporalis wasting.  Ear/Nose/Throat: Hearing grossly intact, nares w/o erythema or drainage, oropharynx w/o Erythema/Exudate Eyes: Conjunctiva clear, sclera non-icteric  Neck: trachea midline.  Pulmonary:  Good air movement, respirations not labored, no use of accessory muscles.  Cardiac: RRR, No JVD Vascular:  Vessel Right Left  Radial Palpable Palpable  Ulnar Palpable Palpable  Brachial Palpable Palpable  Carotid Palpable, without bruit Palpable, without bruit  Aorta Not palpable N/A  Femoral Palpable Palpable  Popliteal Palpable Palpable  PT Not Palpable Trace Palpable  DP 1+ Palpable Palpable   Gastrointestinal: soft, non-tender/non-distended. No guarding/reflex. No masses, surgical incisions, or scars. Musculoskeletal: M/S 5/5 throughout.  No deformity or atrophy. 2+ RLE edema, 1+ LLE edema. Moderate stasis changes present in both legs, worse on the right Neurologic: Sensation grossly intact in extremities.  Symmetrical.  Speech is fluent. Motor exam as listed above. Psychiatric: Judgment intact, Mood & affect appropriate for pt's clinical situation. Dermatologic: No rashes or ulcers noted.  No cellulitis or open wounds. Lymph : No Cervical, Axillary, or Inguinal lymphadenopathy.   Radiology No results found.  Labs Recent Results  (from the past 2160 hour(s))  CBC with Differential/Platelet     Status: None   Collection Time: 11/14/16  3:15 PM  Result Value Ref Range   WBC 8.6 3.4 - 10.8 x10E3/uL   RBC 4.91 4.14 - 5.80 x10E6/uL   Hemoglobin 15.4 13.0 - 17.7 g/dL   Hematocrit 45.5 37.5 - 51.0 %   MCV 93 79 - 97 fL   MCH 31.4 26.6 - 33.0 pg   MCHC 33.8 31.5 - 35.7 g/dL   RDW 13.9 12.3 - 15.4 %   Platelets 248 150 - 379 x10E3/uL   Neutrophils 73 Not Estab. %   Lymphs 17 Not Estab. %   Monocytes 7 Not Estab. %   Eos 3 Not Estab. %   Basos 0 Not Estab. %   Neutrophils Absolute 6.2 1.4 - 7.0 x10E3/uL   Lymphocytes Absolute 1.5 0.7 - 3.1 x10E3/uL  Monocytes Absolute 0.6 0.1 - 0.9 x10E3/uL   EOS (ABSOLUTE) 0.3 0.0 - 0.4 x10E3/uL   Basophils Absolute 0.0 0.0 - 0.2 x10E3/uL   Immature Granulocytes 0 Not Estab. %   Immature Grans (Abs) 0.0 0.0 - 0.1 x10E3/uL  Comprehensive metabolic panel     Status: Abnormal   Collection Time: 11/14/16  3:15 PM  Result Value Ref Range   Glucose 116 (H) 65 - 99 mg/dL   BUN 14 8 - 27 mg/dL   Creatinine, Ser 1.20 0.76 - 1.27 mg/dL   GFR calc non Af Amer 60 >59 mL/min/1.73   GFR calc Af Amer 69 >59 mL/min/1.73   BUN/Creatinine Ratio 12 10 - 24   Sodium 139 134 - 144 mmol/L   Potassium 4.0 3.5 - 5.2 mmol/L   Chloride 98 96 - 106 mmol/L   CO2 24 18 - 29 mmol/L   Calcium 9.7 8.6 - 10.2 mg/dL   Total Protein 7.1 6.0 - 8.5 g/dL   Albumin 4.4 3.5 - 4.8 g/dL   Globulin, Total 2.7 1.5 - 4.5 g/dL   Albumin/Globulin Ratio 1.6 1.2 - 2.2   Bilirubin Total 1.0 0.0 - 1.2 mg/dL   Alkaline Phosphatase 68 39 - 117 IU/L   AST 25 0 - 40 IU/L   ALT 17 0 - 44 IU/L  Lipid panel     Status: Abnormal   Collection Time: 11/14/16  3:15 PM  Result Value Ref Range   Cholesterol, Total 179 100 - 199 mg/dL   Triglycerides 89 0 - 149 mg/dL   HDL 49 >39 mg/dL   VLDL Cholesterol Cal 18 5 - 40 mg/dL   LDL Calculated 112 (H) 0 - 99 mg/dL   Chol/HDL Ratio 3.7 0.0 - 5.0 ratio units    Comment:                                    T. Chol/HDL Ratio                                             Men  Women                               1/2 Avg.Risk  3.4    3.3                                   Avg.Risk  5.0    4.4                                2X Avg.Risk  9.6    7.1                                3X Avg.Risk 23.4   11.0   PSA     Status: None   Collection Time: 11/14/16  3:15 PM  Result Value Ref Range   Prostate Specific Ag, Serum 0.3 0.0 - 4.0 ng/mL    Comment: Roche ECLIA methodology. According to the American Urological Association, Serum PSA should decrease and remain at undetectable  levels after radical prostatectomy. The AUA defines biochemical recurrence as an initial PSA value 0.2 ng/mL or greater followed by a subsequent confirmatory PSA value 0.2 ng/mL or greater. Values obtained with different assay methods or kits cannot be used interchangeably. Results cannot be interpreted as absolute evidence of the presence or absence of malignant disease.   TSH     Status: None   Collection Time: 11/14/16  3:15 PM  Result Value Ref Range   TSH 4.260 0.450 - 4.500 uIU/mL  Urinalysis, Routine w reflex microscopic     Status: Abnormal   Collection Time: 11/14/16  3:15 PM  Result Value Ref Range   Specific Gravity, UA 1.015 1.005 - 1.030   pH, UA 6.5 5.0 - 7.5   Color, UA Yellow Yellow   Appearance Ur Clear Clear   Leukocytes, UA Negative Negative   Protein, UA Negative Negative/Trace   Glucose, UA Negative Negative   Ketones, UA Negative Negative   RBC, UA Trace (A) Negative   Bilirubin, UA Negative Negative   Urobilinogen, Ur 0.2 0.2 - 1.0 mg/dL   Nitrite, UA Negative Negative   Microscopic Examination See below:   Microscopic Examination     Status: None   Collection Time: 11/14/16  3:15 PM  Result Value Ref Range   WBC, UA None seen 0 - 5 /hpf   RBC, UA 0-2 0 - 2 /hpf   Epithelial Cells (non renal) CANCELED     Comment: Test not performed  Result canceled by the  ancillary    Bacteria, UA None seen None seen/Few    Assessment/Plan:  Hyperlipidemia lipid control important in reducing the progression of atherosclerotic disease. Continue statin therapy   Obesity, Class III, BMI 40-49.9 (morbid obesity) ([place]) Certainly would be an exacerbating factor for swelling and discoloration of the legs. Weight loss and exercise would be of benefit.  Hypertension blood pressure control important in reducing the progression of atherosclerotic disease. On appropriate oral medications.   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 He certainly has multiple atherosclerotic risk factors and with difficult to palpate pedal pulses, I think it is reasonable to assess his arterial system as well as his venous system. These will be done at his convenience in the near future with noninvasive studies. We will see him back following the studies.  Swelling of limb This could be a post phlebitic situation although underlying superficial venous reflux could also be a possibility. Obesity contributing as well. Plan a venous reflux study at his convenience in the near future. See him back following the study. Recommend daily use of 20-30 mmHg compression stockings, leg elevation, and increased activity.      Leotis Pain 12/04/2016, 3:28 PM   This note was created with Dragon medical transcription system.  Any errors from dictation are unintentional.

## 2016-12-04 DIAGNOSIS — M7989 Other specified soft tissue disorders: Secondary | ICD-10-CM | POA: Insufficient documentation

## 2016-12-04 DIAGNOSIS — M79609 Pain in unspecified limb: Secondary | ICD-10-CM | POA: Insufficient documentation

## 2016-12-04 NOTE — Assessment & Plan Note (Signed)
>>  ASSESSMENT AND PLAN FOR OBESITY, CLASS III, BMI 40-49.9 (MORBID OBESITY) (HCC) WRITTEN ON 12/04/2016  3:25 PM BY Stephone Gum, SELINDA RAMAN, MD  Certainly would be an exacerbating factor for swelling and discoloration of the legs. Weight loss and exercise would be of benefit.

## 2016-12-04 NOTE — Assessment & Plan Note (Signed)
lipid control important in reducing the progression of atherosclerotic disease. Continue statin therapy  

## 2016-12-04 NOTE — Assessment & Plan Note (Signed)
Certainly would be an exacerbating factor for swelling and discoloration of the legs. Weight loss and exercise would be of benefit.

## 2016-12-04 NOTE — Assessment & Plan Note (Addendum)
This could be a post phlebitic situation although underlying superficial venous reflux could also be a possibility. Obesity contributing as well. Plan a venous reflux study at his convenience in the near future. See him back following the study. Recommend daily use of 20-30 mmHg compression stockings, leg elevation, and increased activity.

## 2016-12-04 NOTE — Assessment & Plan Note (Signed)
blood pressure control important in reducing the progression of atherosclerotic disease. On appropriate oral medications.  

## 2016-12-04 NOTE — Assessment & Plan Note (Signed)
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 He certainly has multiple atherosclerotic risk factors and with difficult to palpate pedal pulses, I think it is reasonable to assess his arterial system as well as his venous system. These will be done at his convenience in the near future with noninvasive studies. We will see him back following the studies.

## 2016-12-21 IMAGING — CR DG KNEE 1-2V PORT*L*
1 series · 2 of 2 positions shown · non-contrast
Comparison: None.

CLINICAL DATA: Postop from total knee replacement.

EXAM:
PORTABLE LEFT KNEE - 1-2 VIEW

[Series 1: ap · 0.17mm/px · 2 of 2 slices shown]
[im 1/2]
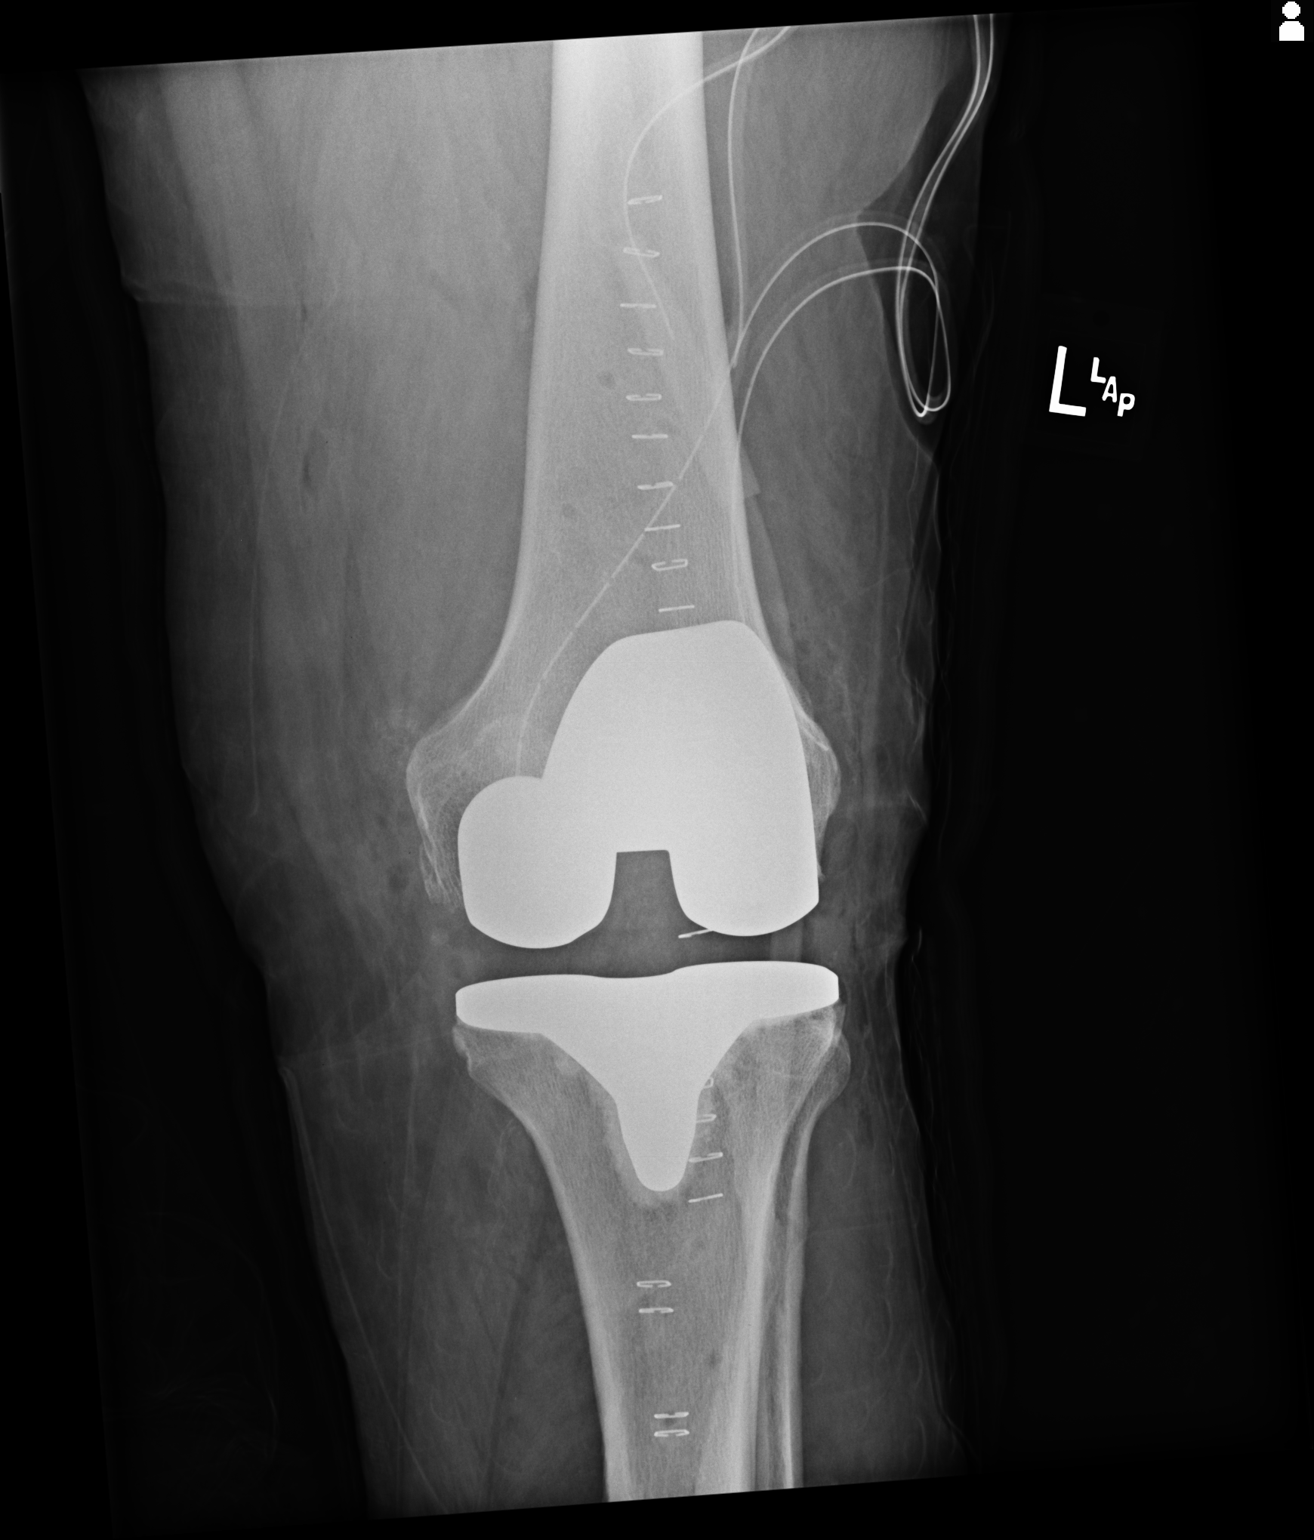
[im 2/2]
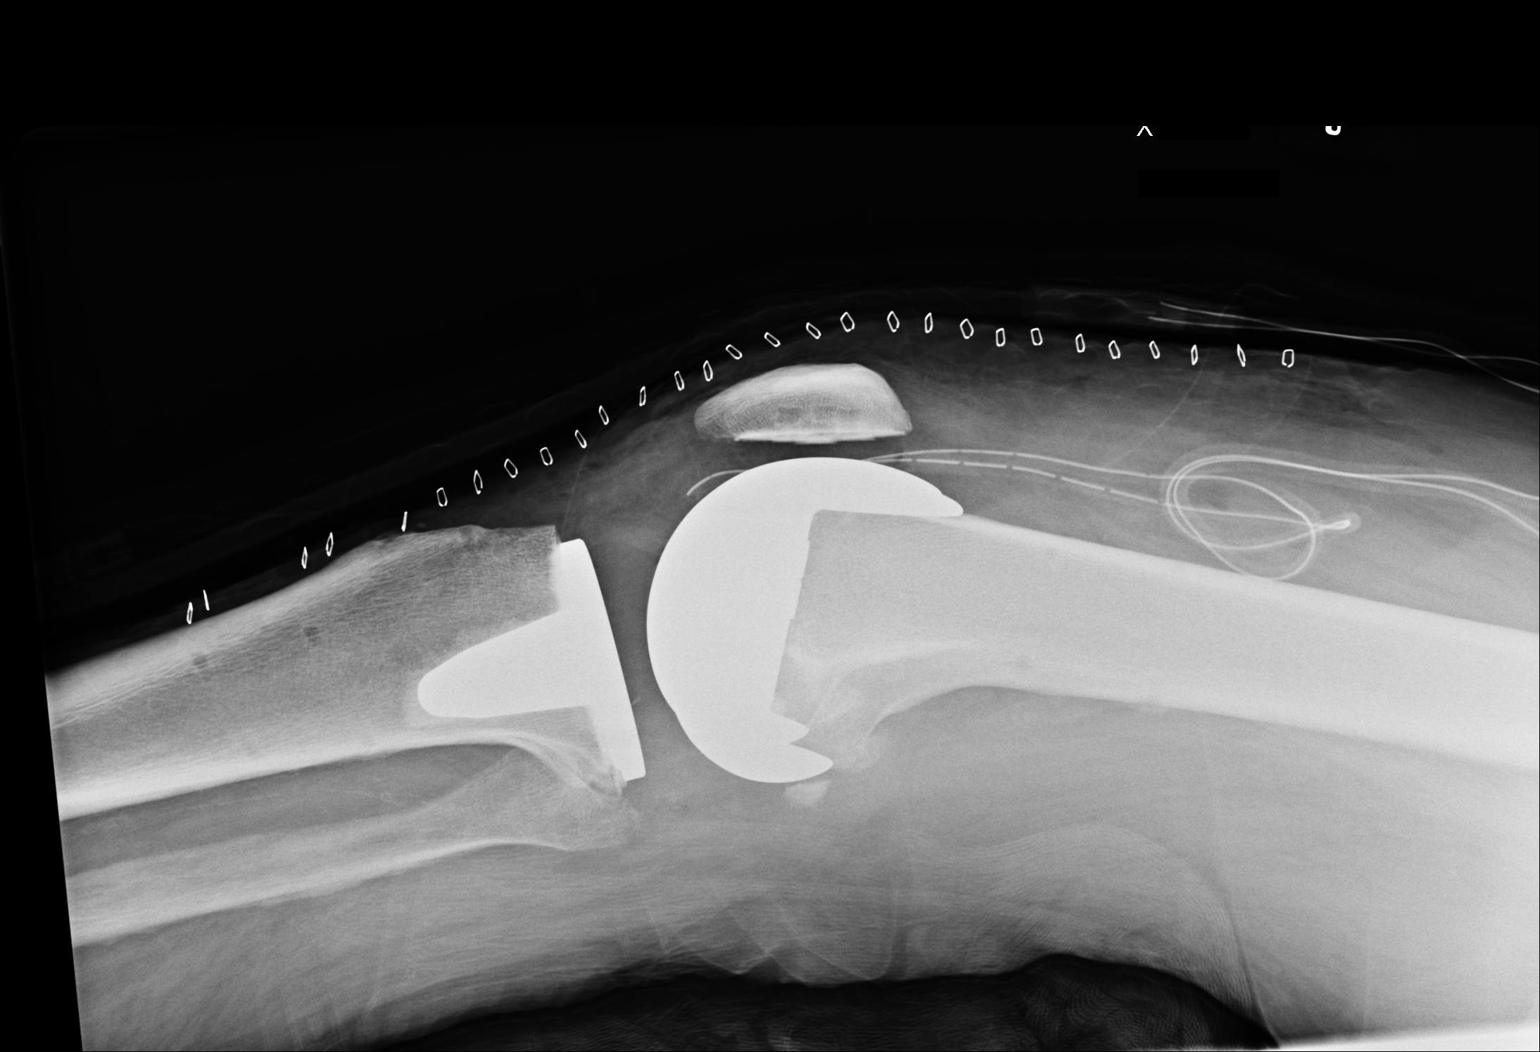

[2 of 2 positions shown; findings below may reference images not displayed]

FINDINGS: Total knee arthroplasty is seen with all 3 components in expected
position. No evidence of fracture, dislocation, or other significant
osseous abnormality.
IMPRESSION: Expected postoperative appearance of total knee arthroplasty. No
evidence of fracture or dislocation.

## 2017-01-21 DIAGNOSIS — Z96652 Presence of left artificial knee joint: Secondary | ICD-10-CM | POA: Diagnosis not present

## 2017-02-03 ENCOUNTER — Ambulatory Visit (INDEPENDENT_AMBULATORY_CARE_PROVIDER_SITE_OTHER): Payer: Medicare Other

## 2017-02-03 ENCOUNTER — Ambulatory Visit (INDEPENDENT_AMBULATORY_CARE_PROVIDER_SITE_OTHER): Payer: Medicare Other | Admitting: Vascular Surgery

## 2017-02-03 ENCOUNTER — Encounter (INDEPENDENT_AMBULATORY_CARE_PROVIDER_SITE_OTHER): Payer: Self-pay | Admitting: Vascular Surgery

## 2017-02-03 VITALS — BP 119/78 | HR 55 | Resp 16 | Wt 298.0 lb

## 2017-02-03 DIAGNOSIS — I872 Venous insufficiency (chronic) (peripheral): Secondary | ICD-10-CM

## 2017-02-03 DIAGNOSIS — M7989 Other specified soft tissue disorders: Secondary | ICD-10-CM | POA: Diagnosis not present

## 2017-02-03 DIAGNOSIS — M79604 Pain in right leg: Secondary | ICD-10-CM

## 2017-02-03 DIAGNOSIS — M79605 Pain in left leg: Secondary | ICD-10-CM

## 2017-02-03 DIAGNOSIS — I890 Lymphedema, not elsewhere classified: Secondary | ICD-10-CM | POA: Diagnosis not present

## 2017-02-04 DIAGNOSIS — I872 Venous insufficiency (chronic) (peripheral): Secondary | ICD-10-CM | POA: Insufficient documentation

## 2017-02-04 DIAGNOSIS — I890 Lymphedema, not elsewhere classified: Secondary | ICD-10-CM | POA: Insufficient documentation

## 2017-02-04 NOTE — Progress Notes (Signed)
Subjective:    Patient ID: Hector Bennett, male    DOB: 14-Apr-1946, 71 y.o.   MRN: [id number] Chief Complaint  Patient presents with  . Follow-up   Patient last seen on 12/03/2016. Patient presents to review vascular studies. Patient last seen for lower extremity edema. Since his last visit, the patient has been engaging in conservative therapy. The patient has been wearing medical grade 1 compression stockings, elevating his legs and remaining active with improved edema and discoloration to his legs. The patient underwent an ABI which was notable for an unreliable bilateral ankle brachial indices most likely a result of medial calcification however the patient has very good waveforms bilaterally. The patient also underwent a right lower extremity venous reflux exam which was notable for a chronic, partially occlusive, deep vein thrombosis of the right femoral vein, popliteal vein and peroneal vein. This is stable. Reflux is noted throughout the deep venous system. copy evidence of the right great saphenous vein. right small saphenous vein with wall thickening.   Review of Systems  Constitutional: Negative.   HENT: Negative.   Eyes: Negative.   Respiratory: Negative.   Cardiovascular: Positive for leg swelling.  Gastrointestinal: Negative.   Endocrine: Negative.   Genitourinary: Negative.   Musculoskeletal: Negative.   Skin: Negative.   Allergic/Immunologic: Negative.   Neurological: Negative.   Hematological: Negative.   Psychiatric/Behavioral: Negative.       Objective:   Physical Exam  Constitutional: He is oriented to person, place, and time. He appears well-developed and well-nourished. No distress.  HENT:  Head: Normocephalic and atraumatic.  Eyes: Conjunctivae are normal. Pupils are equal, round, and reactive to light.  Neck: Normal range of motion.  Cardiovascular: Normal rate, regular rhythm, normal heart sounds and intact distal pulses.   Pulses:      Radial pulses  are 2+ on the right side, and 2+ on the left side.       Dorsalis pedis pulses are 1+ on the right side, and 1+ on the left side.       Posterior tibial pulses are 1+ on the right side, and 1+ on the left side.  Pulmonary/Chest: Effort normal.  Musculoskeletal: Normal range of motion. He exhibits edema (Mild bilateral edema noted).  Neurological: He is alert and oriented to person, place, and time.  Skin: Skin is warm and dry. He is not diaphoretic.  Psychiatric: He has a normal mood and affect. His behavior is normal. Judgment and thought content normal.  Vitals reviewed.  BP 119/78   Pulse (!) 55   Resp 16   Wt 298 lb (135.2 kg)   BMI 42.57 kg/m   Past Medical History:  Diagnosis Date  . A-fib ([place])   . Arthritis   . DVT (deep venous thrombosis) ([place])   . Hyperlipidemia   . Hypertension   . Personal history of thrombophlebitis   . Pulmonary embolism ([place]) 11/26/2007   Bilateral   Social History   Social History  . Marital status: Married    Spouse name: N/A  . Number of children: N/A  . Years of education: N/A   Occupational History  . Not on file.   Social History Main Topics  . Smoking status: Never Smoker  . Smokeless tobacco: Never Used  . Alcohol use 4.2 oz/week    7 Cans of beer per week  . Drug use: No  . Sexual activity: Not on file   Other Topics Concern  . Not on file  Social History Narrative  . No narrative on file   Past Surgical History:  Procedure Laterality Date  . HAND SURGERY Left 11/09/2014  . JOINT REPLACEMENT Left    knee  . KNEE ARTHROPLASTY Left 07/31/2015   Procedure: COMPUTER ASSISTED TOTAL KNEE ARTHROPLASTY;  Surgeon: Dereck Leep, MD;  Location: [facility] ORS;  Service: Orthopedics;  Laterality: Left;  . KNEE SURGERY  1975  . PERIPHERAL VASCULAR CATHETERIZATION N/A 07/25/2015   Procedure: IVC Filter Insertion;  Surgeon: Katha Cabal, MD;  Location: [facility];  Service: Cardiovascular;  Laterality: N/A;  . PERIPHERAL  VASCULAR CATHETERIZATION N/A 08/29/2015   Procedure: IVC Filter Removal;  Surgeon: Katha Cabal, MD;  Location: [facility];  Service: Cardiovascular;  Laterality: N/A;   Family History  Problem Relation Age of Onset  . Hypertension Mother   . Hypertension Brother   . Diabetes Brother   . Dementia Brother    No Known Allergies     Assessment & Plan:  Patient last seen on 12/03/2016. Patient presents to review vascular studies. Patient last seen for lower extremity edema. Since his last visit, the patient has been engaging in conservative therapy. The patient has been wearing medical grade 1 compression stockings, elevating his legs and remaining active with improved edema and discoloration to his legs. The patient underwent an ABI which was notable for an unreliable bilateral ankle brachial indices most likely a result of medial calcification however the patient has very good waveforms bilaterally. The patient also underwent a right lower extremity venous reflux exam which was notable for a chronic, partially occlusive, deep vein thrombosis of the right femoral vein, popliteal vein and peroneal vein. This is stable. Reflux is noted throughout the deep venous system. copy evidence of the right great saphenous vein. right small saphenous vein with wall thickening.  1. Lymphedema - New As below  2. Chronic venous insufficiency - new Patient with chronic venous insufficiency of the right deep venous system. Patient engaging conservative therapy with medical grade 1 compression stockings, elevation and remaining active. This seems to be controlling his swelling. We discussed not being able to manipulate the deep venous system We discussed adding a lymphedema pump to his therapy. The patient is not interested in moving forward with a lymphedema pump at this time as he feels conservative therapy alone is controlling his symptoms. The patient is to follow-up when necessary or if his  symptoms worsen.   Current Outpatient Prescriptions on File Prior to Visit  Medication Sig Dispense Refill  . acetaminophen (RA ACETAMINOPHEN) 650 MG CR tablet Take 650 mg by mouth every 8 (eight) hours as needed.    Marland Kitchen atenolol (TENORMIN) 50 MG tablet Take 1 tablet (50 mg total) by mouth every morning. 90 tablet 4  . benazepril-hydrochlorthiazide (LOTENSIN HCT) 20-25 MG tablet Take 1 tablet by mouth daily. 90 tablet 4  . celecoxib (CELEBREX) 200 MG capsule Take 1 capsule (200 mg total) by mouth daily. 30 capsule 6  . lovastatin (MEVACOR) 20 MG tablet Take 1 tablet (20 mg total) by mouth at bedtime. 90 tablet 4  . rivaroxaban (XARELTO) 20 MG TABS tablet Take 1 tablet (20 mg total) by mouth daily with supper. 90 tablet 4   No current facility-administered medications on file prior to visit.     There are no Patient Instructions on file for this visit. No Follow-up on file.   Georgie Eduardo A Alok Minshall, PA-C

## 2017-03-10 ENCOUNTER — Telehealth: Payer: Self-pay | Admitting: Family Medicine

## 2017-03-10 NOTE — Telephone Encounter (Signed)
Is he just having a tooth pulled? If so- he doesn't need to stop it. It's too late anyway, he'd have to stop it for 24 hours to get it out of his system.

## 2017-03-10 NOTE — Telephone Encounter (Signed)
Routing to providers since patient's dental appointment is today.

## 2017-03-10 NOTE — Telephone Encounter (Signed)
Patient notified

## 2017-05-19 ENCOUNTER — Encounter: Payer: Self-pay | Admitting: Family Medicine

## 2017-05-19 ENCOUNTER — Ambulatory Visit (INDEPENDENT_AMBULATORY_CARE_PROVIDER_SITE_OTHER): Payer: Medicare Other | Admitting: Family Medicine

## 2017-05-19 VITALS — BP 104/65 | HR 71 | Wt 299.0 lb

## 2017-05-19 DIAGNOSIS — E785 Hyperlipidemia, unspecified: Secondary | ICD-10-CM

## 2017-05-19 DIAGNOSIS — I4819 Other persistent atrial fibrillation: Secondary | ICD-10-CM

## 2017-05-19 DIAGNOSIS — I10 Essential (primary) hypertension: Secondary | ICD-10-CM | POA: Diagnosis not present

## 2017-05-19 DIAGNOSIS — I481 Persistent atrial fibrillation: Secondary | ICD-10-CM | POA: Diagnosis not present

## 2017-05-19 LAB — LP+ALT+AST PICCOLO, WAIVED
ALT (SGPT) Piccolo, Waived: 17 U/L (ref 10–47)
AST (SGOT) PICCOLO, WAIVED: 21 U/L (ref 11–38)
Chol/HDL Ratio Piccolo,Waive: 3 mg/dL
Cholesterol Piccolo, Waived: 145 mg/dL (ref <200)
HDL Chol Piccolo, Waived: 49 mg/dL — ABNORMAL LOW (ref >59)
LDL Chol Calc Piccolo Waived: 71 mg/dL (ref <100)
Triglycerides Piccolo,Waived: 123 mg/dL (ref <150)
VLDL Chol Calc Piccolo,Waive: 25 mg/dL (ref <30)

## 2017-05-19 NOTE — Progress Notes (Signed)
BP 104/65   Pulse 71   Wt 299 lb (135.6 kg)   SpO2 97%   BMI 42.71 kg/m    Subjective:    Patient ID: Hector Bennett, male    DOB: 03/30/1946, 71 y.o.   MRN: [id number]  HPI: Hector Bennett is a 71 y.o. male  Chief Complaint  Patient presents with  . Follow-up  . Hyperlipidemia  . Hypertension  Oh doing well on medications except for Xarelto is very expensive but still minute wondering if something is less expensive. Blood pressure good control no issues cholesterol good control no issues taking medications faithfully.  Relevant past medical, surgical, family and social history reviewed and updated as indicated. Interim medical history since our last visit reviewed. Allergies and medications reviewed and updated.  Review of Systems  Constitutional: Negative.   Respiratory: Negative.   Cardiovascular: Negative.     Per HPI unless specifically indicated above     Objective:    BP 104/65   Pulse 71   Wt 299 lb (135.6 kg)   SpO2 97%   BMI 42.71 kg/m   Wt Readings from Last 3 Encounters:  05/19/17 299 lb (135.6 kg)  02/03/17 298 lb (135.2 kg)  12/03/16 297 lb (134.7 kg)    Physical Exam  Constitutional: He is oriented to person, place, and time. He appears well-developed and well-nourished.  HENT:  Head: Normocephalic and atraumatic.  Eyes: Conjunctivae and EOM are normal.  Neck: Normal range of motion.  Cardiovascular: Normal rate, regular rhythm and normal heart sounds.   Pulmonary/Chest: Effort normal and breath sounds normal.  Musculoskeletal: Normal range of motion.  Neurological: He is alert and oriented to person, place, and time.  Skin: No erythema.  Psychiatric: He has a normal mood and affect. His behavior is normal. Judgment and thought content normal.    Results for orders placed or performed in visit on 11/14/16  Microscopic Examination  Result Value Ref Range   WBC, UA None seen 0 - 5 /hpf   RBC, UA 0-2 0 - 2 /hpf   Epithelial Cells  (non renal) CANCELED    Bacteria, UA None seen None seen/Few  CBC with Differential/Platelet  Result Value Ref Range   WBC 8.6 3.4 - 10.8 x10E3/uL   RBC 4.91 4.14 - 5.80 x10E6/uL   Hemoglobin 15.4 13.0 - 17.7 g/dL   Hematocrit 45.5 37.5 - 51.0 %   MCV 93 79 - 97 fL   MCH 31.4 26.6 - 33.0 pg   MCHC 33.8 31.5 - 35.7 g/dL   RDW 13.9 12.3 - 15.4 %   Platelets 248 150 - 379 x10E3/uL   Neutrophils 73 Not Estab. %   Lymphs 17 Not Estab. %   Monocytes 7 Not Estab. %   Eos 3 Not Estab. %   Basos 0 Not Estab. %   Neutrophils Absolute 6.2 1.4 - 7.0 x10E3/uL   Lymphocytes Absolute 1.5 0.7 - 3.1 x10E3/uL   Monocytes Absolute 0.6 0.1 - 0.9 x10E3/uL   EOS (ABSOLUTE) 0.3 0.0 - 0.4 x10E3/uL   Basophils Absolute 0.0 0.0 - 0.2 x10E3/uL   Immature Granulocytes 0 Not Estab. %   Immature Grans (Abs) 0.0 0.0 - 0.1 x10E3/uL  Comprehensive metabolic panel  Result Value Ref Range   Glucose 116 (H) 65 - 99 mg/dL   BUN 14 8 - 27 mg/dL   Creatinine, Ser 1.20 0.76 - 1.27 mg/dL   GFR calc non Af Amer 60 >59 mL/min/1.73   GFR  calc Af Amer 69 >59 mL/min/1.73   BUN/Creatinine Ratio 12 10 - 24   Sodium 139 134 - 144 mmol/L   Potassium 4.0 3.5 - 5.2 mmol/L   Chloride 98 96 - 106 mmol/L   CO2 24 18 - 29 mmol/L   Calcium 9.7 8.6 - 10.2 mg/dL   Total Protein 7.1 6.0 - 8.5 g/dL   Albumin 4.4 3.5 - 4.8 g/dL   Globulin, Total 2.7 1.5 - 4.5 g/dL   Albumin/Globulin Ratio 1.6 1.2 - 2.2   Bilirubin Total 1.0 0.0 - 1.2 mg/dL   Alkaline Phosphatase 68 39 - 117 IU/L   AST 25 0 - 40 IU/L   ALT 17 0 - 44 IU/L  Lipid panel  Result Value Ref Range   Cholesterol, Total 179 100 - 199 mg/dL   Triglycerides 89 0 - 149 mg/dL   HDL 49 >39 mg/dL   VLDL Cholesterol Cal 18 5 - 40 mg/dL   LDL Calculated 112 (H) 0 - 99 mg/dL   Chol/HDL Ratio 3.7 0.0 - 5.0 ratio units  PSA  Result Value Ref Range   Prostate Specific Ag, Serum 0.3 0.0 - 4.0 ng/mL  TSH  Result Value Ref Range   TSH 4.260 0.450 - 4.500 uIU/mL  Urinalysis,  Routine w reflex microscopic  Result Value Ref Range   Specific Gravity, UA 1.015 1.005 - 1.030   pH, UA 6.5 5.0 - 7.5   Color, UA Yellow Yellow   Appearance Ur Clear Clear   Leukocytes, UA Negative Negative   Protein, UA Negative Negative/Trace   Glucose, UA Negative Negative   Ketones, UA Negative Negative   RBC, UA Trace (A) Negative   Bilirubin, UA Negative Negative   Urobilinogen, Ur 0.2 0.2 - 1.0 mg/dL   Nitrite, UA Negative Negative   Microscopic Examination See below:       Assessment & Plan:   Problem List Items Addressed This Visit      Cardiovascular and Mediastinum   Hypertension - Primary    The current medical regimen is effective;  continue present plan and medications.       Relevant Orders   Basic metabolic panel   LP+ALT+AST Piccolo, Waived   A-fib [facility])    Discussed prophylaxis with Xarelto and may consider other medications patient will shop for [place].        Other   Hyperlipidemia    The current medical regimen is effective;  continue present plan and medications.       Relevant Orders   Basic metabolic panel   LP+ALT+AST Pic[place]o, Waived       Follow up plan: Return in about 6 months (around 11/16/2017) for Physical Exam.

## 2017-05-19 NOTE — Assessment & Plan Note (Signed)
The current medical regimen is effective;  continue present plan and medications.  

## 2017-05-19 NOTE — Assessment & Plan Note (Signed)
Discussed prophylaxis with Xarelto and may consider other medications patient will shop for [place].

## 2017-05-20 ENCOUNTER — Encounter: Payer: Self-pay | Admitting: Family Medicine

## 2017-05-20 LAB — BASIC METABOLIC PANEL
BUN/Creatinine Ratio: 13 (ref 10–24)
BUN: 13 mg/dL (ref 8–27)
CHLORIDE: 101 mmol/L (ref 96–106)
CO2: 22 mmol/L (ref 20–29)
CREATININE: 1.01 mg/dL (ref 0.76–1.27)
Calcium: 8.9 mg/dL (ref 8.6–10.2)
GFR, EST AFRICAN AMERICAN: 85 mL/min/{1.73_m2} (ref >59)
GFR, EST NON AFRICAN AMERICAN: 73 mL/min/{1.73_m2} (ref >59)
Glucose: 108 mg/dL — ABNORMAL HIGH (ref 65–99)
Potassium: 3.7 mmol/L (ref 3.5–5.2)
Sodium: 139 mmol/L (ref 134–144)

## 2017-06-18 ENCOUNTER — Other Ambulatory Visit: Payer: Self-pay | Admitting: Family Medicine

## 2017-06-18 DIAGNOSIS — M1990 Unspecified osteoarthritis, unspecified site: Secondary | ICD-10-CM

## 2017-07-27 ENCOUNTER — Encounter: Payer: Self-pay | Admitting: Family Medicine

## 2017-09-04 ENCOUNTER — Telehealth: Payer: Self-pay | Admitting: Family Medicine

## 2017-09-04 DIAGNOSIS — R195 Other fecal abnormalities: Secondary | ICD-10-CM

## 2017-09-04 NOTE — Telephone Encounter (Signed)
Phone call Discussed with patient heme-positive stool on home testing.We will refer to gastroenterology to further evaluate.

## 2017-09-08 ENCOUNTER — Telehealth: Payer: Self-pay

## 2017-09-08 NOTE — Telephone Encounter (Signed)
Gastroenterology Pre-Procedure Review  Request Date:  Requesting Physician: Dr.   PATIENT REVIEW QUESTIONS: The patient responded to the following health history questions as indicated:    1. Are you having any GI issues? No  2. Do you have a personal history of Polyps? No  3. Do you have a family history of Colon Cancer or Polyps? No  4. Diabetes Mellitus? No  5. Joint replacements in the past 12 months? No  6. Major health problems in the past 3 months? No  7. Any artificial heart valves, MVP, or defibrillator? No     MEDICATIONS & ALLERGIES:    Patient reports the following regarding taking any anticoagulation/antiplatelet therapy:   Plavix, Coumadin, Eliquis, Xarelto, Lovenox, Pradaxa, Brilinta, or Effient? Yes, Xarelto for blood clot 2009 Aspirin? No   Patient confirms/reports the following medications:  Current Outpatient Medications  Medication Sig Dispense Refill  . acetaminophen (RA ACETAMINOPHEN) 650 MG CR tablet Take 650 mg by mouth every 8 (eight) hours as needed.    Marland Kitchen atenolol (TENORMIN) 50 MG tablet Take 1 tablet (50 mg total) by mouth every morning. 90 tablet 4  . benazepril-hydrochlorthiazide (LOTENSIN HCT) 20-25 MG tablet Take 1 tablet by mouth daily. 90 tablet 4  . celecoxib (CELEBREX) 200 MG capsule Take 1 capsule (200 mg total) by mouth daily. 30 capsule 0  . lovastatin (MEVACOR) 20 MG tablet Take 1 tablet (20 mg total) by mouth at bedtime. 90 tablet 4  . rivaroxaban (XARELTO) 20 MG TABS tablet Take 1 tablet (20 mg total) by mouth daily with supper. 90 tablet 4   No current facility-administered medications for this visit.     Patient confirms/reports the following allergies:  No Known Allergies  No orders of the defined types were placed in this encounter.   AUTHORIZATION INFORMATION Primary Insurance: 1D#: Group #:  Secondary Insurance: 1D#: Group #:  SCHEDULE INFORMATION: Date: 10/20/17 Time: Location: [facility]

## 2017-09-11 ENCOUNTER — Other Ambulatory Visit: Payer: Self-pay

## 2017-09-11 DIAGNOSIS — R195 Other fecal abnormalities: Secondary | ICD-10-CM

## 2017-09-16 ENCOUNTER — Telehealth: Payer: Self-pay

## 2017-09-16 NOTE — Telephone Encounter (Signed)
Patients wife has been given husbands Xarelto instructions for colonoscopy.  Mr. Radigan wife has been advised to let her husband know to stop Xarelto 2 days prior to colonoscopy and resume 1 day after.  She wrote these instructions down.  Repeated to confirm correct.  Said she will tell her husband.  Thanks [employer]

## 2017-10-20 ENCOUNTER — Ambulatory Visit: Payer: Medicare Other | Admitting: Registered Nurse

## 2017-10-20 ENCOUNTER — Encounter: Payer: Self-pay | Admitting: Anesthesiology

## 2017-10-20 ENCOUNTER — Encounter
Admission: RE | Disposition: A | Discharge status: Home / Self Care | Payer: Self-pay | Source: Ambulatory Visit | Attending: Gastroenterology

## 2017-10-20 ENCOUNTER — Ambulatory Visit
Admission: RE | Admit: 2017-10-20 | Discharge: 2017-10-20 | Disposition: A | Discharge status: Home / Self Care | Payer: Medicare Other | Source: Ambulatory Visit | Attending: Gastroenterology | Admitting: Gastroenterology

## 2017-10-20 DIAGNOSIS — Z79899 Other long term (current) drug therapy: Secondary | ICD-10-CM | POA: Diagnosis not present

## 2017-10-20 DIAGNOSIS — K648 Other hemorrhoids: Secondary | ICD-10-CM | POA: Diagnosis not present

## 2017-10-20 DIAGNOSIS — E785 Hyperlipidemia, unspecified: Secondary | ICD-10-CM | POA: Insufficient documentation

## 2017-10-20 DIAGNOSIS — I10 Essential (primary) hypertension: Secondary | ICD-10-CM | POA: Diagnosis not present

## 2017-10-20 DIAGNOSIS — R195 Other fecal abnormalities: Secondary | ICD-10-CM

## 2017-10-20 DIAGNOSIS — G5600 Carpal tunnel syndrome, unspecified upper limb: Secondary | ICD-10-CM | POA: Insufficient documentation

## 2017-10-20 DIAGNOSIS — I4891 Unspecified atrial fibrillation: Secondary | ICD-10-CM | POA: Insufficient documentation

## 2017-10-20 DIAGNOSIS — Z7901 Long term (current) use of anticoagulants: Secondary | ICD-10-CM | POA: Diagnosis not present

## 2017-10-20 DIAGNOSIS — D122 Benign neoplasm of ascending colon: Secondary | ICD-10-CM

## 2017-10-20 DIAGNOSIS — D124 Benign neoplasm of descending colon: Secondary | ICD-10-CM | POA: Diagnosis not present

## 2017-10-20 DIAGNOSIS — K635 Polyp of colon: Secondary | ICD-10-CM | POA: Diagnosis not present

## 2017-10-20 DIAGNOSIS — Z86711 Personal history of pulmonary embolism: Secondary | ICD-10-CM | POA: Insufficient documentation

## 2017-10-20 HISTORY — PX: COLONOSCOPY WITH PROPOFOL: SHX5780

## 2017-10-20 SURGERY — COLONOSCOPY WITH PROPOFOL
Anesthesia: General

## 2017-10-20 MED ORDER — SODIUM CHLORIDE 0.9 % IV SOLN
INTRAVENOUS | Status: DC
  Administered 2017-10-20: 1000 mL via INTRAVENOUS

## 2017-10-20 MED ORDER — PROPOFOL 500 MG/50ML IV EMUL
INTRAVENOUS | Status: DC | PRN
  Administered 2017-10-20: 140 ug/kg/min via INTRAVENOUS

## 2017-10-20 MED ORDER — SODIUM CHLORIDE 0.9 % IJ SOLN
INTRAMUSCULAR | Status: DC | PRN
  Administered 2017-10-20: 10 mL via INTRAVENOUS

## 2017-10-20 MED ORDER — PROPOFOL 10 MG/ML IV BOLUS
INTRAVENOUS | Status: DC | PRN
  Administered 2017-10-20: 70 mg via INTRAVENOUS
  Administered 2017-10-20: 30 mg via INTRAVENOUS

## 2017-10-20 MED ORDER — PHENYLEPHRINE HCL 10 MG/ML IJ SOLN
INTRAMUSCULAR | Status: DC | PRN
  Administered 2017-10-20: 200 ug via INTRAVENOUS
  Administered 2017-10-20 (×3): 100 ug via INTRAVENOUS
  Administered 2017-10-20 (×2): 200 ug via INTRAVENOUS
  Administered 2017-10-20: 100 ug via INTRAVENOUS

## 2017-10-20 MED ORDER — EPHEDRINE SULFATE 50 MG/ML IJ SOLN
INTRAMUSCULAR | Status: DC | PRN
  Administered 2017-10-20 (×3): 5 mg via INTRAVENOUS
  Administered 2017-10-20: 10 mg via INTRAVENOUS

## 2017-10-20 NOTE — Anesthesia Preprocedure Evaluation (Signed)
Anesthesia Evaluation  Patient identified by MRN, date of birth, ID band Patient awake    Reviewed: Allergy & Precautions, H&P , NPO status , Patient's Chart, lab work & pertinent test results  History of Anesthesia Complications Negative for: history of anesthetic complications  Airway Mallampati: III  TM Distance: <3 FB Neck ROM: limited    Dental  (+) Chipped, Poor Dentition, Missing, Upper Dentures   Pulmonary neg pulmonary ROS, neg shortness of breath,           Cardiovascular Exercise Tolerance: Good hypertension, (-) angina+ Peripheral Vascular Disease  (-) Past MI and (-) DOE      Neuro/Psych negative neurological ROS  negative psych ROS   GI/Hepatic negative GI ROS, Neg liver ROS, neg GERD  ,  Endo/Other  negative endocrine ROS  Renal/GU negative Renal ROS  negative genitourinary   Musculoskeletal  (+) Arthritis ,   Abdominal   Peds  Hematology negative hematology ROS (+)   Anesthesia Other Findings Past Medical History: No date: A-fib ([place]) No date: Arthritis No date: DVT (deep venous thrombosis) ([facility]) No date: Hyperlipidemia No date: Hypertension No date: Personal history of thrombophlebitis 11/26/2007: Pulmonary embolism ([facility])     Comment:  Bilateral  Past Surgical History: 11/09/2014: HAND SURGERY; Left No date: JOINT REPLACEMENT; Left     Comment:  knee 07/31/2015: KNEE ARTHROPLASTY; Left     Comment:  Procedure: COMPUTER ASSISTED TOTAL KNEE ARTHROPLASTY;                Surgeon: Dereck Leep, MD;  Location: [facility];                Service: Orthopedics;  Laterality: Left; 1975: KNEE SURGERY 07/25/2015: PERIPHERAL VASCULAR CATHETERIZATION; N/A     Comment:  Procedure: IVC Filter Insertion;  Surgeon: Katha Cabal, MD;  Location: [place];  Service:               Cardiovascular;  Laterality: N/A; 08/29/2015: PERIPHERAL VASCULAR CATHETERIZATION; N/A     Comment:   Procedure: IVC Filter Removal;  Surgeon: Katha Cabal, MD;  Location: [facility];  Service:               Cardiovascular;  Laterality: N/A;  BMI    Body Mass Index:  40.01 kg/m      Reproductive/Obstetrics negative OB ROS                             Anesthesia Physical Anesthesia Plan  ASA: III  Anesthesia Plan: General   Post-op Pain Management:    Induction: Intravenous  PONV Risk Score and Plan: Propofol infusion and TIVA  Airway Management Planned: Natural Airway and Nasal Cannula  Additional Equipment:   Intra-op Plan:   Post-operative Plan:   Informed Consent: I have reviewed the patients History and Physical, chart, labs and discussed the procedure including the risks, benefits and alternatives for the proposed anesthesia with the patient or authorized representative who has indicated his/her understanding and acceptance.   Dental Advisory Given  Plan Discussed with: Anesthesiologist, CRNA and Surgeon  Anesthesia Plan Comments: (Patient consented for risks of anesthesia including but not limited to:  - adverse reactions to medications - risk of intubation if required - damage to teeth,  lips or other oral mucosa - sore throat or hoarseness - Damage to heart, brain, lungs or loss of life  Patient voiced understanding.)        Anesthesia Quick Evaluation

## 2017-10-20 NOTE — Op Note (Signed)
[facility] Gastroenterology Patient Name: Hector Bennett Procedure Date: 10/20/2017 9:21 AM MRN: [id number] Account #: [id number] Date of Birth: 1945-11-30 Admit Type: Outpatient Age: 72 Room: [facility] ENDO ROOM 4 Gender: Male Note Status: Finalized Procedure:            Colonoscopy Indications:          Heme positive stool Providers:            Jonathon Bellows MD, MD Referring MD:         Guadalupe Maple, MD (Referring MD) Medicines:            Monitored Anesthesia Care Complications:        No immediate complications. Procedure:            Pre-Anesthesia Assessment:                       - Prior to the procedure, a History and Physical was                        performed, and patient medications, allergies and                        sensitivities were reviewed. The patient's tolerance of                        previous anesthesia was reviewed.                       - The risks and benefits of the procedure and the                        sedation options and risks were discussed with the                        patient. All questions were answered and informed                        consent was obtained.                       - ASA Grade Assessment: III - A patient with severe                        systemic disease.                       After obtaining informed consent, the colonoscope was                        passed under direct vision. Throughout the procedure,                        the patient's blood pressure, pulse, and oxygen                        saturations were monitored continuously. The                        Colonoscope was introduced through the anus and  advanced to the the cecum, identified by the                        appendiceal orifice, IC valve and transillumination.                        The colonoscopy was performed with ease. The patient                        tolerated the procedure well. The quality of the bowel                       preparation was adequate. Findings:      The perianal and digital rectal examinations were normal.      Three sessile polyps were found in the ascending colon. The polyps were       5 to 8 mm in size. These polyps were removed with a hot snare. Resection       and retrieval were complete. To prevent bleeding after the polypectomy,       one hemostatic clip was successfully placed. There was no bleeding       during, or at the end, of the procedure.      A 8 mm polyp was found in the descending colon. The polyp was sessile.       The polyp was removed with a cold snare. Resection and retrieval were       complete. To prevent bleeding after the polypectomy, one hemostatic clip       was successfully placed. There was no bleeding at the end of the       procedure.      A 20 mm polyp was found in the descending colon. The polyp was sessile.       Preparations were made for mucosal resection. Eleview 20 cc was injected       to raise the lesion. Piecemeal mucosal resection using a snare was       performed. Resection and retrieval were complete. To close a defect       after mucosal resection, five hemostatic clips were successfully placed.       There was no bleeding at the end of the procedure. Biopsy forceps were       used to take out small fragments of the polyp from the edges      The exam was otherwise without abnormality on direct and retroflexion       views.      The procedure took over 90 minutes in comparison to a normal of 15       minutes due to complex and multiple polyps Impression:           - Three 5 to 8 mm polyps in the ascending colon,                        removed with a hot snare. Resected and retrieved. Clip                        was placed.                       - One 8 mm polyp in the descending colon, removed with  a cold snare. Resected and retrieved. Clip was placed.                       - One 20 mm polyp in the  descending colon, removed with                        mucosal resection. Resected and retrieved. Clips were                        placed.                       - The examination was otherwise normal on direct and                        retroflexion views.                       - Mucosal resection was performed. Resection and                        retrieval were complete. Recommendation:       - Discharge patient to home (with escort).                       - Resume previous diet.                       - Post-Procedure Resumption of Antiplatelet                        Medications: Restart Xarelto tomorrow at prior dose PO.                       - No ibuprofen, naproxen, or other non-steroidal                        anti-inflammatory drugs for 6 weeks after polyp removal. Procedure Code(s):    --- Professional ---                       [phone], 59, Colonoscopy, flexible; with endoscopic                        mucosal resection                       [id number], Colonoscopy, flexible; with removal of tumor(s),                        polyp(s), or other lesion(s) by snare technique Diagnosis Code(s):    --- Professional ---                       D12.2, Benign neoplasm of ascending colon                       D12.4, Benign neoplasm of descending colon                       R19.5, Other fecal abnormalities CPT copyright 2016 American Medical Association. All rights reserved. The codes documented in this report are preliminary and upon coder review may  be  revised to meet current compliance requirements. Jonathon Bellows, MD Jonathon Bellows MD, MD 10/20/2017 11:02:31 AM This report has been signed electronically. Number of Addenda: 0 Note Initiated On: 10/20/2017 9:21 AM Scope Withdrawal Time: 1 hour 24 minutes 13 seconds  Total Procedure Duration: 1 hour 30 minutes 47 seconds       [facility]

## 2017-10-20 NOTE — Transfer of Care (Signed)
Immediate Anesthesia Transfer of Care Note  Patient: Hector Bennett  Procedure(s) Performed: COLONOSCOPY WITH PROPOFOL (N/A )  Patient Location: PACU  Anesthesia Type:General  Level of Consciousness: sedated  Airway & Oxygen Therapy: Patient Spontanous Breathing and Patient connected to nasal cannula oxygen  Post-op Assessment: Report given to RN and Post -op Vital signs reviewed and stable  Post vital signs: Reviewed and stable  Last Vitals:  Vitals:   10/20/17 1100 10/20/17 1103  BP: (!) 95/54 (!) 95/54  Pulse: 82 77  Resp: 19 20  Temp:  (!) 36.1 C  SpO2: 95% 99%    Last Pain:  Vitals:   10/20/17 1103  TempSrc: Tympanic  PainSc:          Complications: No apparent anesthesia complications

## 2017-10-20 NOTE — Anesthesia Postprocedure Evaluation (Signed)
Anesthesia Post Note  Patient: Hector Bennett  Procedure(s) Performed: COLONOSCOPY WITH PROPOFOL (N/A )  Patient location during evaluation: Endoscopy Anesthesia Type: General Level of consciousness: awake and alert Pain management: pain level controlled Vital Signs Assessment: post-procedure vital signs reviewed and stable Respiratory status: spontaneous breathing, nonlabored ventilation, respiratory function stable and patient connected to nasal cannula oxygen Cardiovascular status: blood pressure returned to baseline and stable Postop Assessment: no apparent nausea or vomiting Anesthetic complications: no     Last Vitals:  Vitals:   10/20/17 1103 10/20/17 1110  BP: (!) 95/54 92/73  Pulse: 77 76  Resp: 20 19  Temp: (!) 36.1 C   SpO2: 99% 98%    Last Pain:  Vitals:   10/20/17 1103  TempSrc: Tympanic  PainSc:                  Precious Haws Dracen Reigle

## 2017-10-20 NOTE — H&P (Signed)
Jonathon Bellows, MD [address], [place], [place], [place], [address] [address], [place], [place], [place], [address] Phone: [phone]  Fax: [phone]  Primary Care Physician:  Guadalupe Maple, MD   Pre-Procedure History & Physical: HPI:  Hector Bennett is a 72 y.o. male is here for an colonoscopy.   Past Medical History:  Diagnosis Date  . A-fib ([place])   . Arthritis   . DVT (deep venous thrombosis) ([place])   . Hyperlipidemia   . Hypertension   . Personal history of thrombophlebitis   . Pulmonary embolism ([place]) 11/26/2007   Bilateral    Past Surgical History:  Procedure Laterality Date  . HAND SURGERY Left 11/09/2014  . JOINT REPLACEMENT Left    knee  . KNEE ARTHROPLASTY Left 07/31/2015   Procedure: COMPUTER ASSISTED TOTAL KNEE ARTHROPLASTY;  Surgeon: Dereck Leep, MD;  Location: [facility] ORS;  Service: Orthopedics;  Laterality: Left;  . KNEE SURGERY  1975  . PERIPHERAL VASCULAR CATHETERIZATION N/A 07/25/2015   Procedure: IVC Filter Insertion;  Surgeon: Katha Cabal, MD;  Location: [place] CV LAB;  Service: Cardiovascular;  Laterality: N/A;  . PERIPHERAL VASCULAR CATHETERIZATION N/A 08/29/2015   Procedure: IVC Filter Removal;  Surgeon: Katha Cabal, MD;  Location: [place] CV LAB;  Service: Cardiovascular;  Laterality: N/A;    Prior to Admission medications   Medication Sig Start Date End Date Taking? Authorizing Provider  acetaminophen (RA ACETAMINOPHEN) 650 MG CR tablet Take 650 mg by mouth every 8 (eight) hours as needed.   Yes [provider]  atenolol (TENORMIN) 50 MG tablet Take 1 tablet (50 mg total) by mouth every morning. 11/14/16  Yes Crissman, Jeannette How, MD  benazepril-hydrochlorthiazide (LOTENSIN HCT) 20-25 MG tablet Take 1 tablet by mouth daily. 11/14/16  Yes Crissman, Jeannette How, MD  lovastatin (MEVACOR) 20 MG tablet Take 1 tablet (20 mg total) by mouth at bedtime. 11/14/16  Yes Crissman, Jeannette How, MD  rivaroxaban (XARELTO)  20 MG TABS tablet Take 1 tablet (20 mg total) by mouth daily with supper. 11/14/16  Yes Crissman, Jeannette How, MD  celecoxib (CELEBREX) 200 MG capsule Take 1 capsule (200 mg total) by mouth daily. Patient not taking: Reported on 10/20/2017 06/18/17   Guadalupe Maple, MD    Allergies as of 09/11/2017  . (No Known Allergies)    Family History  Problem Relation Age of Onset  . Hypertension Mother   . Hypertension Brother   . Diabetes Brother   . Dementia Brother     Social History   Socioeconomic History  . Marital status: Married    Spouse name: Not on file  . Number of children: Not on file  . Years of education: Not on file  . Highest education level: Not on file  Social Needs  . Financial resource strain: Not on file  . Food insecurity - worry: Not on file  . Food insecurity - inability: Not on file  . Transportation needs - medical: Not on file  . Transportation needs - non-medical: Not on file  Occupational History  . Not on file  Tobacco Use  . Smoking status: Never Smoker  . Smokeless tobacco: Never Used  Substance and Sexual Activity  . Alcohol use: Yes    Alcohol/week: 4.2 oz    Types: 7 Cans of beer per week  . Drug use: No  . Sexual activity: Not on file  Other Topics Concern  .  Not on file  Social History Narrative  . Not on file    Review of Systems: See HPI, otherwise negative ROS  Physical Exam: BP 126/79   Pulse (!) 57   Temp 98.7 F (37.1 C) (Tympanic)   Resp 16   Ht 6' (1.829 m)   Wt 295 lb (133.8 kg)   SpO2 99%   BMI 40.01 kg/m  General:   Alert,  pleasant and cooperative in NAD Head:  Normocephalic and atraumatic. Neck:  Supple; no masses or thyromegaly. Lungs:  Clear throughout to auscultation, normal respiratory effort.    Heart:  +S1, +S2, Regular rate and rhythm, No edema. Abdomen:  Soft, nontender and nondistended. Normal bowel sounds, without guarding, and without rebound.   Neurologic:  Alert and  oriented x4;  grossly normal  neurologically.  Impression/Plan: Hector Bennett is here for an colonoscopy to be performed for positive stool occult test .   Risks, benefits, limitations, and alternatives regarding  colonoscopy have been reviewed with the patient.  Questions have been answered.  All parties agreeable.   Jonathon Bellows, MD  10/20/2017, 9:17 AM

## 2017-10-20 NOTE — Anesthesia Post-op Follow-up Note (Signed)
Anesthesia QCDR form completed.        

## 2017-10-21 ENCOUNTER — Encounter: Payer: Self-pay | Admitting: Gastroenterology

## 2017-10-21 LAB — SURGICAL PATHOLOGY

## 2017-10-24 ENCOUNTER — Other Ambulatory Visit: Payer: Self-pay

## 2017-10-24 DIAGNOSIS — Z86010 Personal history of colonic polyps: Secondary | ICD-10-CM

## 2017-10-24 MED ORDER — PEG 3350-KCL-NA BICARB-NACL 420 G PO SOLR: 4000.0000 mL | Freq: Once | ORAL | 0 refills | Status: AC

## 2017-10-24 NOTE — Progress Notes (Signed)
Scheduled procedure with spouse. Mailing prep instructions and Rx sent to pharmacy.

## 2017-10-27 ENCOUNTER — Other Ambulatory Visit: Payer: Self-pay

## 2017-10-27 DIAGNOSIS — I4819 Other persistent atrial fibrillation: Secondary | ICD-10-CM

## 2017-10-27 MED ORDER — RIVAROXABAN 20 MG PO TABS: 20.0000 mg | ORAL_TABLET | Freq: Every day | ORAL | 4 refills | Status: DC

## 2017-11-17 ENCOUNTER — Ambulatory Visit (INDEPENDENT_AMBULATORY_CARE_PROVIDER_SITE_OTHER): Payer: Medicare Other

## 2017-11-17 VITALS — BP 109/71 | HR 64 | Temp 97.8°F | Resp 17 | Ht 70.0 in | Wt 297.6 lb

## 2017-11-17 DIAGNOSIS — Z1159 Encounter for screening for other viral diseases: Secondary | ICD-10-CM

## 2017-11-17 DIAGNOSIS — I10 Essential (primary) hypertension: Secondary | ICD-10-CM | POA: Diagnosis not present

## 2017-11-17 DIAGNOSIS — R5383 Other fatigue: Secondary | ICD-10-CM

## 2017-11-17 DIAGNOSIS — N400 Enlarged prostate without lower urinary tract symptoms: Secondary | ICD-10-CM

## 2017-11-17 DIAGNOSIS — Z125 Encounter for screening for malignant neoplasm of prostate: Secondary | ICD-10-CM | POA: Diagnosis not present

## 2017-11-17 DIAGNOSIS — Z0000 Encounter for general adult medical examination without abnormal findings: Secondary | ICD-10-CM | POA: Diagnosis not present

## 2017-11-17 DIAGNOSIS — E785 Hyperlipidemia, unspecified: Secondary | ICD-10-CM

## 2017-11-17 LAB — URINALYSIS, ROUTINE W REFLEX MICROSCOPIC
BILIRUBIN UA: NEGATIVE
GLUCOSE, UA: NEGATIVE
KETONES UA: NEGATIVE
Leukocytes, UA: NEGATIVE
NITRITE UA: NEGATIVE
PROTEIN UA: NEGATIVE
SPEC GRAV UA: 1.015 (ref 1.005–1.030)
UUROB: 1 mg/dL (ref 0.2–1.0)
pH, UA: 6 (ref 5.0–7.5)

## 2017-11-17 LAB — MICROSCOPIC EXAMINATION: BACTERIA UA: NONE SEEN

## 2017-11-17 NOTE — Patient Instructions (Signed)
[name] , Thank you for taking time to come for your Medicare Wellness Visit. I appreciate your ongoing commitment to your health goals. Please review the following plan we discussed and let me know if I can assist you in the future.   Screening recommendations/referrals: Colonoscopy: completed 10/20/2017 Recommended yearly ophthalmology/optometry visit for glaucoma screening and checkup Recommended yearly dental visit for hygiene and checkup  Vaccinations: Influenza vaccine: declined Pneumococcal vaccine: declined booster  Tdap vaccine: up to date  Shingles vaccine: eligible, check with your insurance company for coverage   Advanced directives: Please bring a copy of your health care power of attorney and living will to the office at your convenience.  Conditions/risks identified: Recommend drinking at least 6-8 glasses of water a day   Next appointment: Follow up on 11/18/2017 at 3:00pm with Dr.Crissman. Follow up in one year for your annual wellness exam.   Preventive Care 65 Years and Older, Male Preventive care refers to lifestyle choices and visits with your health care provider that can promote health and wellness. What does preventive care include?  A yearly physical exam. This is also called an annual well check.  Dental exams once or twice a year.  Routine eye exams. Ask your health care provider how often you should have your eyes checked.  Personal lifestyle choices, including:  Daily care of your teeth and gums.  Regular physical activity.  Eating a healthy diet.  Avoiding tobacco and drug use.  Limiting alcohol use.  Practicing safe sex.  Taking low doses of aspirin every day.  Taking vitamin and mineral supplements as recommended by your health care provider. What happens during an annual well check? The services and screenings done by your health care provider during your annual well check will depend on your age, overall health, lifestyle risk  factors, and family history of disease. Counseling  Your health care provider may ask you questions about your:  Alcohol use.  Tobacco use.  Drug use.  Emotional well-being.  Home and relationship well-being.  Sexual activity.  Eating habits.  History of falls.  Memory and ability to understand (cognition).  Work and work Statistician. Screening  You may have the following tests or measurements:  Height, weight, and BMI.  Blood pressure.  Lipid and cholesterol levels. These may be checked every 5 years, or more frequently if you are over 62 years old.  Skin check.  Lung cancer screening. You may have this screening every year starting at age 70 if you have a 30-pack-year history of smoking and currently smoke or have quit within the past 15 years.  Fecal occult blood test (FOBT) of the stool. You may have this test every year starting at age 90.  Flexible sigmoidoscopy or colonoscopy. You may have a sigmoidoscopy every 5 years or a colonoscopy every 10 years starting at age 63.  Prostate cancer screening. Recommendations will vary depending on your family history and other risks.  Hepatitis C blood test.  Hepatitis B blood test.  Sexually transmitted disease (STD) testing.  Diabetes screening. This is done by checking your blood sugar (glucose) after you have not eaten for a while (fasting). You may have this done every 1-3 years.  Abdominal aortic aneurysm (AAA) screening. You may need this if you are a current or former smoker.  Osteoporosis. You may be screened starting at age 56 if you are at high risk. Talk with your health care provider about your test results, treatment options, and if necessary, the need  for more tests. Vaccines  Your health care provider may recommend certain vaccines, such as:  Influenza vaccine. This is recommended every year.  Tetanus, diphtheria, and acellular pertussis (Tdap, Td) vaccine. You may need a Td booster every 10  years.  Zoster vaccine. You may need this after age 31.  Pneumococcal 13-valent conjugate (PCV13) vaccine. One dose is recommended after age 84.  Pneumococcal polysaccharide (PPSV23) vaccine. One dose is recommended after age 53. Talk to your health care provider about which screenings and vaccines you need and how often you need them. This information is not intended to replace advice given to you by your health care provider. Make sure you discuss any questions you have with your health care provider. Document Released: 09/08/2015 Document Revised: 05/01/2016 Document Reviewed: 06/13/2015 Elsevier Interactive Patient Education  2017 [place] Prevention in the Home Falls can cause injuries. They can happen to people of all ages. There are many things you can do to make your home safe and to help prevent falls. What can I do on the outside of my home?  Regularly fix the edges of walkways and driveways and fix any cracks.  Remove anything that might make you trip as you walk through a door, such as a raised step or threshold.  Trim any bushes or trees on the path to your home.  Use bright outdoor lighting.  Clear any walking paths of anything that might make someone trip, such as rocks or tools.  Regularly check to see if handrails are loose or broken. Make sure that both sides of any steps have handrails.  Any raised decks and porches should have guardrails on the edges.  Have any leaves, snow, or ice cleared regularly.  Use sand or salt on walking paths during winter.  Clean up any spills in your garage right away. This includes oil or grease spills. What can I do in the bathroom?  Use night lights.  Install grab bars by the toilet and in the tub and shower. Do not use towel bars as grab bars.  Use non-skid mats or decals in the tub or shower.  If you need to sit down in the shower, use a plastic, non-slip stool.  Keep the floor dry. Clean up any water that  spills on the floor as soon as it happens.  Remove soap buildup in the tub or shower regularly.  Attach bath mats securely with double-sided non-slip rug tape.  Do not have throw rugs and other things on the floor that can make you trip. What can I do in the bedroom?  Use night lights.  Make sure that you have a light by your bed that is easy to reach.  Do not use any sheets or blankets that are too big for your bed. They should not hang down onto the floor.  Have a firm chair that has side arms. You can use this for support while you get dressed.  Do not have throw rugs and other things on the floor that can make you trip. What can I do in the kitchen?  Clean up any spills right away.  Avoid walking on wet floors.  Keep items that you use a lot in easy-to-reach places.  If you need to reach something above you, use a strong step stool that has a grab bar.  Keep electrical cords out of the way.  Do not use floor polish or wax that makes floors slippery. If you must use wax, use  non-skid floor wax.  Do not have throw rugs and other things on the floor that can make you trip. What can I do with my stairs?  Do not leave any items on the stairs.  Make sure that there are handrails on both sides of the stairs and use them. Fix handrails that are broken or loose. Make sure that handrails are as long as the stairways.  Check any carpeting to make sure that it is firmly attached to the stairs. Fix any carpet that is loose or worn.  Avoid having throw rugs at the top or bottom of the stairs. If you do have throw rugs, attach them to the floor with carpet tape.  Make sure that you have a light switch at the top of the stairs and the bottom of the stairs. If you do not have them, ask someone to add them for you. What else can I do to help prevent falls?  Wear shoes that:  Do not have high heels.  Have rubber bottoms.  Are comfortable and fit you well.  Are closed at the  toe. Do not wear sandals.  If you use a stepladder:  Make sure that it is fully opened. Do not climb a closed stepladder.  Make sure that both sides of the stepladder are locked into place.  Ask someone to hold it for you, if possible.  Clearly mark and make sure that you can see:  Any grab bars or handrails.  First and last steps.  Where the edge of each step is.  Use tools that help you move around (mobility aids) if they are needed. These include:  Canes.  Walkers.  Scooters.  Crutches.  Turn on the lights when you go into a dark area. Replace any light bulbs as soon as they burn out.  Set up your furniture so you have a clear path. Avoid moving your furniture around.  If any of your floors are uneven, fix them.  If there are any pets around you, be aware of where they are.  Review your medicines with your doctor. Some medicines can make you feel dizzy. This can increase your chance of falling. Ask your doctor what other things that you can do to help prevent falls. This information is not intended to replace advice given to you by your health care provider. Make sure you discuss any questions you have with your health care provider. Document Released: 06/08/2009 Document Revised: 01/18/2016 Document Reviewed: 09/16/2014 Elsevier Interactive Patient Education  2017 [employer].

## 2017-11-17 NOTE — Progress Notes (Signed)
Subjective:   Hector Bennett is a 72 y.o. male who presents for Medicare Annual/Subsequent preventive examination.  Review of Systems:   Cardiac Risk Factors include: hypertension;male gender;advanced age (>98men, >13 women);diabetes mellitus;obesity (BMI >30kg/m2);dyslipidemia     Objective:    Vitals: BP 109/71 (BP Location: Left Arm, Patient Position: Sitting)   Pulse 64   Temp 97.8 F (36.6 C) (Temporal)   Resp 17   Ht 5\' 10"  (1.778 m)   Wt 297 lb 9.6 oz (135 kg)   BMI 42.70 kg/m   Body mass index is 42.7 kg/m.  Advanced Directives 11/17/2017 10/20/2017 02/03/2017 12/03/2016 07/31/2015 07/31/2015 07/25/2015  Does Patient Have a Medical Advance Directive? Yes Yes No Yes No No No  Type of Paramedic of [place];Living will Living will - Reader;Living will - - -  Copy of [place] in Chart? No - copy requested - - - - - -  Would patient like information on creating a medical advance directive? - - - - No - patient declined information No - patient declined information No - patient declined information    Tobacco Social History   Tobacco Use  Smoking Status Never Smoker  Smokeless Tobacco Never Used     Counseling given: Not Answered   Clinical Intake:  Pre-visit preparation completed: Yes  Pain : No/denies pain     Nutritional Status: BMI > 30  Obese Nutritional Risks: None Diabetes: No  How often do you need to have someone help you when you read instructions, pamphlets, or other written materials from your doctor or pharmacy?: 1 - Never What is the last grade level you completed in school?: 12th grade  Interpreter Needed?: No  Information entered by :: Brycelyn Gambino,LPN   Past Medical History:  Diagnosis Date  . A-fib ([place])   . Arthritis   . DVT (deep venous thrombosis) ([place])   . Hyperlipidemia   . Hypertension   . Personal history of thrombophlebitis   . Pulmonary embolism ([place])  11/26/2007   Bilateral   Past Surgical History:  Procedure Laterality Date  . COLONOSCOPY WITH PROPOFOL N/A 10/20/2017   Procedure: COLONOSCOPY WITH PROPOFOL;  Surgeon: Jonathon Bellows, MD;  Location[facility] ENDOSCOPY;  Service: Gastroenterology;  Laterality: N/A;  . HAND SURGERY Left 11/09/2014  . JOINT REPLACEMENT Left    knee  . KNEE ARTHROPLASTY Left 07/31/2015   Procedure: COMPUTER ASSISTED TOTAL KNEE ARTHROPLASTY;  Surgeon: Dereck Leep, MD;  Location: [facility] ORS;  Service: Orthopedics;  Laterality: Left;  . KNEE SURGERY  1975  . PERIPHERAL VASCULAR CATHETERIZATION N/A 07/25/2015   Procedure: IVC Filter Insertion;  Surgeon: Katha Cabal, MD;  Location: [place] [facility];  Service: Cardiovascular;  Laterality: N/A;  . PERIPHERAL VASCULAR CATHETERIZATION N/A 08/29/2015   Procedure: IVC Filter Removal;  Surgeon: Katha Cabal, MD;  Location: [place] [facility];  Service: Cardiovascular;  Laterality: N/A;   Family History  Problem Relation Age of Onset  . Hypertension Mother   . Hypertension Brother   . Diabetes Brother   . Dementia Brother    Social History   Socioeconomic History  . Marital status: Married    Spouse name: Not on file  . Number of children: Not on file  . Years of education: Not on file  . Highest education level: Not on file  Occupational History  . Not on file  Social Needs  . Financial resource strain: Not hard at all  .  Food insecurity:    Worry: Never true    Inability: Never true  . Transportation needs:    Medical: No    Non-medical: No  Tobacco Use  . Smoking status: Never Smoker  . Smokeless tobacco: Never Used  Substance and Sexual Activity  . Alcohol use: Yes    Alcohol/week: 4.2 oz    Types: 7 Cans of beer per week  . Drug use: No  . Sexual activity: Not on file  Lifestyle  . Physical activity:    Days per week: 0 days    Minutes per session: 0 min  . Stress: Not at all  Relationships  . Social connections:    Talks on phone:  Once a week    Gets together: More than three times a week    Attends religious service: Never    Active member of club or organization: No    Attends meetings of clubs or organizations: Never    Relationship status: Married  Other Topics Concern  . Not on file  Social History Narrative  . Not on file    Outpatient Encounter Medications as of 11/17/2017  Medication Sig  . acetaminophen (RA ACETAMINOPHEN) 650 MG CR tablet Take 650 mg by mouth every 8 (eight) hours as needed.  Marland Kitchen atenolol (TENORMIN) 50 MG tablet Take 1 tablet (50 mg total) by mouth every morning.  . benazepril-hydrochlorthiazide (LOTENSIN HCT) 20-25 MG tablet Take 1 tablet by mouth daily.  Marland Kitchen lovastatin (MEVACOR) 20 MG tablet Take 1 tablet (20 mg total) by mouth at bedtime.  . rivaroxaban (XARELTO) 20 MG TABS tablet Take 1 tablet (20 mg total) by mouth daily with supper.  . [DISCONTINUED] celecoxib (CELEBREX) 200 MG capsule Take 1 capsule (200 mg total) by mouth daily. (Patient not taking: Reported on 11/17/2017)   No facility-administered encounter medications on file as of 11/17/2017.     Activities of Daily Living In your present state of health, do you have any difficulty performing the following activities: 11/17/2017  Hearing? Y  Vision? N  Difficulty concentrating or making decisions? N  Walking or climbing stairs? N  Dressing or bathing? N  Doing errands, shopping? N  Preparing Food and eating ? N  Using the Toilet? N  In the past six months, have you accidently leaked urine? N  Do you have problems with loss of bowel control? N  Managing your Medications? N  Managing your Finances? N  Housekeeping or managing your Housekeeping? N  Some recent data might be hidden    Patient Care Team: Guadalupe Maple, MD as PCP - General (Family Medicine) Mattie Marlin, MD as Referring Physician (Vascular Surgery) Marry Guan Laurice Record, MD (Orthopedic Surgery)   Assessment:   This is a routine wellness examination for  Maor.  Exercise Activities and Dietary recommendations Current Exercise Habits: The patient does not participate in regular exercise at present, Exercise limited by: None identified  Goals    . DIET - INCREASE WATER INTAKE     Recommend drinking at least 6-8 glasses of water a day        Fall Risk Fall Risk  11/17/2017 05/19/2017 11/14/2016 07/11/2015  Falls in the past year? No No No Yes  Number falls in past yr: - - - 1  Injury with Fall? - - - No   Is the patient's home free of loose throw rugs in walkways, pet beds, electrical cords, etc?   yes      Grab bars in the  bathroom? yes      Handrails on the stairs?   no stairs      Adequate lighting?   yes  Timed Get Up and Go Performed: Completed in 8 seconds with no use of assistive devices, steady gait. No intervention needed at this time.   Depression Screen PHQ 2/9 Scores 11/17/2017 05/19/2017 11/14/2016 07/11/2015  PHQ - 2 Score 0 0 0 0    Cognitive Function        Immunization History  Administered Date(s) Administered  . Influenza,inj,Quad PF,6+ Mos 07/11/2015  . Pneumococcal-Unspecified 08/24/2012  . Tdap 05/11/2009    Qualifies for Shingles Vaccine? Yes, discussed shingrix vaccine.   Screening Tests Health Maintenance  Topic Date Due  . INFLUENZA VACCINE  07/20/2018 (Originally 03/26/2017)  . PNA vac Low Risk Adult (2 of 2 - PCV13) 11/18/2018 (Originally 08/24/2013)  . TETANUS/TDAP  05/12/2019  . COLONOSCOPY  10/21/2027  . Hepatitis C Screening  Completed   Cancer Screenings: Lung: Low Dose CT Chest recommended if Age 66-80 years, 30 pack-year currently smoking OR have quit w/in 15years. Patient does not qualify. Colorectal: completed 10/20/2017  Additional Screenings:  Hepatitis B/HIV/Syphillis:not indicated Hepatitis C Screening: done today    Plan:    I have personally reviewed and addressed the Medicare Annual Wellness questionnaire and have noted the following in the patient's chart:  A. Medical  and social history B. Use of alcohol, tobacco or illicit drugs  C. Current medications and supplements D. Functional ability and status E.  Nutritional status F.  Physical activity G. Advance directives H. List of other physicians I.  Hospitalizations, surgeries, and ER visits in previous 12 months J.  [place] such as hearing and vision if needed, cognitive and depression L. Referrals and appointments   In addition, I have reviewed and discussed with patient certain preventive protocols, quality metrics, and best practice recommendations. A written personalized care plan for preventive services as well as general preventive health recommendations were provided to patient.   Signed,  Tyler Aas, LPN Nurse Health Advisor   Nurse Notes: none

## 2017-11-18 ENCOUNTER — Telehealth: Payer: Self-pay

## 2017-11-18 ENCOUNTER — Encounter: Payer: Self-pay | Admitting: Family Medicine

## 2017-11-18 ENCOUNTER — Ambulatory Visit (INDEPENDENT_AMBULATORY_CARE_PROVIDER_SITE_OTHER): Payer: Medicare Other | Admitting: Family Medicine

## 2017-11-18 VITALS — BP 106/68 | HR 68 | Wt 298.0 lb

## 2017-11-18 DIAGNOSIS — E785 Hyperlipidemia, unspecified: Secondary | ICD-10-CM

## 2017-11-18 DIAGNOSIS — Z7189 Other specified counseling: Secondary | ICD-10-CM

## 2017-11-18 DIAGNOSIS — I824Y1 Acute embolism and thrombosis of unspecified deep veins of right proximal lower extremity: Secondary | ICD-10-CM | POA: Diagnosis not present

## 2017-11-18 DIAGNOSIS — I10 Essential (primary) hypertension: Secondary | ICD-10-CM | POA: Diagnosis not present

## 2017-11-18 DIAGNOSIS — I481 Persistent atrial fibrillation: Secondary | ICD-10-CM

## 2017-11-18 DIAGNOSIS — N400 Enlarged prostate without lower urinary tract symptoms: Secondary | ICD-10-CM

## 2017-11-18 DIAGNOSIS — I4819 Other persistent atrial fibrillation: Secondary | ICD-10-CM

## 2017-11-18 LAB — CBC WITH DIFFERENTIAL/PLATELET
BASOS ABS: 0 10*3/uL (ref 0.0–0.2)
Basos: 0 %
EOS (ABSOLUTE): 0.3 10*3/uL (ref 0.0–0.4)
Eos: 4 %
HEMOGLOBIN: 15.1 g/dL (ref 13.0–17.7)
Hematocrit: 45.7 % (ref 37.5–51.0)
IMMATURE GRANS (ABS): 0 10*3/uL (ref 0.0–0.1)
Immature Granulocytes: 0 %
LYMPHS: 17 %
Lymphocytes Absolute: 1.2 10*3/uL (ref 0.7–3.1)
MCH: 31 pg (ref 26.6–33.0)
MCHC: 33 g/dL (ref 31.5–35.7)
MCV: 94 fL (ref 79–97)
MONOCYTES: 5 %
Monocytes Absolute: 0.4 10*3/uL (ref 0.1–0.9)
NEUTROS PCT: 74 %
Neutrophils Absolute: 5.3 10*3/uL (ref 1.4–7.0)
PLATELETS: 232 10*3/uL (ref 150–379)
RBC: 4.87 x10E6/uL (ref 4.14–5.80)
RDW: 14.2 % (ref 12.3–15.4)
WBC: 7.2 10*3/uL (ref 3.4–10.8)

## 2017-11-18 LAB — HEPATITIS C ANTIBODY: HEP C VIRUS AB: 0.2 {s_co_ratio} (ref 0.0–0.9)

## 2017-11-18 LAB — COMPREHENSIVE METABOLIC PANEL
ALBUMIN: 4.4 g/dL (ref 3.5–4.8)
ALK PHOS: 62 IU/L (ref 39–117)
ALT: 17 IU/L (ref 0–44)
AST: 23 IU/L (ref 0–40)
Albumin/Globulin Ratio: 2.1 (ref 1.2–2.2)
BUN / CREAT RATIO: 14 (ref 10–24)
BUN: 15 mg/dL (ref 8–27)
Bilirubin Total: 0.8 mg/dL (ref 0.0–1.2)
CHLORIDE: 101 mmol/L (ref 96–106)
CO2: 21 mmol/L (ref 20–29)
CREATININE: 1.05 mg/dL (ref 0.76–1.27)
Calcium: 9.4 mg/dL (ref 8.6–10.2)
GFR calc non Af Amer: 70 mL/min/{1.73_m2} (ref >59)
GFR, EST AFRICAN AMERICAN: 81 mL/min/{1.73_m2} (ref >59)
GLUCOSE: 131 mg/dL — AB (ref 65–99)
Globulin, Total: 2.1 g/dL (ref 1.5–4.5)
Potassium: 4 mmol/L (ref 3.5–5.2)
Sodium: 141 mmol/L (ref 134–144)
TOTAL PROTEIN: 6.5 g/dL (ref 6.0–8.5)

## 2017-11-18 LAB — LIPID PANEL W/O CHOL/HDL RATIO
CHOLESTEROL TOTAL: 175 mg/dL (ref 100–199)
HDL: 51 mg/dL (ref >39)
LDL CALC: 105 mg/dL — AB (ref 0–99)
Triglycerides: 95 mg/dL (ref 0–149)
VLDL CHOLESTEROL CAL: 19 mg/dL (ref 5–40)

## 2017-11-18 LAB — PSA: Prostate Specific Ag, Serum: 0.4 ng/mL (ref 0.0–4.0)

## 2017-11-18 LAB — TSH: TSH: 3.1 u[IU]/mL (ref 0.450–4.500)

## 2017-11-18 MED ORDER — LOVASTATIN 20 MG PO TABS: 20.0000 mg | ORAL_TABLET | Freq: Every day | ORAL | 4 refills | Status: DC

## 2017-11-18 MED ORDER — BENAZEPRIL-HYDROCHLOROTHIAZIDE 20-25 MG PO TABS: 1.0000 | ORAL_TABLET | Freq: Every day | ORAL | 4 refills | Status: DC

## 2017-11-18 MED ORDER — ATENOLOL 50 MG PO TABS: 50.0000 mg | ORAL_TABLET | ORAL | 4 refills | Status: DC

## 2017-11-18 NOTE — Assessment & Plan Note (Signed)
stable °

## 2017-11-18 NOTE — Progress Notes (Signed)
BP 106/68   Pulse 68   Wt 298 lb (135.2 kg)   SpO2 98%   BMI 42.76 kg/m    Subjective:    Patient ID: Hector Bennett, male    DOB: Sep 12, 1945, 72 y.o.   MRN: [id number]  HPI: Hector Bennett is a 72 y.o. male  Chief Complaint  Patient presents with  . Annual Exam    Saw NHA yesterday.   She is all in all doing well taking Xarelto 20 mg once a day no bleeding bruising issues. Lovastatin for cholesterol without problems blood pressure medicines without problems. Stopped Celebrex without any issues.  Relevant past medical, surgical, family and social history reviewed and updated as indicated. Interim medical history since our last visit reviewed. Allergies and medications reviewed and updated.  Review of Systems  Constitutional: Negative.   HENT: Negative.   Eyes: Negative.   Respiratory: Negative.   Cardiovascular: Negative.   Gastrointestinal: Negative.   Endocrine: Negative.   Genitourinary: Negative.   Musculoskeletal: Negative.   Skin: Negative.   Allergic/Immunologic: Negative.   Neurological: Negative.   Hematological: Negative.   Psychiatric/Behavioral: Negative.     Per HPI unless specifically indicated above     Objective:    BP 106/68   Pulse 68   Wt 298 lb (135.2 kg)   SpO2 98%   BMI 42.76 kg/m   Wt Readings from Last 3 Encounters:  11/18/17 298 lb (135.2 kg)  11/17/17 297 lb 9.6 oz (135 kg)  10/20/17 295 lb (133.8 kg)    Physical Exam  Constitutional: He is oriented to person, place, and time. He appears well-developed and well-nourished.  HENT:  Head: Normocephalic and atraumatic.  Right Ear: External ear normal.  Left Ear: External ear normal.  Eyes: Pupils are equal, round, and reactive to light. Conjunctivae and EOM are normal.  Neck: Normal range of motion. Neck supple.  Cardiovascular: Normal rate, regular rhythm, normal heart sounds and intact distal pulses.  Pulmonary/Chest: Effort normal and breath sounds normal.  Abdominal:  Soft. Bowel sounds are normal. There is no splenomegaly or hepatomegaly.  Genitourinary: Rectum normal and penis normal.  Genitourinary Comments: BPH changes  Musculoskeletal: Normal range of motion.  Neurological: He is alert and oriented to person, place, and time. He has normal reflexes.  Skin: No rash noted. No erythema.  Psychiatric: He has a normal mood and affect. His behavior is normal. Judgment and thought content normal.    Results for orders placed or performed in visit on 11/17/17  Microscopic Examination  Result Value Ref Range   WBC, UA 0-5 0 - 5 /hpf   RBC, UA 0-2 0 - 2 /hpf   Epithelial Cells (non renal) 0-10 0 - 10 /hpf   Bacteria, UA None seen None seen/Few  Hepatitis C antibody screen  Result Value Ref Range   Hep C Virus Ab 0.2 0.0 - 0.9 s/co ratio  CBC with Differential  Result Value Ref Range   WBC 7.2 3.4 - 10.8 x10E3/uL   RBC 4.87 4.14 - 5.80 x10E6/uL   Hemoglobin 15.1 13.0 - 17.7 g/dL   Hematocrit 45.7 37.5 - 51.0 %   MCV 94 79 - 97 fL   MCH 31.0 26.6 - 33.0 pg   MCHC 33.0 31.5 - 35.7 g/dL   RDW 14.2 12.3 - 15.4 %   Platelets 232 150 - 379 x10E3/uL   Neutrophils 74 Not Estab. %   Lymphs 17 Not Estab. %   Monocytes 5 Not  Estab. %   Eos 4 Not Estab. %   Basos 0 Not Estab. %   Neutrophils Absolute 5.3 1.4 - 7.0 x10E3/uL   Lymphocytes Absolute 1.2 0.7 - 3.1 x10E3/uL   Monocytes Absolute 0.4 0.1 - 0.9 x10E3/uL   EOS (ABSOLUTE) 0.3 0.0 - 0.4 x10E3/uL   Basophils Absolute 0.0 0.0 - 0.2 x10E3/uL   Immature Granulocytes 0 Not Estab. %   Immature Grans (Abs) 0.0 0.0 - 0.1 x10E3/uL  Comp Met (CMET)  Result Value Ref Range   Glucose 131 (H) 65 - 99 mg/dL   BUN 15 8 - 27 mg/dL   Creatinine, Ser 1.05 0.76 - 1.27 mg/dL   GFR calc non Af Amer 70 >59 mL/min/1.73   GFR calc Af Amer 81 >59 mL/min/1.73   BUN/Creatinine Ratio 14 10 - 24   Sodium 141 134 - 144 mmol/L   Potassium 4.0 3.5 - 5.2 mmol/L   Chloride 101 96 - 106 mmol/L   CO2 21 20 - 29 mmol/L    Calcium 9.4 8.6 - 10.2 mg/dL   Total Protein 6.5 6.0 - 8.5 g/dL   Albumin 4.4 3.5 - 4.8 g/dL   Globulin, Total 2.1 1.5 - 4.5 g/dL   Albumin/Globulin Ratio 2.1 1.2 - 2.2   Bilirubin Total 0.8 0.0 - 1.2 mg/dL   Alkaline Phosphatase 62 39 - 117 IU/L   AST 23 0 - 40 IU/L   ALT 17 0 - 44 IU/L  Lipid Panel w/o Chol/HDL Ratio  Result Value Ref Range   Cholesterol, Total 175 100 - 199 mg/dL   Triglycerides 95 0 - 149 mg/dL   HDL 51 >39 mg/dL   VLDL Cholesterol Cal 19 5 - 40 mg/dL   LDL Calculated 105 (H) 0 - 99 mg/dL  PSA  Result Value Ref Range   Prostate Specific Ag, Serum 0.4 0.0 - 4.0 ng/mL  TSH  Result Value Ref Range   TSH 3.100 0.450 - 4.500 uIU/mL  Urinalysis, Routine w reflex microscopic  Result Value Ref Range   Specific Gravity, UA 1.015 1.005 - 1.030   pH, UA 6.0 5.0 - 7.5   Color, UA Yellow Yellow   Appearance Ur Clear Clear   Leukocytes, UA Negative Negative   Protein, UA Negative Negative/Trace   Glucose, UA Negative Negative   Ketones, UA Negative Negative   RBC, UA Trace (A) Negative   Bilirubin, UA Negative Negative   Urobilinogen, Ur 1.0 0.2 - 1.0 mg/dL   Nitrite, UA Negative Negative   Microscopic Examination See below:       Assessment & Plan:   Problem List Items Addressed This Visit      Cardiovascular and Mediastinum   Hypertension    The current medical regimen is effective;  continue present plan and medications.       Relevant Medications   lovastatin (MEVACOR) 20 MG tablet   benazepril-hydrochlorthiazide (LOTENSIN HCT) 20-25 MG tablet   atenolol (TENORMIN) 50 MG tablet   A-fib (H[facility])    The current medical regimen is effective;  continue present plan and medications.       Relevant Medications   lovastatin (MEVACOR) 20 MG tablet   benazepril-hydrochlorthiazide (LOTENSIN HCT) 20-25 MG tablet   atenolol (TENORMIN) 50 MG tablet   DVT (deep venous thrombosis) ([facility])    Support hose with no problems      Relevant Medications   lovastatin  (MEVACOR) 20 MG tablet   benazepril-hydrochlorthiazide (LOTENSIN HCT) 20-25 MG tablet   atenolol (TENORMIN) 50  MG tablet     Genitourinary   BPH (benign prostatic hyperplasia)    stable        Other   Hyperlipidemia    The current medical regimen is effective;  continue present plan and medications.       Relevant Medications   lovastatin (MEVACOR) 20 MG tablet   benazepril-hydrochlorthiazide (LOTENSIN HCT) 20-25 MG tablet   atenolol (TENORMIN) 50 MG tablet   Advanced care planning/counseling discussion - Primary    A voluntary discussion about advance care planning including the explanation and discussion of advance directives was extensively discussed  with the patient.  Explanation about the health care proxy and Living will was reviewed and packet with forms with explanation of how to fill them out was given.    Time spent:in encounter 16+ min        Individuals present: Pt           Follow up plan: Return in about 6 months (around 05/21/2018) for BMP,  Lipids, ALT, AST,cbc.

## 2017-11-18 NOTE — Telephone Encounter (Signed)
Mailed patient a copy of medication clearance received from Dr. Jeananne Rama.  Patient to stop Xarelto: 2 days prior, restart: 1 day following.

## 2017-11-18 NOTE — Assessment & Plan Note (Signed)
The current medical regimen is effective;  continue present plan and medications.  

## 2017-11-18 NOTE — Assessment & Plan Note (Signed)
Support hose with no problems

## 2017-11-18 NOTE — Assessment & Plan Note (Signed)
A voluntary discussion about advance care planning including the explanation and discussion of advance directives was extensively discussed  with the patient.  Explanation about the health care proxy and Living will was reviewed and packet with forms with explanation of how to fill them out was given.    Time spent:in encounter 16+ min        Individuals present: Pt

## 2018-01-22 DIAGNOSIS — Z96652 Presence of left artificial knee joint: Secondary | ICD-10-CM | POA: Diagnosis not present

## 2018-03-02 ENCOUNTER — Encounter: Payer: Self-pay | Admitting: *Deleted

## 2018-03-02 ENCOUNTER — Ambulatory Visit: Payer: Medicare Other | Admitting: Certified Registered"

## 2018-03-02 ENCOUNTER — Ambulatory Visit
Admission: RE | Admit: 2018-03-02 | Discharge: 2018-03-02 | Disposition: A | Discharge status: Home / Self Care | Payer: Medicare Other | Source: Ambulatory Visit | Attending: Gastroenterology | Admitting: Gastroenterology

## 2018-03-02 ENCOUNTER — Encounter
Admission: RE | Disposition: A | Discharge status: Home / Self Care | Payer: Self-pay | Source: Ambulatory Visit | Attending: Gastroenterology

## 2018-03-02 DIAGNOSIS — D124 Benign neoplasm of descending colon: Secondary | ICD-10-CM | POA: Diagnosis not present

## 2018-03-02 DIAGNOSIS — Z86711 Personal history of pulmonary embolism: Secondary | ICD-10-CM | POA: Insufficient documentation

## 2018-03-02 DIAGNOSIS — I739 Peripheral vascular disease, unspecified: Secondary | ICD-10-CM | POA: Diagnosis not present

## 2018-03-02 DIAGNOSIS — K635 Polyp of colon: Secondary | ICD-10-CM | POA: Diagnosis not present

## 2018-03-02 DIAGNOSIS — Z79899 Other long term (current) drug therapy: Secondary | ICD-10-CM | POA: Diagnosis not present

## 2018-03-02 DIAGNOSIS — D123 Benign neoplasm of transverse colon: Secondary | ICD-10-CM | POA: Diagnosis not present

## 2018-03-02 DIAGNOSIS — Z86010 Personal history of colonic polyps: Secondary | ICD-10-CM | POA: Insufficient documentation

## 2018-03-02 DIAGNOSIS — Z7901 Long term (current) use of anticoagulants: Secondary | ICD-10-CM | POA: Insufficient documentation

## 2018-03-02 DIAGNOSIS — D122 Benign neoplasm of ascending colon: Secondary | ICD-10-CM | POA: Diagnosis not present

## 2018-03-02 DIAGNOSIS — Z96652 Presence of left artificial knee joint: Secondary | ICD-10-CM | POA: Diagnosis not present

## 2018-03-02 DIAGNOSIS — Z86718 Personal history of other venous thrombosis and embolism: Secondary | ICD-10-CM | POA: Insufficient documentation

## 2018-03-02 DIAGNOSIS — M1990 Unspecified osteoarthritis, unspecified site: Secondary | ICD-10-CM | POA: Diagnosis not present

## 2018-03-02 DIAGNOSIS — Z1211 Encounter for screening for malignant neoplasm of colon: Secondary | ICD-10-CM | POA: Insufficient documentation

## 2018-03-02 DIAGNOSIS — I10 Essential (primary) hypertension: Secondary | ICD-10-CM | POA: Diagnosis not present

## 2018-03-02 HISTORY — PX: COLONOSCOPY WITH PROPOFOL: SHX5780

## 2018-03-02 SURGERY — COLONOSCOPY WITH PROPOFOL
Anesthesia: General

## 2018-03-02 MED ORDER — SODIUM CHLORIDE 0.9 % IV SOLN: INTRAVENOUS | Status: DC

## 2018-03-02 MED ORDER — PROPOFOL 10 MG/ML IV BOLUS
INTRAVENOUS | Status: DC | PRN
  Administered 2018-03-02: 70 mg via INTRAVENOUS
  Administered 2018-03-02: 30 mg via INTRAVENOUS

## 2018-03-02 MED ORDER — PROPOFOL 500 MG/50ML IV EMUL
INTRAVENOUS | Status: DC | PRN
  Administered 2018-03-02: 85 ug/kg/min via INTRAVENOUS

## 2018-03-02 MED ORDER — LIDOCAINE HCL (CARDIAC) PF 100 MG/5ML IV SOSY
PREFILLED_SYRINGE | INTRAVENOUS | Status: DC | PRN
  Administered 2018-03-02: 50 mg via INTRAVENOUS

## 2018-03-02 MED ORDER — PROPOFOL 500 MG/50ML IV EMUL
INTRAVENOUS | Status: AC
  Filled 2018-03-02: qty 50

## 2018-03-02 NOTE — Anesthesia Preprocedure Evaluation (Addendum)
Anesthesia Evaluation  Patient identified by MRN, date of birth, ID band Patient awake    Reviewed: Allergy & Precautions, H&P , NPO status , Patient's Chart, lab work & pertinent test results  History of Anesthesia Complications Negative for: history of anesthetic complications  Airway Mallampati: III  TM Distance: <3 FB Neck ROM: limited    Dental  (+) Chipped, Poor Dentition, Missing, Upper Dentures   Pulmonary neg pulmonary ROS, neg shortness of breath,           Cardiovascular Exercise Tolerance: Good hypertension, (-) angina+ Peripheral Vascular Disease  (-) CAD, (-) Past MI, (-) Cardiac Stents, (-) CABG and (-) DOE (-) dysrhythmias (-) Valvular Problems/Murmurs     Neuro/Psych negative neurological ROS  negative psych ROS   GI/Hepatic negative GI ROS, Neg liver ROS, neg GERD  ,  Endo/Other  negative endocrine ROS  Renal/GU negative Renal ROS  negative genitourinary   Musculoskeletal  (+) Arthritis ,   Abdominal   Peds  Hematology negative hematology ROS (+)   Anesthesia Other Findings Past Medical History: No date: A-fib ([place]) No date: Arthritis No date: DVT (deep venous thrombosis) ([facility]) No date: Hyperlipidemia No date: Hypertension No date: Personal history of thrombophlebitis 11/26/2007: Pulmonary embolism ([facility])     Comment:  Bilateral  Past Surgical History: 11/09/2014: HAND SURGERY; Left No date: JOINT REPLACEMENT; Left     Comment:  knee 07/31/2015: KNEE ARTHROPLASTY; Left     Comment:  Procedure: COMPUTER ASSISTED TOTAL KNEE ARTHROPLASTY;                Surgeon: Dereck Leep, MD;  Location: [facility] ORS;                Service: Orthopedics;  Laterality: Left; 1975: KNEE SURGERY 07/25/2015: PERIPHERAL VASCULAR CATHETERIZATION; N/A     Comment:  Procedure: IVC Filter Insertion;  Surgeon: Katha Cabal, MD;  Location: [place] [facility];  Service:               Cardiovascular;   Laterality: N/A; 08/29/2015: PERIPHERAL VASCULAR CATHETERIZATION; N/A     Comment:  Procedure: IVC Filter Removal;  Surgeon: Katha Cabal, MD;  Location: [place] [facility];  Service:               Cardiovascular;  Laterality: N/A;  BMI    Body Mass Index:  40.01 kg/m      Reproductive/Obstetrics negative OB ROS                            Anesthesia Physical  Anesthesia Plan  ASA: III  Anesthesia Plan: General   Post-op Pain Management:    Induction: Intravenous  PONV Risk Score and Plan: Propofol infusion and TIVA  Airway Management Planned: Natural Airway and Nasal Cannula  Additional Equipment:   Intra-op Plan:   Post-operative Plan:   Informed Consent: I have reviewed the patients History and Physical, chart, labs and discussed the procedure including the risks, benefits and alternatives for the proposed anesthesia with the patient or authorized representative who has indicated his/her understanding and acceptance.   Dental Advisory Given  Plan Discussed with: Anesthesiologist, CRNA and Surgeon  Anesthesia Plan Comments: (Patient consented for risks of anesthesia including but not limited to:  - adverse reactions to  medications - risk of intubation if required - damage to teeth, lips or other oral mucosa - sore throat or hoarseness - Damage to heart, brain, lungs or loss of life  Patient voiced understanding.)        Anesthesia Quick Evaluation

## 2018-03-02 NOTE — Op Note (Signed)
[facility] Gastroenterology Patient Name: Hector Bennett Procedure Date: 03/02/2018 8:41 AM MRN: [id number] Account #: [id number] Date of Birth: 1945-10-30 Admit Type: Outpatient Age: 72 Room: [facility] ENDO ROOM 4 Gender: Male Note Status: Finalized Procedure:            Colonoscopy Indications:          High risk colon cancer surveillance: Personal history                        of colonic polyps, Last colonoscopy: February 2019 Providers:            Jonathon Bellows MD, MD Referring MD:         Guadalupe Maple, MD (Referring MD) Medicines:            Monitored Anesthesia Care Complications:        No immediate complications. Procedure:            Pre-Anesthesia Assessment:                       - Prior to the procedure, a History and Physical was                        performed, and patient medications, allergies and                        sensitivities were reviewed. The patient's tolerance of                        previous anesthesia was reviewed.                       - The risks and benefits of the procedure and the                        sedation options and risks were discussed with the                        patient. All questions were answered and informed                        consent was obtained.                       - ASA Grade Assessment: III - A patient with severe                        systemic disease.                       After obtaining informed consent, the colonoscope was                        passed under direct vision. Throughout the procedure,                        the patient's blood pressure, pulse, and oxygen                        saturations were monitored continuously. The  Colonoscope was introduced through the anus and                        advanced to the the cecum, identified by the                        appendiceal orifice, IC valve and transillumination.                        The colonoscopy was  performed without difficulty. The                        patient tolerated the procedure well. The quality of                        the bowel preparation was poor. Findings:      The perianal and digital rectal examinations were normal.      A 5 mm polyp was found in the proximal descending colon. The polyp was       sessile. this is the locagtion of prior polypecytomy site Biopsies were       taken with a cold forceps for histology.      Two sessile polyps were found in the ascending colon. The polyps were 4       to 6 mm in size. These polyps were removed with a cold snare. Resection       and retrieval were complete. To prevent bleeding after the polypectomy,       one hemostatic clip was successfully placed. There was no bleeding at       the end of the procedure.      A 3 mm polyp was found in the ascending colon. The polyp was sessile.       The polyp was removed with a cold biopsy forceps. Resection and       retrieval were complete.      A 4 mm polyp was found in the descending colon. The polyp was sessile.       The polyp was removed with a cold snare. Resection and retrieval were       complete.      The exam was otherwise without abnormality on direct and retroflexion       views. Impression:           - Preparation of the colon was poor.                       - One 5 mm polyp in the proximal descending colon.                        Biopsied.                       - Two 4 to 6 mm polyps in the ascending colon, removed                        with a cold snare. Resected and retrieved.                       - One 3 mm polyp in the ascending colon, removed with a  cold biopsy forceps. Resected and retrieved.                       - One 4 mm polyp in the descending colon, removed with                        a cold snare. Resected and retrieved.                       - The examination was otherwise normal on direct and                        retroflexion  views. Recommendation:       - Discharge patient to home (with escort).                       - Resume previous diet.                       - Continue present medications.                       - Resume Xarelto (rivaroxaban) at prior dose today.                       - Await pathology results.                       - Repeat colonoscopy in 6 months because the bowel                        preparation was suboptimal. Procedure Code(s):    --- Professional ---                       [id number], Colonoscopy, flexible; with removal of tumor(s),                        polyp(s), or Hector lesion(s) by snare technique                       45380, 32, Colonoscopy, flexible; with biopsy, single                        or multiple Diagnosis Code(s):    --- Professional ---                       Z86.010, Personal history of colonic polyps                       D12.2, Benign neoplasm of ascending colon                       D12.4, Benign neoplasm of descending colon CPT copyright 2017 American Medical Association. All rights reserved. The codes documented in this report are preliminary and upon coder review may  be revised to meet current compliance requirements. Jonathon Bellows, MD Jonathon Bellows MD, MD 03/02/2018 9:18:32 AM This report has been signed electronically. Number of Addenda: 0 Note Initiated On: 03/02/2018 8:41 AM Scope Withdrawal Time: 0 hours 15 minutes 7 seconds  Total Procedure Duration: 0 hours 21 minutes 37 seconds       [place]  [facility]

## 2018-03-02 NOTE — Anesthesia Postprocedure Evaluation (Signed)
Anesthesia Post Note  Patient: Hector Bennett  Procedure(s) Performed: COLONOSCOPY WITH PROPOFOL (N/A )  Patient location during evaluation: Endoscopy Anesthesia Type: General Level of consciousness: awake and alert Pain management: pain level controlled Vital Signs Assessment: post-procedure vital signs reviewed and stable Respiratory status: spontaneous breathing, nonlabored ventilation, respiratory function stable and patient connected to nasal cannula oxygen Cardiovascular status: blood pressure returned to baseline and stable Postop Assessment: no apparent nausea or vomiting Anesthetic complications: no     Last Vitals:  Vitals:   03/02/18 0939 03/02/18 0949  BP: 109/68 (!) 117/57  Pulse: (!) 56 (!) 52  Resp: 20 (!) 22  Temp:    SpO2: 98% 99%    Last Pain:  Vitals:   03/02/18 0949  TempSrc:   PainSc: 0-No pain                 Martha Clan

## 2018-03-02 NOTE — H&P (Signed)
Hector Bellows, MD [address], [place], [place], [place], [address] [address], [place], [place], [place], [address] Phone: [phone]  Fax: [phone]  Primary Care Physician:  Guadalupe Maple, MD   Pre-Procedure History & Physical: HPI:  Hector Bennett is a 72 y.o. male is here for an colonoscopy.   Past Medical History:  Diagnosis Date  . A-fib ([place])   . Arthritis   . DVT (deep venous thrombosis) ([place])   . Hyperlipidemia   . Hypertension   . Personal history of thrombophlebitis   . Pulmonary embolism ([place]) 11/26/2007   Bilateral    Past Surgical History:  Procedure Laterality Date  . COLONOSCOPY WITH PROPOFOL N/A 10/20/2017   Procedure: COLONOSCOPY WITH PROPOFOL;  Surgeon: Hector Bellows, MD;  Location: [facility] ENDOSCOPY;  Service: Gastroenterology;  Laterality: N/A;  . HAND SURGERY Left 11/09/2014  . JOINT REPLACEMENT Left    knee  . KNEE ARTHROPLASTY Left 07/31/2015   Procedure: COMPUTER ASSISTED TOTAL KNEE ARTHROPLASTY;  Surgeon: Dereck Leep, MD;  Location: [facility] ORS;  Service: Orthopedics;  Laterality: Left;  . KNEE SURGERY  1975  . PERIPHERAL VASCULAR CATHETERIZATION N/A 07/25/2015   Procedure: IVC Filter Insertion;  Surgeon: Katha Cabal, MD;  Location: [place] CV LAB;  Service: Cardiovascular;  Laterality: N/A;  . PERIPHERAL VASCULAR CATHETERIZATION N/A 08/29/2015   Procedure: IVC Filter Removal;  Surgeon: Katha Cabal, MD;  Location: [place] CV LAB;  Service: Cardiovascular;  Laterality: N/A;    Prior to Admission medications   Medication Sig Start Date End Date Taking? Authorizing Provider  acetaminophen (RA ACETAMINOPHEN) 650 MG CR tablet Take 650 mg by mouth every 8 (eight) hours as needed.   Yes [provider]  atenolol (TENORMIN) 50 MG tablet Take 1 tablet (50 mg total) by mouth every morning. 11/18/17  Yes Crissman, Jeannette How, MD  benazepril-hydrochlorthiazide (LOTENSIN HCT) 20-25 MG tablet Take 1 tablet by mouth daily. 11/18/17   Yes Crissman, Jeannette How, MD  lovastatin (MEVACOR) 20 MG tablet Take 1 tablet (20 mg total) by mouth at bedtime. 11/18/17  Yes Crissman, Jeannette How, MD  rivaroxaban (XARELTO) 20 MG TABS tablet Take 1 tablet (20 mg total) by mouth daily with supper. 10/27/17  Yes Kathrine Haddock, NP    Allergies as of 10/24/2017  . (No Known Allergies)    Family History  Problem Relation Age of Onset  . Hypertension Mother   . Hypertension Brother   . Diabetes Brother   . Dementia Brother     Social History   Socioeconomic History  . Marital status: Married    Spouse name: Not on file  . Number of children: Not on file  . Years of education: Not on file  . Highest education level: Not on file  Occupational History  . Not on file  Social Needs  . Financial resource strain: Not hard at all  . Food insecurity:    Worry: Never true    Inability: Never true  . Transportation needs:    Medical: No    Non-medical: No  Tobacco Use  . Smoking status: Never Smoker  . Smokeless tobacco: Never Used  Substance and Sexual Activity  . Alcohol use: Yes    Alcohol/week: 4.2 oz    Types: 7 Cans of beer per week  . Drug use: No  . Sexual activity: Not on file  Lifestyle  . Physical activity:    Days per week: 0 days    Minutes per  session: 0 min  . Stress: Not at all  Relationships  . Social connections:    Talks on phone: Once a week    Gets together: More than three times a week    Attends religious service: Never    Active member of club or organization: No    Attends meetings of clubs or organizations: Never    Relationship status: Married  . Intimate partner violence:    Fear of current or ex partner: No    Emotionally abused: No    Physically abused: No    Forced sexual activity: No  Other Topics Concern  . Not on file  Social History Narrative  . Not on file    Review of Systems: See HPI, otherwise negative ROS  Physical Exam: BP (!) 149/72   Pulse 72   Temp (!) 97.5 F (36.4 C)  (Tympanic)   Resp 20   Ht 6' (1.829 m)   Wt 298 lb (135.2 kg)   SpO2 99%   BMI 40.42 kg/m  General:   Alert,  pleasant and cooperative in NAD Head:  Normocephalic and atraumatic. Neck:  Supple; no masses or thyromegaly. Lungs:  Clear throughout to auscultation, normal respiratory effort.    Heart:  +S1, +S2, Regular rate and rhythm, No edema. Abdomen:  Soft, nontender and nondistended. Normal bowel sounds, without guarding, and without rebound.   Neurologic:  Alert and  oriented x4;  grossly normal neurologically.  Impression/Plan: Hector Bennett is here for an colonoscopy to be performed for surveillance due to prior history of colon polyps   Risks, benefits, limitations, and alternatives regarding  colonoscopy have been reviewed with the patient.  Questions have been answered.  All parties agreeable.   Hector Bellows, MD  03/02/2018, 8:40 AM

## 2018-03-02 NOTE — Anesthesia Procedure Notes (Signed)
Performed by: Yue Glasheen, CRNA Pre-anesthesia Checklist: Patient identified, Emergency Drugs available, Suction available, Patient being monitored and Timeout performed Patient Re-evaluated:Patient Re-evaluated prior to induction Oxygen Delivery Method: Nasal cannula Induction Type: IV induction       

## 2018-03-02 NOTE — Anesthesia Post-op Follow-up Note (Signed)
Anesthesia QCDR form completed.        

## 2018-03-02 NOTE — Transfer of Care (Signed)
Immediate Anesthesia Transfer of Care Note  Patient: Hector Bennett  Procedure(s) Performed: COLONOSCOPY WITH PROPOFOL (N/A )  Patient Location: PACU  Anesthesia Type:General  Level of Consciousness: sedated and responds to stimulation  Airway & Oxygen Therapy: Patient Spontanous Breathing and Patient connected to nasal cannula oxygen  Post-op Assessment: Report given to RN and Post -op Vital signs reviewed and stable  Post vital signs: Reviewed and stable  Last Vitals:  Vitals Value Taken Time  BP 121/83 03/02/2018  9:19 AM  Temp    Pulse 69 03/02/2018  9:19 AM  Resp 21 03/02/2018  9:19 AM  SpO2 99 % 03/02/2018  9:19 AM  Vitals shown include unvalidated device data.  Last Pain:  Vitals:   03/02/18 0744  TempSrc: Tympanic  PainSc: 8       Patients Stated Pain Goal: 0 (06/81/66 1969)  Complications: No apparent anesthesia complications

## 2018-03-03 ENCOUNTER — Encounter: Payer: Self-pay | Admitting: Gastroenterology

## 2018-03-03 LAB — SURGICAL PATHOLOGY

## 2018-03-12 ENCOUNTER — Telehealth: Payer: Self-pay

## 2018-03-12 NOTE — Telephone Encounter (Signed)
-----   Message from Jonathon Bellows, MD sent at 03/08/2018  2:16 PM EDT ----- Gaddiel Cullens inform - muliple adenomas, poor prep , repeat colonoscopy in 6 months

## 2018-03-12 NOTE — Telephone Encounter (Signed)
Tried contacting pt regarding results but vm was not available.

## 2018-03-16 NOTE — Telephone Encounter (Signed)
-----   Message from Jonathon Bellows, MD sent at 03/08/2018  2:16 PM EDT ----- Lee Kalt inform - muliple adenomas, poor prep , repeat colonoscopy in 6 months

## 2018-03-16 NOTE — Telephone Encounter (Signed)
Pt notified of results. Added to 6 month recall list.

## 2018-03-23 DIAGNOSIS — M25561 Pain in right knee: Secondary | ICD-10-CM | POA: Diagnosis not present

## 2018-03-23 DIAGNOSIS — M1711 Unilateral primary osteoarthritis, right knee: Secondary | ICD-10-CM | POA: Diagnosis not present

## 2018-03-31 DIAGNOSIS — M1731 Unilateral post-traumatic osteoarthritis, right knee: Secondary | ICD-10-CM | POA: Diagnosis not present

## 2018-03-31 DIAGNOSIS — M25561 Pain in right knee: Secondary | ICD-10-CM | POA: Diagnosis not present

## 2018-05-28 ENCOUNTER — Ambulatory Visit (INDEPENDENT_AMBULATORY_CARE_PROVIDER_SITE_OTHER): Payer: Medicare Other | Admitting: Family Medicine

## 2018-05-28 ENCOUNTER — Encounter: Payer: Self-pay | Admitting: Family Medicine

## 2018-05-28 VITALS — BP 134/78 | HR 57 | Wt 298.2 lb

## 2018-05-28 DIAGNOSIS — I10 Essential (primary) hypertension: Secondary | ICD-10-CM | POA: Diagnosis not present

## 2018-05-28 DIAGNOSIS — I824Y1 Acute embolism and thrombosis of unspecified deep veins of right proximal lower extremity: Secondary | ICD-10-CM

## 2018-05-28 DIAGNOSIS — E785 Hyperlipidemia, unspecified: Secondary | ICD-10-CM | POA: Diagnosis not present

## 2018-05-28 LAB — LP+ALT+AST PICCOLO, WAIVED
ALT (SGPT) Piccolo, Waived: 28 U/L (ref 10–47)
AST (SGOT) Piccolo, Waived: 21 U/L (ref 11–38)
CHOL/HDL RATIO PICCOLO,WAIVE: 3.1 mg/dL
CHOLESTEROL PICCOLO, WAIVED: 167 mg/dL (ref <200)
HDL Chol Piccolo, Waived: 54 mg/dL — ABNORMAL LOW (ref >59)
LDL CHOL CALC PICCOLO WAIVED: 91 mg/dL (ref <100)
Triglycerides Piccolo,Waived: 113 mg/dL (ref <150)
VLDL CHOL CALC PICCOLO,WAIVE: 23 mg/dL (ref <30)

## 2018-05-28 LAB — CBC WITH DIFFERENTIAL/PLATELET
HEMATOCRIT: 48.6 % (ref 37.5–51.0)
HEMOGLOBIN: 16.6 g/dL (ref 13.0–17.7)
LYMPHS ABS: 1.5 10*3/uL (ref 0.7–3.1)
LYMPHS: 18 %
MCH: 32.2 pg (ref 26.6–33.0)
MCHC: 34.2 g/dL (ref 31.5–35.7)
MCV: 94 fL (ref 79–97)
MID (Absolute): 0.7 10*3/uL (ref 0.1–1.6)
MID: 8 %
Neutrophils Absolute: 5.9 10*3/uL (ref 1.4–7.0)
Neutrophils: 74 %
Platelets: 201 10*3/uL (ref 150–450)
RBC: 5.15 x10E6/uL (ref 4.14–5.80)
RDW: 15.1 % (ref 12.3–15.4)
WBC: 8.1 10*3/uL (ref 3.4–10.8)

## 2018-05-28 NOTE — Assessment & Plan Note (Signed)
The current medical regimen is effective;  continue present plan and medications.  

## 2018-05-28 NOTE — Progress Notes (Signed)
   BP 134/78 (BP Location: Left Arm, Patient Position: Sitting, Cuff Size: Normal)   Pulse (!) 57   Wt 298 lb 4 oz (135.3 kg)   SpO2 95%   BMI 40.45 kg/m    Subjective:    Patient ID: Hector Bennett, male    DOB: Apr 19, 1946, 72 y.o.   MRN: [id number]  HPI: JAHBARI REPINSKI is a 72 y.o. male  Chief Complaint  Patient presents with  . Hypertension  . Hyperlipidemia  Patient all in all doing well no bleeding bruising issues taking blood thinner without problems.  Wearing support hose no A. fib issues or DVT problems. Blood pressure good control cholesterol good control no issues or concerns with medications.   Relevant past medical, surgical, family and social history reviewed and updated as indicated. Interim medical history since our last visit reviewed. Allergies and medications reviewed and updated.  Review of Systems  Constitutional: Negative.   Respiratory: Negative.   Cardiovascular: Negative.     Per HPI unless specifically indicated above     Objective:    BP 134/78 (BP Location: Left Arm, Patient Position: Sitting, Cuff Size: Normal)   Pulse (!) 57   Wt 298 lb 4 oz (135.3 kg)   SpO2 95%   BMI 40.45 kg/m   Wt Readings from Last 3 Encounters:  05/28/18 298 lb 4 oz (135.3 kg)  03/02/18 298 lb (135.2 kg)  11/18/17 298 lb (135.2 kg)    Physical Exam  Constitutional: He is oriented to person, place, and time. He appears well-developed and well-nourished.  HENT:  Head: Normocephalic and atraumatic.  Eyes: Conjunctivae and EOM are normal.  Neck: Normal range of motion.  Cardiovascular: Normal rate, regular rhythm and normal heart sounds.  Pulmonary/Chest: Effort normal and breath sounds normal.  Musculoskeletal: Normal range of motion.  Neurological: He is alert and oriented to person, place, and time.  Skin: No erythema.  Psychiatric: He has a normal mood and affect. His behavior is normal. Judgment and thought content normal.        Assessment & Plan:     Problem List Items Addressed This Visit      Cardiovascular and Mediastinum   Hypertension - Primary    The current medical regimen is effective;  continue present plan and medications.       Relevant Orders   Basic metabolic panel   CBC With Differential/Platelet   DVT (deep venous thrombosis) (HCC)    The current medical regimen is effective;  continue present plan and medications.         Other   Hyperlipidemia    The current medical regimen is effective;  continue present plan and medications.       Relevant Orders   LP+ALT+AST [place], Waived       Follow up plan: Return in about 6 months (around 11/27/2018) for Physical Exam.

## 2018-05-29 LAB — BASIC METABOLIC PANEL
BUN/Creatinine Ratio: 12 (ref 10–24)
BUN: 12 mg/dL (ref 8–27)
CALCIUM: 9.7 mg/dL (ref 8.6–10.2)
CO2: 25 mmol/L (ref 20–29)
CREATININE: 0.98 mg/dL (ref 0.76–1.27)
Chloride: 103 mmol/L (ref 96–106)
GFR calc Af Amer: 87 mL/min/{1.73_m2} (ref >59)
GFR, EST NON AFRICAN AMERICAN: 76 mL/min/{1.73_m2} (ref >59)
Glucose: 87 mg/dL (ref 65–99)
Potassium: 4.8 mmol/L (ref 3.5–5.2)
Sodium: 141 mmol/L (ref 134–144)

## 2018-07-16 DIAGNOSIS — M1711 Unilateral primary osteoarthritis, right knee: Secondary | ICD-10-CM | POA: Diagnosis not present

## 2018-08-03 ENCOUNTER — Telehealth: Payer: Self-pay | Admitting: Family Medicine

## 2018-08-03 NOTE — Telephone Encounter (Signed)
[name], I spoke with [name] regarding rescheduling [name] and she stated that he will be having knee surgery on 10/12/18 and wanted to know if he needed to come in for a pre-op appt with Dr. Jeananne Rama (she believes he did that before his last surgery) He does have an appt with Dr. Lucky Cowboy on 12/31. Will you please call her to discuss?  Thanks, KB

## 2018-08-03 NOTE — Telephone Encounter (Signed)
Attempted to reach patient, VM box has not been set up. If Dr. Lucky Cowboy needs surgical clearance he will let patient know at that 12/31 appointment and then patient can call to schedule that with Dr. Jeananne Rama. I do not see where Dr. Jeananne Rama did a surgical clearance on patient at last OV.

## 2018-08-25 ENCOUNTER — Encounter (INDEPENDENT_AMBULATORY_CARE_PROVIDER_SITE_OTHER): Payer: Self-pay | Admitting: Vascular Surgery

## 2018-08-25 ENCOUNTER — Other Ambulatory Visit: Payer: Self-pay

## 2018-08-25 ENCOUNTER — Ambulatory Visit (INDEPENDENT_AMBULATORY_CARE_PROVIDER_SITE_OTHER): Payer: Medicare Other | Admitting: Vascular Surgery

## 2018-08-25 VITALS — BP 126/77 | HR 52 | Resp 16 | Ht 72.0 in | Wt 307.0 lb

## 2018-08-25 DIAGNOSIS — I4891 Unspecified atrial fibrillation: Secondary | ICD-10-CM | POA: Diagnosis not present

## 2018-08-25 DIAGNOSIS — E785 Hyperlipidemia, unspecified: Secondary | ICD-10-CM | POA: Diagnosis not present

## 2018-08-25 DIAGNOSIS — I10 Essential (primary) hypertension: Secondary | ICD-10-CM | POA: Diagnosis not present

## 2018-08-25 DIAGNOSIS — I825Y1 Chronic embolism and thrombosis of unspecified deep veins of right proximal lower extremity: Secondary | ICD-10-CM | POA: Diagnosis not present

## 2018-08-25 DIAGNOSIS — M1990 Unspecified osteoarthritis, unspecified site: Secondary | ICD-10-CM

## 2018-08-25 NOTE — Assessment & Plan Note (Signed)
lipid control important in reducing the progression of atherosclerotic disease.  Not currently taking a statin

## 2018-08-25 NOTE — Progress Notes (Signed)
Patient ID: Hector Bennett, male   DOB: 08-09-1946, 72 y.o.   MRN: [id number]  Chief Complaint  Patient presents with  . Consult    HPI Hector Bennett is a 72 y.o. male.  Patient presents on referral from Dr. Marry Guan for consideration for IVC filter placement.  The patient is scheduled for a total knee replacement in mid February.  He has a previous history of DVT and pulmonary embolus.  Prior to his left knee replacement, he had an IVC filter placed that was subsequently removed following surgery.  He did well with this and had no complications around the time of his knee surgery.  His mobility is quite poor due to his severe arthritis currently.  He is overweight.  He does remain on anticoagulation currently and has been on this for some time.  No current chest Bennett or shortness of breath.  No current fever or chills.  Current Outpatient Medications  Medication Sig Dispense Refill  . acetaminophen (RA ACETAMINOPHEN) 650 MG CR tablet Take 650 mg by mouth every 8 (eight) hours as needed.    Marland Kitchen atenolol (TENORMIN) 50 MG tablet Take 1 tablet (50 mg total) by mouth every morning. 90 tablet 4  . benazepril-hydrochlorthiazide (LOTENSIN HCT) 20-25 MG tablet Take 1 tablet by mouth daily. 90 tablet 4  . rivaroxaban (XARELTO) 20 MG TABS tablet Take 1 tablet (20 mg total) by mouth daily with supper. 90 tablet 4   No current facility-administered medications for this visit.      Past Medical History:  Diagnosis Date  . A-fib ([place])   . Arthritis   . DVT (deep venous thrombosis) ([place])   . Hyperlipidemia   . Hypertension   . Personal history of thrombophlebitis   . Pulmonary embolism ([place]) 11/26/2007   Bilateral    Past Surgical History:  Procedure Laterality Date  . COLONOSCOPY WITH PROPOFOL N/A 10/20/2017   Procedure: COLONOSCOPY WITH PROPOFOL;  Surgeon: Jonathon Bellows, MD;  Location: [facility] ENDOSCOPY;  Service: Gastroenterology;  Laterality: N/A;  . COLONOSCOPY WITH PROPOFOL N/A 03/02/2018   Procedure: COLONOSCOPY WITH PROPOFOL;  Surgeon: Jonathon Bellows, MD;  Location: [facility] ENDOSCOPY;  Service: Gastroenterology;  Laterality: N/A;  . HAND SURGERY Left 11/09/2014  . JOINT REPLACEMENT Left    knee  . KNEE ARTHROPLASTY Left 07/31/2015   Procedure: COMPUTER ASSISTED TOTAL KNEE ARTHROPLASTY;  Surgeon: Dereck Leep, MD;  Location: [facility] ORS;  Service: Orthopedics;  Laterality: Left;  . KNEE SURGERY  1975  . PERIPHERAL VASCULAR CATHETERIZATION N/A 07/25/2015   Procedure: IVC Filter Insertion;  Surgeon: Katha Cabal, MD;  Location: [place] CV LAB;  Service: Cardiovascular;  Laterality: N/A;  . PERIPHERAL VASCULAR CATHETERIZATION N/A 08/29/2015   Procedure: IVC Filter Removal;  Surgeon: Katha Cabal, MD;  Location: [place] CV LAB;  Service: Cardiovascular;  Laterality: N/A;    Family History  Problem Relation Age of Onset  . Hypertension Mother   . Hypertension Brother   . Diabetes Brother   . Dementia Brother   No aneurysms  Social History Social History   Tobacco Use  . Smoking status: Never Smoker  . Smokeless tobacco: Never Used  Substance Use Topics  . Alcohol use: Yes    Alcohol/week: 7.0 standard drinks    Types: 7 Cans of beer per week  . Drug use: No  Married  No Known Allergies      REVIEW OF SYSTEMS (Negative unless checked)  Constitutional: [] Weight loss  [] Fever  []   Chills Cardiac: [] Chest Bennett   [] Chest pressure   [] Palpitations   [] Shortness of breath when laying flat   [] Shortness of breath at rest   [] Shortness of breath with exertion. Vascular:  [] Bennett in legs with walking   [] Bennett in legs at rest   [] Bennett in legs when laying flat   [] Claudication   [] Bennett in feet when walking  [] Bennett in feet at rest  [] Bennett in feet when laying flat   [x] History of DVT   [x] Phlebitis   [x] Swelling in legs   [] Varicose veins   [] Non-healing ulcers Pulmonary:   [] Uses home oxygen   [] Productive cough   [] Hemoptysis   [] Wheeze  [] COPD    [] Asthma Neurologic:  [] Dizziness  [] Blackouts   [] Seizures   [] History of stroke   [] History of TIA  [] Aphasia   [] Temporary blindness   [] Dysphagia   [] Weakness or numbness in arms   [] Weakness or numbness in legs Musculoskeletal:  [x] Arthritis   [] Joint swelling   [x] Joint Bennett   [] Low back Bennett Hematologic:  [] Easy bruising  [] Easy bleeding   [] Hypercoagulable state   [] Anemic  [] Hepatitis  Gastrointestinal:  [] Blood in stool   [] Vomiting blood  [] Gastroesophageal reflux/heartburn   [] Difficulty swallowing   Genitourinary:  [] Chronic kidney disease   [] Difficult urination  [] Frequent urination  [] Burning with urination   [] Blood in urine Skin:  [] Rashes   [] Ulcers   [] Wounds Psychological:  [] History of anxiety   []  History of major depression.  Physical Exam BP 126/77 (BP Location: Left Arm, Patient Position: Sitting)   Pulse (!) 52   Resp 16   Ht 6' (1.829 m)   Wt (!) 307 lb (139.3 kg)   BMI 41.64 kg/m   Gen:  WD/WN, NAD.  Obese Head: [place]/AT, No temporalis wasting.  Ear/Nose/Throat: Hearing grossly intact, nares w/o erythema or drainage, oropharynx w/o Erythema/Exudate Eyes: Sclera non-icteric, conjunctiva clear Neck: Trachea midline.  No JVD.  Pulmonary:  Good air movement, no use of accessory muscles.  Cardiac: RRR, normal S1, S2. Vascular:  Vessel Right Left  Radial Palpable Palpable                                   Musculoskeletal: M/S 5/5 throughout.  Extremities without ischemic changes.  No deformity or atrophy.  Mild to moderate right lower extremity edema, mild left lower extremity edema Neurologic:  Sensation grossly intact in extremities.  Symmetrical.  Speech is fluent. Motor exam as listed above. Psychiatric: Judgment intact, Mood & affect appropriate for pt's clinical situation. Dermatologic: No rashes or ulcers noted.  No cellulitis or open wounds.   Radiology No results found.  Labs Recent Results (from the past 2160 hour(s))  LP+ALT+AST [place],  [place]     Status: Abnormal   Collection Time: 05/28/18  4:01 PM  Result Value Ref Range   ALT (SGPT) Piccolo, Waived 28 10 - 47 U/L   AST (SGOT) Piccolo, Waived 21 11 - 38 U/L   Cholesterol Piccolo, Waived 167 <200 mg/dL    Comment:                         Desirable                <200                         Borderline High  200- 239                         High                     >239    HDL Chol Piccolo, Waived 54 (L) >59 mg/dL    Comment:                         Low HDL- Risk Factor     < 40                         High HDL- Negative       > 59                          Risk Factor (Desirable)    Triglycerides Piccolo,Waived 113 <150 mg/dL    Comment:                         Normal                   <150                         Borderline High     150 - 199                         High                200 - 499                         Very High                >499    Chol/HDL Ratio Piccolo,Waive 3.1 mg/dL    Comment:                                        Male      Male                         Low Risk      < 5.0      < 4.5                         High Risk     > 4.9      > 4.4    LDL Chol Calc Piccolo Waived 91 <100 mg/dL    Comment:                         Optimal                  <100                         Near Optimal        100 - 129                         Borderline High     130 - 159  High                160 - 189                         Very High                >189    VLDL Chol Calc Piccolo,Waive 23 <30 mg/dL    Comment:                         Normal                   < 30                         High                     > 29   CBC With Differential/Platelet     Status: None   Collection Time: 05/28/18  4:01 PM  Result Value Ref Range   WBC 8.1 3.4 - 10.8 x10E3/uL   RBC 5.15 4.14 - 5.80 x10E6/uL   Hemoglobin 16.6 13.0 - 17.7 g/dL   Hematocrit 48.6 37.5 - 51.0 %   MCV 94 79 - 97 fL   MCH 32.2 26.6 - 33.0 pg   MCHC 34.2 31.5 -  35.7 g/dL   RDW 15.1 12.3 - 15.4 %   Platelets 201 150 - 450 x10E3/uL   Neutrophils 74 Not Estab. %   Lymphs 18 Not Estab. %   MID 8 Not Estab. %   Neutrophils Absolute 5.9 1.4 - 7.0 x10E3/uL   Lymphocytes Absolute 1.5 0.7 - 3.1 x10E3/uL   MID (Absolute) 0.7 0.1 - 1.6 [id number]/DZ  Basic metabolic panel     Status: None   Collection Time: 05/28/18  4:29 PM  Result Value Ref Range   Glucose 87 65 - 99 mg/dL   BUN 12 8 - 27 mg/dL   Creatinine, Ser 0.98 0.76 - 1.27 mg/dL   GFR calc non Af Amer 76 >59 mL/min/1.73   GFR calc Af Amer 87 >59 mL/min/1.73   BUN/Creatinine Ratio 12 10 - 24   Sodium 141 134 - 144 mmol/L   Potassium 4.8 3.5 - 5.2 mmol/L   Chloride 103 96 - 106 mmol/L   CO2 25 20 - 29 mmol/L   Calcium 9.7 8.6 - 10.2 mg/dL    Assessment/Plan:  Hyperlipidemia lipid control important in reducing the progression of atherosclerotic disease.  Not currently taking a statin   A-fib On anticoagulation and rate controlled  Hypertension blood pressure control important in reducing the progression of atherosclerotic disease. On appropriate oral medications.   Arthritis Severe right knee arthritis requiring a total knee replacement.  Given his previous history of thromboembolic complications, he will be a very high risk for thromboembolic complications around the time of the major joint replacement.  For this reason, IVC filter placement and subsequent removal after surgery is a very reasonable option.  I discussed the risks and benefits of IVC filter placement and subsequent removal.  I discussed the continuation of anticoagulation around the time of surgery as much as possible that will be safely allowable for surgery also being in his best interest.  The patient and his wife voiced their understanding and desire to proceed with IVC filter placement prior to his knee replacement in February.  DVT (deep venous  thrombosis) (HCC) Severe right knee arthritis requiring a total knee  replacement.  Given his previous history of thromboembolic complications, he will be a very high risk for thromboembolic complications around the time of the major joint replacement.  For this reason, IVC filter placement and subsequent removal after surgery is a very reasonable option.  I discussed the risks and benefits of IVC filter placement and subsequent removal.  I discussed the continuation of anticoagulation around the time of surgery as much as possible that will be safely allowable for surgery also being in his best interest.  The patient and his wife voiced their understanding and desire to proceed with IVC filter placement prior to his knee replacement in February.     Hector Bennett 08/25/2018, 11:31 AM   This note was created with [facility] medical dictation system.  Any errors from dictation are unintentional.

## 2018-08-25 NOTE — Assessment & Plan Note (Signed)
Severe right knee arthritis requiring a total knee replacement.  Given his previous history of thromboembolic complications, he will be a very high risk for thromboembolic complications around the time of the major joint replacement.  For this reason, IVC filter placement and subsequent removal after surgery is a very reasonable option.  I discussed the risks and benefits of IVC filter placement and subsequent removal.  I discussed the continuation of anticoagulation around the time of surgery as much as possible that will be safely allowable for surgery also being in his best interest.  The patient and his wife voiced their understanding and desire to proceed with IVC filter placement prior to his knee replacement in February.

## 2018-08-25 NOTE — Assessment & Plan Note (Signed)
blood pressure control important in reducing the progression of atherosclerotic disease. On appropriate oral medications.  

## 2018-08-25 NOTE — Assessment & Plan Note (Signed)
On anticoagulation and rate controlled 

## 2018-08-25 NOTE — Patient Instructions (Signed)
Inferior Vena Cava Filter Insertion Insertion of an inferior vena cava (IVC) filter is a procedure in which a filter is placed into the large vein in your abdomen that carries blood from the lower part of your body to your heart (inferior vena cava). This filter helps to prevent blood clots in the legs or pelvis from traveling to your lungs, which can be very dangerous. The filter is a small, metal device that is shaped like the spokes of an umbrella. The filter is inserted through a pathway that is created in the neck or groin. Filters are inserted when blood thinners (anticoagulants) cannot be used to prevent blood clots from forming. You may need filters rather than anticoagulants because you have:  Severe platelet problems or shortages.  Recent or current major bleeding that cannot be treated.  Bleeding associated with anticoagulants.  Recurrence of blood clots while taking anticoagulants.  A need for surgery in the near future.  Bleeding in your head.  Multiple broken bones. Tell a health care provider about:  Any allergies you have, including iodine.  All medicines you are taking, including vitamins, herbs, eye drops, creams, and over-the-counter medicines.  Any blood disorders you have.  Any problems you or family members have had with anesthetic medicines.  Any medical conditions you have.  Any surgeries you have had.  Whether you are pregnant or may be pregnant. What are the risks? Generally, this is a safe procedure. However, problems may occur, including:  The filter blocking the inferior vena cava. This can cause leg swelling.  The filter eventually failing and not working properly.  The filter moving and traveling to the heart or lungs.  Damage to the vein. This is rare.  Bleeding.  Allergic reactions to medicines or dyes.  Damage to other structures or organs.  Infection.  A pool of blood (hematoma) around the site where a flexible tube is put into a  large vein (catheter insertion site). What happens before the procedure? Staying hydrated Follow instructions from your health care provider about hydration, which may include:  Up to 2 hours before the procedure - you may continue to drink clear liquids, such as water, clear fruit juice, black coffee, and plain tea. Eating and drinking restrictions Follow instructions from your health care provider about eating and drinking, which may include:  8 hours before the procedure - stop eating heavy meals or foods such as meat, fried foods, or fatty foods.  6 hours before the procedure - stop eating light meals or foods, such as toast or cereal.  6 hours before the procedure - stop drinking milk or drinks that contain milk.  2 hours before the procedure - stop drinking clear liquids. Medicines   Ask your health care provider about: ? Changing or stopping your regular medicines. This is especially important if you are taking diabetes medicines or blood thinners. ? Taking medicines such as aspirin and ibuprofen. These medicines can thin your blood. Do not take these medicines before your procedure if your health care provider instructs you not to.  You may be given antibiotic medicine to help prevent infection. General instructions  Ask your health care provider how your surgical site will be marked or identified.  You may have blood tests. These tests can help to tell how well your kidneys and liver are working. They can also show how fast your blood is clotting.  You may be asked to shower with a germ-killing soap.  Plan to have someone take you   home from the hospital or clinic.  If you will be going home right after the procedure, plan to have someone with you for 24 hours.  Do not use any products that contain nicotine or tobacco, such as cigarettes and e-cigarettes. If you need help quitting, ask your health care provider. What happens during the procedure?  To lower your risk of  infection: ? Your health care team will wash or sanitize their hands. ? Your skin will be washed with soap. ? Hair may be removed from the surgical area.  An IV tube will be inserted into one of your veins.  You will be given one or more of the following: ? A medicine to help you relax (sedative). ? A medicine to numb the area (local anesthetic).  The procedure is done through a large vein in your neck or groin. A small cut (incision) will be made in this area.  A flexible tube (catheter) will be put into a large vein where the incision was made.  Contrast dye may be injected into the inferior vena cava to help guide the catheter.  X-rays may be used to make sure that the catheter is in the correct position.  The IVC filter will be inserted into the vein through the catheter until it reaches the correct location in the inferior vena cava.  The catheter will be removed.  Pressure will be applied to the insertion site to stop bleeding.  A bandage (dressing) may be applied over the catheter insertion site.  Your IV tube will be taken out. The procedure may vary among health care providers and hospitals. What happens after the procedure?  Your blood pressure, heart rate, breathing rate, and blood oxygen level will be monitored until the medicines you were given have worn off.  Your insertion site will be monitored for the first few hours for any signs of bleeding.  Do not drive for 24 hours if you were given a sedative. Summary  The inferior vena cava filter helps to prevent blood clots in the legs or pelvis from traveling to your lungs.  The IVC filter is inserted when anticoagulants cannot be used to prevent blood clots.  Plan to have someone take you home from the hospital or clinic, and plan to have someone with you for 24 hours if you will be going home right after the procedure. This information is not intended to replace advice given to you by your health care provider.  Make sure you discuss any questions you have with your health care provider. Document Released: 10/02/2005 Document Revised: 07/03/2016 Document Reviewed: 07/03/2016 Elsevier Interactive Patient Education  2019 [employer]  

## 2018-09-07 ENCOUNTER — Encounter (INDEPENDENT_AMBULATORY_CARE_PROVIDER_SITE_OTHER): Payer: Self-pay

## 2018-09-11 ENCOUNTER — Telehealth (INDEPENDENT_AMBULATORY_CARE_PROVIDER_SITE_OTHER): Payer: Self-pay

## 2018-09-11 NOTE — Telephone Encounter (Signed)
Patient's wife called and left a message wanting to know day/time of patient's angio procedure at [facility]. I returned the call and spoke with the patient and the patient stated he received his letter today for his IVC filter angio on 10/05/2018.

## 2018-09-28 NOTE — Discharge Instructions (Signed)
°  Instructions after Total Knee Replacement ° ° Dareen Gutzwiller P. Tiondra Fang, Jr., M.D.    ° Dept. of Orthopaedics & Sports Medicine ° [facility] ° [address] ° [place], [place]  [address] ° Phone: [phone]   Fax: [phone] ° °  °DIET: °• Drink plenty of non-alcoholic fluids. °• Resume your normal diet. Include foods high in fiber. ° °ACTIVITY:  °• You may use crutches or a walker with weight-bearing as tolerated, unless instructed otherwise. °• You may be weaned off of the walker or crutches by your Physical Therapist.  °• Do NOT place pillows under the knee. Anything placed under the knee could limit your ability to straighten the knee.   °• Continue doing gentle exercises. Exercising will reduce the pain and swelling, increase motion, and prevent muscle weakness.   °• Please continue to use the TED compression stockings for 6 weeks. You may remove the stockings at night, but should reapply them in the morning. °• Do not drive or operate any equipment until instructed. ° °WOUND CARE:  °• Continue to use the PolarCare or ice packs periodically to reduce pain and swelling. °• You may bathe or shower after the staples are removed at the first office visit following surgery. ° °MEDICATIONS: °• You may resume your regular medications. °• Please take the pain medication as prescribed on the medication. °• Do not take pain medication on an empty stomach. °• You have been given a prescription for a blood thinner (Lovenox or Coumadin). Please take the medication as instructed. (NOTE: After completing a 2 week course of Lovenox, take one Enteric-coated aspirin once a day. This along with elevation will help reduce the possibility of phlebitis in your operated leg.) °• Do not drive or drink alcoholic beverages when taking pain medications. ° °CALL THE OFFICE FOR: °• Temperature above 101 degrees °• Excessive bleeding or drainage on the dressing. °• Excessive swelling, coldness, or paleness of the toes. °• Persistent  nausea and vomiting. ° °FOLLOW-UP:  °• You should have an appointment to return to the office in 10-14 days after surgery. °• Arrangements have been made for continuation of Physical Therapy (either home therapy or outpatient therapy). °  °

## 2018-09-29 ENCOUNTER — Other Ambulatory Visit: Payer: Self-pay

## 2018-09-29 ENCOUNTER — Encounter
Admission: RE | Admit: 2018-09-29 | Discharge: 2018-09-29 | Disposition: A | Discharge status: Home / Self Care | Payer: Medicare Other | Source: Ambulatory Visit | Attending: Orthopedic Surgery | Admitting: Orthopedic Surgery

## 2018-09-29 DIAGNOSIS — M25561 Pain in right knee: Secondary | ICD-10-CM | POA: Diagnosis not present

## 2018-09-29 DIAGNOSIS — R9431 Abnormal electrocardiogram [ECG] [EKG]: Secondary | ICD-10-CM | POA: Insufficient documentation

## 2018-09-29 DIAGNOSIS — Z01818 Encounter for other preprocedural examination: Secondary | ICD-10-CM | POA: Insufficient documentation

## 2018-09-29 DIAGNOSIS — Z01810 Encounter for preprocedural cardiovascular examination: Secondary | ICD-10-CM | POA: Diagnosis not present

## 2018-09-29 DIAGNOSIS — I4510 Unspecified right bundle-branch block: Secondary | ICD-10-CM | POA: Diagnosis not present

## 2018-09-29 LAB — COMPREHENSIVE METABOLIC PANEL
ALT: 15 U/L (ref 0–44)
AST: 19 U/L (ref 15–41)
Albumin: 3.9 g/dL (ref 3.5–5.0)
Alkaline Phosphatase: 55 U/L (ref 38–126)
Anion gap: 7 (ref 5–15)
BUN: 11 mg/dL (ref 8–23)
CHLORIDE: 105 mmol/L (ref 98–111)
CO2: 25 mmol/L (ref 22–32)
Calcium: 9 mg/dL (ref 8.9–10.3)
Creatinine, Ser: 0.8 mg/dL (ref 0.61–1.24)
GFR calc non Af Amer: >60 mL/min (ref >60)
Glucose, Bld: 98 mg/dL (ref 70–99)
Potassium: 3.6 mmol/L (ref 3.5–5.1)
Sodium: 137 mmol/L (ref 135–145)
Total Bilirubin: 1.2 mg/dL (ref 0.3–1.2)
Total Protein: 7.2 g/dL (ref 6.5–8.1)

## 2018-09-29 LAB — URINALYSIS, ROUTINE W REFLEX MICROSCOPIC
Bilirubin Urine: NEGATIVE
Glucose, UA: NEGATIVE mg/dL
Hgb urine dipstick: NEGATIVE
Ketones, ur: NEGATIVE mg/dL
Leukocytes, UA: NEGATIVE
Nitrite: NEGATIVE
PROTEIN: NEGATIVE mg/dL
Specific Gravity, Urine: 1.014 (ref 1.005–1.030)
pH: 5 (ref 5.0–8.0)

## 2018-09-29 LAB — APTT: aPTT: 33 seconds (ref 24–36)

## 2018-09-29 LAB — CBC
HCT: 48.3 % (ref 39.0–52.0)
Hemoglobin: 16 g/dL (ref 13.0–17.0)
MCH: 31.6 pg (ref 26.0–34.0)
MCHC: 33.1 g/dL (ref 30.0–36.0)
MCV: 95.3 fL (ref 80.0–100.0)
PLATELETS: 218 10*3/uL (ref 150–400)
RBC: 5.07 MIL/uL (ref 4.22–5.81)
RDW: 13.5 % (ref 11.5–15.5)
WBC: 7.1 10*3/uL (ref 4.0–10.5)
nRBC: 0 % (ref 0.0–0.2)

## 2018-09-29 LAB — SEDIMENTATION RATE: SED RATE: 1 mm/h (ref 0–20)

## 2018-09-29 LAB — C-REACTIVE PROTEIN: CRP: <0.8 mg/dL (ref <1.0)

## 2018-09-29 LAB — PROTIME-INR
INR: 1.16
Prothrombin Time: 14.7 seconds (ref 11.4–15.2)

## 2018-09-29 LAB — SURGICAL PCR SCREEN
MRSA, PCR: NEGATIVE
Staphylococcus aureus: NEGATIVE

## 2018-09-29 NOTE — Patient Instructions (Addendum)
Your procedure is scheduled on: Monday 10/12/18.   Report to DAY SURGERY DEPARTMENT LOCATED ON 2ND FLOOR MEDICAL MALL ENTRANCE. To find out your arrival time please call [phone] between 1PM - 3PM on Friday 10/09/18.   Remember: Instructions that are not followed completely may result in serious medical risk, up to and including death, or upon the discretion of your surgeon and anesthesiologist your surgery may need to be rescheduled.      _X__ 1. Do not eat food after midnight the night before your procedure.                 No gum chewing or hard candies. You may drink clear liquids up to 2 hours                 before you are scheduled to arrive for your surgery- DO NOT drink clear                 liquids within 2 hours of the start of your surgery.                 Clear Liquids include:  water, apple juice without pulp, clear carbohydrate                 drink such as Clearfast or Gatorade, Black Coffee or Tea (Do not add                 milk or creamer to coffee or tea).   __X__2.  On the morning of surgery brush your teeth with toothpaste and water, you may rinse your mouth with mouthwash if you wish.  Do not swallow any toothpaste or mouthwash.      _X__ 3.  No Alcohol for 24 hours before or after surgery.   __X__4.  Notify your doctor if there is any change in your medical condition      (cold, fever, infections).     Do not wear jewelry, make-up, hairpins, clips or nail polish. Do not wear lotions, powders, or perfumes. You may wear your deodorant if you wish. Do not shave 48 hours prior to surgery. Men may shave face and neck. Do not bring valuables to the hospital.      [facility] is not responsible for any belongings or valuables.  Contacts, dentures/partials or body piercings may not be worn into surgery. Bring a case for your contacts, glasses or hearing aids, a denture cup will be supplied.   Leave your suitcase in the car. After surgery it may be brought  to your room.   For patients admitted to the hospital, discharge time is determined by your treatment team.    Please read over the following fact sheets that you were given:   MRSA Information   __X__ Take these medicines the morning of surgery with A SIP OF WATER:     1. atenolol (TENORMIN) 50 MG tablet     __X__ Use CHG Soap as directed   __X__ Stop Blood Thinners: Xarelto 3 days prior to your procedure according to your doctor's instructions. Your last dose will be on: Thursday 10/08/18.   __X__ Stop Anti-inflammatories 7 days before surgery such as Advil, Ibuprofen, Motrin, BC or Goodies Powder, Naprosyn, Naproxen, Aleve, Aspirin, Meloxicam. May take Tylenol if needed for pain or discomfort.    __X__ Don't start taking any herbal supplements until after your procedure.

## 2018-09-30 LAB — URINE CULTURE
Culture: NO GROWTH
Special Requests: NORMAL

## 2018-10-05 ENCOUNTER — Other Ambulatory Visit (INDEPENDENT_AMBULATORY_CARE_PROVIDER_SITE_OTHER): Payer: Self-pay | Admitting: Nurse Practitioner

## 2018-10-05 ENCOUNTER — Ambulatory Visit
Admission: RE | Admit: 2018-10-05 | Discharge: 2018-10-05 | Disposition: A | Discharge status: Home / Self Care | Payer: Medicare Other | Attending: Vascular Surgery | Admitting: Vascular Surgery

## 2018-10-05 ENCOUNTER — Other Ambulatory Visit: Payer: Self-pay

## 2018-10-05 ENCOUNTER — Encounter: Payer: Self-pay | Admitting: Emergency Medicine

## 2018-10-05 ENCOUNTER — Encounter
Admission: RE | Disposition: A | Discharge status: Home / Self Care | Payer: Self-pay | Source: Home / Self Care | Attending: Vascular Surgery

## 2018-10-05 DIAGNOSIS — E669 Obesity, unspecified: Secondary | ICD-10-CM | POA: Diagnosis not present

## 2018-10-05 DIAGNOSIS — I4891 Unspecified atrial fibrillation: Secondary | ICD-10-CM | POA: Diagnosis not present

## 2018-10-05 DIAGNOSIS — Z86718 Personal history of other venous thrombosis and embolism: Secondary | ICD-10-CM | POA: Diagnosis not present

## 2018-10-05 DIAGNOSIS — E785 Hyperlipidemia, unspecified: Secondary | ICD-10-CM | POA: Insufficient documentation

## 2018-10-05 DIAGNOSIS — I82401 Acute embolism and thrombosis of unspecified deep veins of right lower extremity: Secondary | ICD-10-CM | POA: Diagnosis not present

## 2018-10-05 DIAGNOSIS — Z86711 Personal history of pulmonary embolism: Secondary | ICD-10-CM | POA: Diagnosis not present

## 2018-10-05 DIAGNOSIS — Z823 Family history of stroke: Secondary | ICD-10-CM | POA: Diagnosis not present

## 2018-10-05 DIAGNOSIS — I10 Essential (primary) hypertension: Secondary | ICD-10-CM | POA: Diagnosis not present

## 2018-10-05 DIAGNOSIS — Z6841 Body Mass Index (BMI) 40.0 and over, adult: Secondary | ICD-10-CM | POA: Insufficient documentation

## 2018-10-05 DIAGNOSIS — Z79899 Other long term (current) drug therapy: Secondary | ICD-10-CM | POA: Diagnosis not present

## 2018-10-05 DIAGNOSIS — Z7901 Long term (current) use of anticoagulants: Secondary | ICD-10-CM | POA: Diagnosis not present

## 2018-10-05 HISTORY — PX: IVC FILTER INSERTION: CATH118245

## 2018-10-05 SURGERY — IVC FILTER INSERTION
Anesthesia: Moderate Sedation | Laterality: Right

## 2018-10-05 MED ORDER — FENTANYL CITRATE (PF) 100 MCG/2ML IJ SOLN
INTRAMUSCULAR | Status: AC
  Filled 2018-10-05: qty 2

## 2018-10-05 MED ORDER — HYDROMORPHONE HCL 1 MG/ML IJ SOLN: 1.0000 mg | Freq: Once | INTRAMUSCULAR | Status: DC | PRN

## 2018-10-05 MED ORDER — HEPARIN (PORCINE) IN NACL 1000-0.9 UT/500ML-% IV SOLN
INTRAVENOUS | Status: AC
  Filled 2018-10-05: qty 500

## 2018-10-05 MED ORDER — SODIUM CHLORIDE 0.9 % IV SOLN
INTRAVENOUS | Status: DC
  Administered 2018-10-05: 07:00:00 via INTRAVENOUS

## 2018-10-05 MED ORDER — MIDAZOLAM HCL 2 MG/2ML IJ SOLN
INTRAMUSCULAR | Status: DC | PRN
  Administered 2018-10-05: 2 mg via INTRAVENOUS

## 2018-10-05 MED ORDER — METHYLPREDNISOLONE SODIUM SUCC 125 MG IJ SOLR: 125.0000 mg | Freq: Once | INTRAMUSCULAR | Status: DC | PRN

## 2018-10-05 MED ORDER — CEFAZOLIN SODIUM-DEXTROSE 2-4 GM/100ML-% IV SOLN
2.0000 g | Freq: Once | INTRAVENOUS | Status: AC
  Administered 2018-10-05: 2 g via INTRAVENOUS

## 2018-10-05 MED ORDER — IOPAMIDOL (ISOVUE-300) INJECTION 61%
INTRAVENOUS | Status: DC | PRN
  Administered 2018-10-05: 10 mL via INTRA_ARTERIAL

## 2018-10-05 MED ORDER — ONDANSETRON HCL 4 MG/2ML IJ SOLN: 4.0000 mg | Freq: Four times a day (QID) | INTRAMUSCULAR | Status: DC | PRN

## 2018-10-05 MED ORDER — FAMOTIDINE 20 MG PO TABS: 40.0000 mg | ORAL_TABLET | Freq: Once | ORAL | Status: DC | PRN

## 2018-10-05 MED ORDER — FENTANYL CITRATE (PF) 100 MCG/2ML IJ SOLN
INTRAMUSCULAR | Status: DC | PRN
  Administered 2018-10-05: 50 ug via INTRAVENOUS

## 2018-10-05 MED ORDER — CEFAZOLIN SODIUM-DEXTROSE 2-4 GM/100ML-% IV SOLN
INTRAVENOUS | Status: AC
  Filled 2018-10-05: qty 100

## 2018-10-05 MED ORDER — DEXTROSE 5 % IV SOLN: 2.0000 g | Freq: Once | INTRAVENOUS | Status: DC

## 2018-10-05 MED ORDER — MIDAZOLAM HCL 2 MG/ML PO SYRP: 8.0000 mg | ORAL_SOLUTION | Freq: Once | ORAL | Status: DC | PRN

## 2018-10-05 MED ORDER — LIDOCAINE-EPINEPHRINE (PF) 1 %-1:200000 IJ SOLN
INTRAMUSCULAR | Status: AC
  Filled 2018-10-05: qty 30

## 2018-10-05 MED ORDER — DIPHENHYDRAMINE HCL 50 MG/ML IJ SOLN: 50.0000 mg | Freq: Once | INTRAMUSCULAR | Status: DC | PRN

## 2018-10-05 MED ORDER — MIDAZOLAM HCL 5 MG/5ML IJ SOLN
INTRAMUSCULAR | Status: AC
  Filled 2018-10-05: qty 5

## 2018-10-05 SURGICAL SUPPLY — 3 items
FILTER VC CELECT-FEMORAL (Filter) ×3 IMPLANT
PACK ANGIOGRAPHY (CUSTOM PROCEDURE TRAY) ×3 IMPLANT
WIRE J 3MM .035X145CM (WIRE) ×3 IMPLANT

## 2018-10-05 NOTE — H&P (Signed)
[facility] History & Physical Update  The patient was interviewed and re-examined.  The patient's previous History and Physical has been reviewed and is unchanged.  There is no change in the plan of care. We plan to proceed with the scheduled procedure.  Leotis Pain, MD  10/05/2018, 8:14 AM

## 2018-10-05 NOTE — Op Note (Signed)
[facility]   OPERATIVE NOTE    PRE-OPERATIVE DIAGNOSIS: PE/DVT history with upcoming knee replacement  POST-OPERATIVE DIAGNOSIS: same as above  PROCEDURE: 1.   Ultrasound guidance for vascular access to the right femoral vein 2.   Catheter placement into the inferior vena cava 3.   Inferior venacavogram 4.   Placement of a Cook Celect IVC filter  SURGEON: Leotis Pain, MD  ASSISTANT(S): None  ANESTHESIA: local with Moderate Conscious Sedation for approximately 15 minutes using 2 mg of Versed and 50 mcg of Fentanyl  ESTIMATED BLOOD LOSS: minimal  CONTRAST: 15 cc  FLUORO TIME: less than one minute  FINDING(S): 1.  Patent IVC  SPECIMEN(S):  none  INDICATIONS:   Hector Bennett is a 73 y.o. male who presents with upcoming knee surgery and PE/DVT previously.  Inferior vena cava filter is indicated for this reason.  Risks and benefits including filter thrombosis, migration, fracture, bleeding, and infection were all discussed.  We discussed that all IVC filters that we place can be removed if desired from the patient once the need for the filter has passed.    DESCRIPTION: After obtaining full informed written consent, the patient was brought back to the vascular suite. The skin was sterilely prepped and draped in a sterile surgical field was created. Moderate conscious sedation was administered during a face to face encounter with the patient throughout the procedure with my supervision of the RN administering medicines and monitoring the patient's vital signs, pulse oximetry, telemetry and mental status throughout from the start of the procedure until the patient was taken to the recovery room. The right femoral was accessed under direct ultrasound guidance without difficulty with a Seldinger needle and a J-wire was then placed. After skin nick and dilatation, the delivery sheath was placed into the inferior vena cava and an inferior venacavogram was performed.  This demonstrated a patent IVC with the level of the renal veins at L1.  The filter was then deployed into the inferior vena cava at the level of L2 just below the renal veins. The delivery sheath was then removed. Pressure was held. Sterile dressings were placed. The patient tolerated the procedure well and was taken to the recovery room in stable condition.  COMPLICATIONS: None  CONDITION: Stable  Leotis Pain  10/05/2018, 8:36 AM   This note was created with Dragon Medical transcription system. Any errors in dictation are purely unintentional.

## 2018-10-06 ENCOUNTER — Encounter: Payer: Self-pay | Admitting: Vascular Surgery

## 2018-10-09 LAB — TYPE AND SCREEN
ABO/RH(D): A NEG
Antibody Screen: NEGATIVE

## 2018-10-11 MED ORDER — TRANEXAMIC ACID-NACL 1000-0.7 MG/100ML-% IV SOLN
1000.0000 mg | INTRAVENOUS | Status: DC
  Filled 2018-10-11: qty 100

## 2018-10-11 MED ORDER — CEFAZOLIN SODIUM-DEXTROSE 2-4 GM/100ML-% IV SOLN: 2.0000 g | INTRAVENOUS | Status: DC

## 2018-10-12 ENCOUNTER — Inpatient Hospital Stay
Admission: RE | Admit: 2018-10-12 | Discharge: 2018-10-14 | DRG: 470 | Disposition: A | Discharge status: Home Health | Payer: Medicare Other | Source: Ambulatory Visit | Attending: Orthopedic Surgery | Admitting: Orthopedic Surgery

## 2018-10-12 ENCOUNTER — Inpatient Hospital Stay: Payer: Medicare Other | Admitting: Anesthesiology

## 2018-10-12 ENCOUNTER — Encounter: Payer: Self-pay | Admitting: Orthopedic Surgery

## 2018-10-12 ENCOUNTER — Inpatient Hospital Stay: Payer: Medicare Other

## 2018-10-12 ENCOUNTER — Other Ambulatory Visit: Payer: Self-pay

## 2018-10-12 ENCOUNTER — Encounter
Admission: RE | Disposition: A | Discharge status: Home Health | Payer: Self-pay | Source: Ambulatory Visit | Attending: Orthopedic Surgery

## 2018-10-12 DIAGNOSIS — Z96659 Presence of unspecified artificial knee joint: Secondary | ICD-10-CM

## 2018-10-12 DIAGNOSIS — Z23 Encounter for immunization: Secondary | ICD-10-CM | POA: Diagnosis present

## 2018-10-12 DIAGNOSIS — Z79899 Other long term (current) drug therapy: Secondary | ICD-10-CM | POA: Diagnosis not present

## 2018-10-12 DIAGNOSIS — I4891 Unspecified atrial fibrillation: Secondary | ICD-10-CM | POA: Diagnosis present

## 2018-10-12 DIAGNOSIS — Z7901 Long term (current) use of anticoagulants: Secondary | ICD-10-CM | POA: Diagnosis not present

## 2018-10-12 DIAGNOSIS — E785 Hyperlipidemia, unspecified: Secondary | ICD-10-CM | POA: Diagnosis not present

## 2018-10-12 DIAGNOSIS — Z96651 Presence of right artificial knee joint: Secondary | ICD-10-CM | POA: Diagnosis not present

## 2018-10-12 DIAGNOSIS — I10 Essential (primary) hypertension: Secondary | ICD-10-CM | POA: Diagnosis not present

## 2018-10-12 DIAGNOSIS — Z471 Aftercare following joint replacement surgery: Secondary | ICD-10-CM | POA: Diagnosis not present

## 2018-10-12 DIAGNOSIS — Z86711 Personal history of pulmonary embolism: Secondary | ICD-10-CM

## 2018-10-12 DIAGNOSIS — Z6841 Body Mass Index (BMI) 40.0 and over, adult: Secondary | ICD-10-CM | POA: Diagnosis not present

## 2018-10-12 DIAGNOSIS — Z86718 Personal history of other venous thrombosis and embolism: Secondary | ICD-10-CM | POA: Diagnosis not present

## 2018-10-12 DIAGNOSIS — M1711 Unilateral primary osteoarthritis, right knee: Principal | ICD-10-CM | POA: Diagnosis present

## 2018-10-12 HISTORY — PX: KNEE ARTHROPLASTY: SHX992

## 2018-10-12 LAB — TYPE AND SCREEN
ABO/RH(D): A NEG
Antibody Screen: NEGATIVE

## 2018-10-12 SURGERY — ARTHROPLASTY, KNEE, TOTAL, USING IMAGELESS COMPUTER-ASSISTED NAVIGATION
Anesthesia: Spinal | Site: Knee | Laterality: Right

## 2018-10-12 MED ORDER — RIVAROXABAN 20 MG PO TABS
20.0000 mg | ORAL_TABLET | Freq: Every day | ORAL | Status: DC
  Administered 2018-10-13: 20 mg via ORAL
  Filled 2018-10-12 (×2): qty 1

## 2018-10-12 MED ORDER — SODIUM CHLORIDE 0.9 % IV SOLN
INTRAVENOUS | Status: DC | PRN
  Administered 2018-10-12: 60 mL

## 2018-10-12 MED ORDER — TRANEXAMIC ACID-NACL 1000-0.7 MG/100ML-% IV SOLN
INTRAVENOUS | Status: DC | PRN
  Administered 2018-10-12: 1000 mg via INTRAVENOUS

## 2018-10-12 MED ORDER — SODIUM CHLORIDE FLUSH 0.9 % IV SOLN
INTRAVENOUS | Status: AC
  Filled 2018-10-12: qty 20

## 2018-10-12 MED ORDER — GABAPENTIN 300 MG PO CAPS
300.0000 mg | ORAL_CAPSULE | Freq: Once | ORAL | Status: AC
  Administered 2018-10-12: 300 mg via ORAL

## 2018-10-12 MED ORDER — BUPIVACAINE HCL (PF) 0.25 % IJ SOLN
INTRAMUSCULAR | Status: DC | PRN
  Administered 2018-10-12: 60 mL

## 2018-10-12 MED ORDER — DIPHENHYDRAMINE HCL 12.5 MG/5ML PO ELIX: 12.5000 mg | ORAL_SOLUTION | ORAL | Status: DC | PRN

## 2018-10-12 MED ORDER — LACTATED RINGERS IV SOLN
INTRAVENOUS | Status: DC
  Administered 2018-10-12 (×2): via INTRAVENOUS

## 2018-10-12 MED ORDER — GLYCOPYRROLATE 0.2 MG/ML IJ SOLN
INTRAMUSCULAR | Status: DC | PRN
  Administered 2018-10-12: 0.2 mg via INTRAVENOUS

## 2018-10-12 MED ORDER — MIDAZOLAM HCL 2 MG/2ML IJ SOLN
INTRAMUSCULAR | Status: AC
  Filled 2018-10-12: qty 2

## 2018-10-12 MED ORDER — CELECOXIB 200 MG PO CAPS
200.0000 mg | ORAL_CAPSULE | Freq: Two times a day (BID) | ORAL | Status: DC
  Administered 2018-10-12 – 2018-10-14 (×4): 200 mg via ORAL
  Filled 2018-10-12 (×4): qty 1

## 2018-10-12 MED ORDER — PROPOFOL 500 MG/50ML IV EMUL
INTRAVENOUS | Status: DC | PRN
  Administered 2018-10-12: 40 ug/kg/min via INTRAVENOUS

## 2018-10-12 MED ORDER — PROPOFOL 500 MG/50ML IV EMUL
INTRAVENOUS | Status: AC
  Filled 2018-10-12: qty 50

## 2018-10-12 MED ORDER — ACETAMINOPHEN 325 MG PO TABS: 325.0000 mg | ORAL_TABLET | Freq: Four times a day (QID) | ORAL | Status: DC | PRN

## 2018-10-12 MED ORDER — TETRACAINE HCL 1 % IJ SOLN
INTRAMUSCULAR | Status: DC | PRN
  Administered 2018-10-12: 5 mg via INTRASPINAL

## 2018-10-12 MED ORDER — HYDROMORPHONE HCL 1 MG/ML IJ SOLN: 0.2500 mg | INTRAMUSCULAR | Status: DC | PRN

## 2018-10-12 MED ORDER — ACETAMINOPHEN 10 MG/ML IV SOLN
INTRAVENOUS | Status: AC
  Filled 2018-10-12: qty 100

## 2018-10-12 MED ORDER — FERROUS SULFATE 325 (65 FE) MG PO TABS
325.0000 mg | ORAL_TABLET | Freq: Two times a day (BID) | ORAL | Status: DC
  Administered 2018-10-13 – 2018-10-14 (×3): 325 mg via ORAL
  Filled 2018-10-12 (×3): qty 1

## 2018-10-12 MED ORDER — OXYCODONE HCL 5 MG PO TABS: 5.0000 mg | ORAL_TABLET | ORAL | Status: DC | PRN

## 2018-10-12 MED ORDER — BUPIVACAINE LIPOSOME 1.3 % IJ SUSP
INTRAMUSCULAR | Status: AC
  Filled 2018-10-12: qty 20

## 2018-10-12 MED ORDER — BUPIVACAINE HCL (PF) 0.5 % IJ SOLN
INTRAMUSCULAR | Status: DC | PRN
  Administered 2018-10-12: 2.5 mL

## 2018-10-12 MED ORDER — DEXAMETHASONE SODIUM PHOSPHATE 10 MG/ML IJ SOLN
8.0000 mg | Freq: Once | INTRAMUSCULAR | Status: AC
  Administered 2018-10-12: 8 mg via INTRAVENOUS

## 2018-10-12 MED ORDER — CELECOXIB 200 MG PO CAPS
ORAL_CAPSULE | ORAL | Status: AC
  Filled 2018-10-12: qty 2

## 2018-10-12 MED ORDER — HYDROCHLOROTHIAZIDE 25 MG PO TABS: 25.0000 mg | ORAL_TABLET | Freq: Every day | ORAL | Status: DC

## 2018-10-12 MED ORDER — TRAMADOL HCL 50 MG PO TABS
50.0000 mg | ORAL_TABLET | ORAL | Status: DC | PRN
  Administered 2018-10-12 – 2018-10-14 (×4): 50 mg via ORAL
  Filled 2018-10-12 (×4): qty 1

## 2018-10-12 MED ORDER — CEFAZOLIN SODIUM 1 G IJ SOLR
INTRAMUSCULAR | Status: AC
  Filled 2018-10-12: qty 10

## 2018-10-12 MED ORDER — SODIUM CHLORIDE 0.9 % IV SOLN
INTRAVENOUS | Status: DC | PRN
  Administered 2018-10-12: 30 ug/min via INTRAVENOUS

## 2018-10-12 MED ORDER — SODIUM CHLORIDE 0.9 % IR SOLN
Status: DC | PRN
  Administered 2018-10-12: 3500 mL

## 2018-10-12 MED ORDER — KETAMINE HCL 50 MG/ML IJ SOLN
INTRAMUSCULAR | Status: DC | PRN
  Administered 2018-10-12 (×2): 50 mg via INTRAMUSCULAR

## 2018-10-12 MED ORDER — BISACODYL 10 MG RE SUPP
10.0000 mg | Freq: Every day | RECTAL | Status: DC | PRN
  Administered 2018-10-14: 10 mg via RECTAL
  Filled 2018-10-12: qty 1

## 2018-10-12 MED ORDER — METOCLOPRAMIDE HCL 10 MG PO TABS
10.0000 mg | ORAL_TABLET | Freq: Three times a day (TID) | ORAL | Status: DC
  Administered 2018-10-12 – 2018-10-14 (×6): 10 mg via ORAL
  Filled 2018-10-12 (×6): qty 1

## 2018-10-12 MED ORDER — BACITRACIN 50000 UNITS IM SOLR
INTRAMUSCULAR | Status: AC
  Filled 2018-10-12: qty 2

## 2018-10-12 MED ORDER — FENTANYL CITRATE (PF) 100 MCG/2ML IJ SOLN
INTRAMUSCULAR | Status: AC
  Filled 2018-10-12: qty 2

## 2018-10-12 MED ORDER — ACETAMINOPHEN 10 MG/ML IV SOLN
1000.0000 mg | Freq: Four times a day (QID) | INTRAVENOUS | Status: AC
  Administered 2018-10-12 – 2018-10-13 (×3): 1000 mg via INTRAVENOUS
  Filled 2018-10-12 (×4): qty 100

## 2018-10-12 MED ORDER — MAGNESIUM HYDROXIDE 400 MG/5ML PO SUSP
30.0000 mL | Freq: Every day | ORAL | Status: DC
  Administered 2018-10-13 – 2018-10-14 (×2): 30 mL via ORAL
  Filled 2018-10-12 (×2): qty 30

## 2018-10-12 MED ORDER — CEFAZOLIN SODIUM-DEXTROSE 2-4 GM/100ML-% IV SOLN
INTRAVENOUS | Status: AC
  Filled 2018-10-12: qty 100

## 2018-10-12 MED ORDER — LIDOCAINE HCL (PF) 2 % IJ SOLN
INTRAMUSCULAR | Status: AC
  Filled 2018-10-12: qty 10

## 2018-10-12 MED ORDER — CEFAZOLIN SODIUM-DEXTROSE 2-4 GM/100ML-% IV SOLN
2.0000 g | Freq: Four times a day (QID) | INTRAVENOUS | Status: AC
  Administered 2018-10-12 – 2018-10-13 (×4): 2 g via INTRAVENOUS
  Filled 2018-10-12 (×4): qty 100

## 2018-10-12 MED ORDER — ONDANSETRON HCL 4 MG PO TABS: 4.0000 mg | ORAL_TABLET | Freq: Four times a day (QID) | ORAL | Status: DC | PRN

## 2018-10-12 MED ORDER — BUPIVACAINE HCL (PF) 0.25 % IJ SOLN
INTRAMUSCULAR | Status: AC
  Filled 2018-10-12: qty 60

## 2018-10-12 MED ORDER — PROPOFOL 10 MG/ML IV BOLUS
INTRAVENOUS | Status: DC | PRN
  Administered 2018-10-12 (×4): 26 mg via INTRAVENOUS

## 2018-10-12 MED ORDER — OXYCODONE HCL 5 MG PO TABS: 10.0000 mg | ORAL_TABLET | ORAL | Status: DC | PRN

## 2018-10-12 MED ORDER — PANTOPRAZOLE SODIUM 40 MG PO TBEC
40.0000 mg | DELAYED_RELEASE_TABLET | Freq: Two times a day (BID) | ORAL | Status: DC
  Administered 2018-10-12 – 2018-10-14 (×4): 40 mg via ORAL
  Filled 2018-10-12 (×4): qty 1

## 2018-10-12 MED ORDER — BUPIVACAINE HCL (PF) 0.5 % IJ SOLN
INTRAMUSCULAR | Status: AC
  Filled 2018-10-12: qty 10

## 2018-10-12 MED ORDER — ONDANSETRON HCL 4 MG/2ML IJ SOLN: 4.0000 mg | Freq: Four times a day (QID) | INTRAMUSCULAR | Status: DC | PRN

## 2018-10-12 MED ORDER — CHLORHEXIDINE GLUCONATE 4 % EX LIQD: 60.0000 mL | Freq: Once | CUTANEOUS | Status: DC

## 2018-10-12 MED ORDER — DEXTROSE 5 % IV SOLN
INTRAVENOUS | Status: DC | PRN
  Administered 2018-10-12: 3 g via INTRAVENOUS

## 2018-10-12 MED ORDER — ALUM & MAG HYDROXIDE-SIMETH 200-200-20 MG/5ML PO SUSP: 30.0000 mL | ORAL | Status: DC | PRN

## 2018-10-12 MED ORDER — PHENYLEPHRINE HCL 10 MG/ML IJ SOLN
INTRAMUSCULAR | Status: DC | PRN
  Administered 2018-10-12: 100 ug via INTRAVENOUS

## 2018-10-12 MED ORDER — METOCLOPRAMIDE HCL 10 MG PO TABS: 5.0000 mg | ORAL_TABLET | Freq: Three times a day (TID) | ORAL | Status: DC | PRN

## 2018-10-12 MED ORDER — ATENOLOL 25 MG PO TABS
50.0000 mg | ORAL_TABLET | ORAL | Status: DC
  Administered 2018-10-13 – 2018-10-14 (×2): 50 mg via ORAL
  Filled 2018-10-12 (×2): qty 2

## 2018-10-12 MED ORDER — GABAPENTIN 300 MG PO CAPS
300.0000 mg | ORAL_CAPSULE | Freq: Every day | ORAL | Status: DC
  Administered 2018-10-12 – 2018-10-13 (×2): 300 mg via ORAL
  Filled 2018-10-12 (×2): qty 1

## 2018-10-12 MED ORDER — GABAPENTIN 300 MG PO CAPS
ORAL_CAPSULE | ORAL | Status: AC
  Filled 2018-10-12: qty 1

## 2018-10-12 MED ORDER — MIDAZOLAM HCL 5 MG/5ML IJ SOLN
INTRAMUSCULAR | Status: DC | PRN
  Administered 2018-10-12: 1 mg via INTRAVENOUS

## 2018-10-12 MED ORDER — FLEET ENEMA 7-19 GM/118ML RE ENEM
1.0000 | ENEMA | Freq: Once | RECTAL | Status: AC | PRN
  Administered 2018-10-14: 1 via RECTAL

## 2018-10-12 MED ORDER — SENNOSIDES-DOCUSATE SODIUM 8.6-50 MG PO TABS
1.0000 | ORAL_TABLET | Freq: Two times a day (BID) | ORAL | Status: DC
  Administered 2018-10-12 – 2018-10-14 (×4): 1 via ORAL
  Filled 2018-10-12 (×4): qty 1

## 2018-10-12 MED ORDER — BENAZEPRIL-HYDROCHLOROTHIAZIDE 20-25 MG PO TABS: 1.0000 | ORAL_TABLET | Freq: Every day | ORAL | Status: DC

## 2018-10-12 MED ORDER — HYDROMORPHONE HCL 1 MG/ML IJ SOLN: 0.5000 mg | INTRAMUSCULAR | Status: DC | PRN

## 2018-10-12 MED ORDER — PHENOL 1.4 % MT LIQD
1.0000 | OROMUCOSAL | Status: DC | PRN
  Filled 2018-10-12: qty 177

## 2018-10-12 MED ORDER — FENTANYL CITRATE (PF) 100 MCG/2ML IJ SOLN
INTRAMUSCULAR | Status: DC | PRN
  Administered 2018-10-12 (×2): 50 ug via INTRAVENOUS

## 2018-10-12 MED ORDER — SODIUM CHLORIDE 0.9 % IV SOLN
INTRAVENOUS | Status: DC
  Administered 2018-10-12 – 2018-10-13 (×3): via INTRAVENOUS

## 2018-10-12 MED ORDER — PROPOFOL 10 MG/ML IV BOLUS
INTRAVENOUS | Status: AC
  Filled 2018-10-12: qty 20

## 2018-10-12 MED ORDER — MENTHOL 3 MG MT LOZG
1.0000 | LOZENGE | OROMUCOSAL | Status: DC | PRN
  Filled 2018-10-12: qty 9

## 2018-10-12 MED ORDER — ACETAMINOPHEN 10 MG/ML IV SOLN
INTRAVENOUS | Status: DC | PRN
  Administered 2018-10-12: 1000 mg via INTRAVENOUS

## 2018-10-12 MED ORDER — TRANEXAMIC ACID-NACL 1000-0.7 MG/100ML-% IV SOLN
1000.0000 mg | Freq: Once | INTRAVENOUS | Status: AC
  Administered 2018-10-12: 1000 mg via INTRAVENOUS
  Filled 2018-10-12: qty 100

## 2018-10-12 MED ORDER — BENAZEPRIL HCL 20 MG PO TABS
20.0000 mg | ORAL_TABLET | Freq: Every day | ORAL | Status: DC
  Filled 2018-10-12 (×3): qty 1

## 2018-10-12 MED ORDER — HYDROCHLOROTHIAZIDE 25 MG PO TABS
25.0000 mg | ORAL_TABLET | Freq: Every day | ORAL | Status: DC
  Administered 2018-10-13: 25 mg via ORAL
  Filled 2018-10-12: qty 1

## 2018-10-12 MED ORDER — DEXAMETHASONE SODIUM PHOSPHATE 10 MG/ML IJ SOLN
INTRAMUSCULAR | Status: AC
  Filled 2018-10-12: qty 1

## 2018-10-12 MED ORDER — SODIUM CHLORIDE FLUSH 0.9 % IV SOLN
INTRAVENOUS | Status: AC
  Filled 2018-10-12: qty 40

## 2018-10-12 MED ORDER — CELECOXIB 200 MG PO CAPS
400.0000 mg | ORAL_CAPSULE | Freq: Once | ORAL | Status: AC
  Administered 2018-10-12: 400 mg via ORAL

## 2018-10-12 MED ORDER — METOCLOPRAMIDE HCL 5 MG/ML IJ SOLN: 5.0000 mg | Freq: Three times a day (TID) | INTRAMUSCULAR | Status: DC | PRN

## 2018-10-12 SURGICAL SUPPLY — 68 items
BATTERY INSTRU NAVIGATION (MISCELLANEOUS) ×12 IMPLANT
BLADE SAW 70X12.5 (BLADE) ×3 IMPLANT
BLADE SAW 90X13X1.19 OSCILLAT (BLADE) ×3 IMPLANT
BLADE SAW 90X25X1.19 OSCILLAT (BLADE) ×3 IMPLANT
CANISTER SUCT 1200ML W/VALVE (MISCELLANEOUS) ×3 IMPLANT
CANISTER SUCT 3000ML PPV (MISCELLANEOUS) ×6 IMPLANT
CEMENT HV SMART SET (Cement) ×6 IMPLANT
CEMENT TIBIA MBT SIZE 4 (Knees) ×1 IMPLANT
COOLER POLAR GLACIER W/PUMP (MISCELLANEOUS) ×3 IMPLANT
COVER WAND RF STERILE (DRAPES) ×3 IMPLANT
CUFF TOURN 24 STER (MISCELLANEOUS) IMPLANT
CUFF TOURN 30 STER DUAL PORT (MISCELLANEOUS) IMPLANT
DRAPE SHEET LG 3/4 BI-LAMINATE (DRAPES) ×3 IMPLANT
DRSG DERMACEA 8X12 NADH (GAUZE/BANDAGES/DRESSINGS) ×3 IMPLANT
DRSG OPSITE POSTOP 4X14 (GAUZE/BANDAGES/DRESSINGS) ×3 IMPLANT
DRSG TEGADERM 4X4.75 (GAUZE/BANDAGES/DRESSINGS) ×3 IMPLANT
DURAPREP 26ML APPLICATOR (WOUND CARE) ×6 IMPLANT
ELECT CAUTERY BLADE 6.4 (BLADE) ×3 IMPLANT
ELECT REM PT RETURN 9FT ADLT (ELECTROSURGICAL) ×3
ELECTRODE REM PT RTRN 9FT ADLT (ELECTROSURGICAL) ×1 IMPLANT
EX-PIN ORTHOLOCK NAV 4X150 (PIN) ×6 IMPLANT
FEMUR SIGMA PS SZ 5.0 R (Femur) ×3 IMPLANT
GLOVE BIOGEL M STRL SZ7.5 (GLOVE) ×6 IMPLANT
GLOVE INDICATOR 8.0 STRL GRN (GLOVE) ×3 IMPLANT
GOWN STRL REUS W/ TWL LRG LVL3 (GOWN DISPOSABLE) ×2 IMPLANT
GOWN STRL REUS W/TWL LRG LVL3 (GOWN DISPOSABLE) ×4
HANDLE YANKAUER SUCT BULB TIP (MISCELLANEOUS) ×3 IMPLANT
HEMOVAC 400CC 10FR (MISCELLANEOUS) ×3 IMPLANT
HOLDER FOLEY CATH W/STRAP (MISCELLANEOUS) ×3 IMPLANT
HOOD PEEL AWAY FLYTE STAYCOOL (MISCELLANEOUS) ×6 IMPLANT
KIT TURNOVER KIT A (KITS) ×3 IMPLANT
KNIFE SCULPS 14X20 (INSTRUMENTS) ×3 IMPLANT
LABEL OR SOLS (LABEL) ×3 IMPLANT
MANIFOLD NEPTUNE WASTE (CANNULA) ×3 IMPLANT
NDL SAFETY ECLIPSE 18X1.5 (NEEDLE) ×1 IMPLANT
NEEDLE HYPO 18GX1.5 SHARP (NEEDLE) ×2
NEEDLE SPNL 20GX3.5 QUINCKE YW (NEEDLE) ×6 IMPLANT
NS IRRIG 500ML POUR BTL (IV SOLUTION) ×3 IMPLANT
PACK TOTAL KNEE (MISCELLANEOUS) ×3 IMPLANT
PAD WRAPON POLAR KNEE (MISCELLANEOUS) ×1 IMPLANT
PATELLA DOME PFC 41MM (Knees) ×3 IMPLANT
PENCIL SMOKE ULTRAEVAC 22 CON (MISCELLANEOUS) ×3 IMPLANT
PIN DRILL QUICK PACK ×3 IMPLANT
PIN FIXATION 1/8DIA X 3INL (PIN) ×9 IMPLANT
PLATE ROT INSERT 12.5MM (Plate) ×3 IMPLANT
PULSAVAC PLUS IRRIG FAN TIP (DISPOSABLE) ×3
SOL .9 NS 3000ML IRR  AL (IV SOLUTION) ×2
SOL .9 NS 3000ML IRR UROMATIC (IV SOLUTION) ×1 IMPLANT
SOL PREP PVP 2OZ (MISCELLANEOUS) ×3
SOLUTION PREP PVP 2OZ (MISCELLANEOUS) ×1 IMPLANT
SPONGE DRAIN TRACH 4X4 STRL 2S (GAUZE/BANDAGES/DRESSINGS) ×3 IMPLANT
SPONGE LAP 18X18 RF (DISPOSABLE) ×6 IMPLANT
STAPLER SKIN PROX 35W (STAPLE) ×3 IMPLANT
STOCKINETTE IMPERV 14X48 (MISCELLANEOUS) IMPLANT
STRAP TIBIA SHORT (MISCELLANEOUS) ×3 IMPLANT
SUCTION FRAZIER HANDLE 10FR (MISCELLANEOUS) ×2
SUCTION TUBE FRAZIER 10FR DISP (MISCELLANEOUS) ×1 IMPLANT
SUT VIC AB 0 CT1 36 (SUTURE) ×3 IMPLANT
SUT VIC AB 1 CT1 36 (SUTURE) ×6 IMPLANT
SUT VIC AB 2-0 CT2 27 (SUTURE) ×3 IMPLANT
SYR 20CC LL (SYRINGE) ×3 IMPLANT
SYR 30ML LL (SYRINGE) ×6 IMPLANT
TIBIA MBT CEMENT SIZE 4 (Knees) ×3 IMPLANT
TIP FAN IRRIG PULSAVAC PLUS (DISPOSABLE) ×1 IMPLANT
TOWEL OR 17X26 4PK STRL BLUE (TOWEL DISPOSABLE) ×3 IMPLANT
TOWER CARTRIDGE SMART MIX (DISPOSABLE) ×3 IMPLANT
TRAY FOLEY MTR SLVR 16FR STAT (SET/KITS/TRAYS/PACK) ×3 IMPLANT
WRAPON POLAR PAD KNEE (MISCELLANEOUS) ×3

## 2018-10-12 NOTE — OR Nursing (Signed)
Lab tech in for abo/rh draw [phone]

## 2018-10-12 NOTE — OR Nursing (Addendum)
Incentive Spirometry to PACU.  Patient demonstrates adequate technique with preop instruction.

## 2018-10-12 NOTE — Transfer of Care (Signed)
Immediate Anesthesia Transfer of Care Note  Patient: Hector Bennett  Procedure(s) Performed: COMPUTER ASSISTED TOTAL KNEE ARTHROPLASTY-RIGHT (Right Knee)  Patient Location: PACU  Anesthesia Type:Spinal  Level of Consciousness: drowsy  Airway & Oxygen Therapy: Patient Spontanous Breathing and Patient connected to nasal cannula oxygen  Post-op Assessment: Report given to RN and Post -op Vital signs reviewed and stable  Post vital signs: stable  Last Vitals:  Vitals Value Taken Time  BP 124/74 10/12/2018  4:03 PM  Temp    Pulse 88 10/12/2018  4:07 PM  Resp    SpO2 98 % 10/12/2018  4:07 PM  Vitals shown include unvalidated device data.  Last Pain:  Vitals:   10/12/18 0943  TempSrc: Temporal  PainSc: 0-No pain         Complications: No apparent anesthesia complications

## 2018-10-12 NOTE — Op Note (Signed)
OPERATIVE NOTE  DATE OF SURGERY:  10/12/2018  PATIENT NAME:  Hector Bennett   DOB: Dec 25, 1945  MRN: [id number]  PRE-OPERATIVE DIAGNOSIS: Degenerative arthrosis of the right knee, primary  POST-OPERATIVE DIAGNOSIS:  Same  PROCEDURE:  Right total knee arthroplasty using computer-assisted navigation  SURGEON:  Marciano Sequin. M.D.  ASSISTANT: Melissa Noon, PA-C (present and scrubbed throughout the case, critical for assistance with exposure, retraction, instrumentation, and closure)  ANESTHESIA: spinal  ESTIMATED BLOOD LOSS: 50 mL  FLUIDS REPLACED: 1000 mL of crystalloid  TOURNIQUET TIME: 111 minutes  DRAINS: 2 medium Hemovac drains  SOFT TISSUE RELEASES: Anterior cruciate ligament, posterior cruciate ligament, deep medial collateral ligament, patellofemoral ligament  IMPLANTS UTILIZED: DePuy PFC Sigma size 5 posterior stabilized femoral component (cemented), size 4 MBT tibial component (cemented), 41 mm 3 peg oval dome patella (cemented), and a 12.5 mm stabilized rotating platform polyethylene insert.  INDICATIONS FOR SURGERY: Hector Bennett is a 73 y.o. year old male with a long history of progressive knee pain. X-rays demonstrated severe degenerative changes in tricompartmental fashion. The patient had not seen any significant improvement despite conservative nonsurgical intervention. After discussion of the risks and benefits of surgical intervention, the patient expressed understanding of the risks benefits and agree with plans for total knee arthroplasty.   The risks, benefits, and alternatives were discussed at length including but not limited to the risks of infection, bleeding, nerve injury, stiffness, blood clots, the need for revision surgery, cardiopulmonary complications, among others, and they were willing to proceed.  PROCEDURE IN DETAIL: The patient was brought into the operating room and, after adequate spinal anesthesia was achieved, a tourniquet was  placed on the patient's upper thigh. The patient's knee and leg were cleaned and prepped with alcohol and DuraPrep and draped in the usual sterile fashion. A "timeout" was performed as per usual protocol. The lower extremity was exsanguinated using an Esmarch, and the tourniquet was inflated to 300 mmHg. An anterior longitudinal incision was made followed by a standard mid vastus approach. The deep fibers of the medial collateral ligament were elevated in a subperiosteal fashion off of the medial flare of the tibia so as to maintain a continuous soft tissue sleeve. The patella was subluxed laterally and the patellofemoral ligament was incised. Inspection of the knee demonstrated severe degenerative changes with full-thickness loss of articular cartilage. Osteophytes were debrided using a rongeur. Anterior and posterior cruciate ligaments were excised. Two 4.0 mm Schanz pins were inserted in the femur and into the tibia for attachment of the array of trackers used for computer-assisted navigation. Hip center was identified using a circumduction technique. Distal landmarks were mapped using the computer. The distal femur and proximal tibia were mapped using the computer. The distal femoral cutting guide was positioned using computer-assisted navigation so as to achieve a 5 distal valgus cut. The femur was sized and it was felt that a size 5 femoral component was appropriate. A size 5 femoral cutting guide was positioned and the anterior cut was performed and verified using the computer. This was followed by completion of the posterior and chamfer cuts. Femoral cutting guide for the central box was then positioned in the center box cut was performed.  Attention was then directed to the proximal tibia. Medial and lateral menisci were excised. The extramedullary tibial cutting guide was positioned using computer-assisted navigation so as to achieve a 0 varus-valgus alignment and 0 posterior slope. The cut was  performed and verified using the computer. The proximal  tibia was sized and it was felt that a size 4 tibial tray was appropriate. Tibial and femoral trials were inserted followed by insertion of a 12.5 mm polyethylene insert. This allowed for excellent mediolateral soft tissue balancing both in flexion and in full extension. Finally, the patella was cut and prepared so as to accommodate a 41 mm 3 peg oval dome patella. A patella trial was placed and the knee was placed through a range of motion with excellent patellar tracking appreciated. The femoral trial was removed after debridement of posterior osteophytes. The central post-hole for the tibial component was reamed followed by insertion of a keel punch. Tibial trials were then removed. Cut surfaces of bone were irrigated with copious amounts of normal saline with antibiotic solution using pulsatile lavage and then suctioned dry. Polymethylmethacrylate cement was prepared in the usual fashion using a vacuum mixer. Cement was applied to the cut surface of the proximal tibia as well as along the undersurface of a size 4 MBT tibial component. Tibial component was positioned and impacted into place. Excess cement was removed using [employer] fast streamer. Cement was then applied to the cut surfaces of the femur as well as along the posterior flanges of the size 5 femoral component. The femoral component was positioned and impacted into place. Excess cement was removed using [employer] fast streamer. A 12.5 mm polyethylene trial was inserted and the knee was brought into full extension with steady axial compression applied. Finally, cement was applied to the backside of a 41 mm 3 peg oval dome patella and the patellar component was positioned and patellar clamp applied. Excess cement was removed using [employer] fast streamer. After adequate curing of the cement, the tourniquet was deflated after a total tourniquet time of 111 minutes. Hemostasis was achieved using electrocautery. The knee  was irrigated with copious amounts of normal saline with antibiotic solution using pulsatile lavage and then suctioned dry. 20 mL of 1.3% Exparel and 60 mL of 0.25% Marcaine in 40 mL of normal saline was injected along the posterior capsule, medial and lateral gutters, and along the arthrotomy site. A 12.5 mm stabilized rotating platform polyethylene insert was inserted and the knee was placed through a range of motion with excellent mediolateral soft tissue balancing appreciated and excellent patellar tracking noted. 2 medium drains were placed in the wound bed and brought out through separate stab incisions. The medial parapatellar portion of the incision was reapproximated using interrupted sutures of #1 Vicryl. Subcutaneous tissue was approximated in layers using first #0 Vicryl followed #2-0 Vicryl. The skin was approximated with skin staples. A sterile dressing was applied.  The patient tolerated the procedure well and was transported to the recovery room in stable condition.    Abrianna Sidman P. Holley Bouche., M.D.

## 2018-10-12 NOTE — Anesthesia Post-op Follow-up Note (Signed)
Anesthesia QCDR form completed.        

## 2018-10-12 NOTE — Anesthesia Procedure Notes (Signed)
Spinal  Patient location during procedure: OR Start time: 10/12/2018 11:59 AM End time: 10/12/2018 12:09 PM Staffing Resident/CRNA: Bernardo Heater, CRNA Performed: resident/CRNA  Preanesthetic Checklist Completed: patient identified, site marked, surgical consent, pre-op evaluation, timeout performed, IV checked, risks and benefits discussed and monitors and equipment checked Spinal Block Patient position: sitting Prep: ChloraPrep Patient monitoring: heart rate, continuous pulse ox, blood pressure and cardiac monitor Approach: midline Location: L2-3 Injection technique: single-shot Needle Needle type: Whitacre and Introducer  Needle gauge: 22 G Needle length: 12.7 cm Additional Notes Negative paresthesia. Negative blood return. Positive free-flowing CSF. Expiration date of kit checked and confirmed. Patient tolerated procedure well, without complications.

## 2018-10-12 NOTE — H&P (Signed)
The patient has been re-examined, and the chart reviewed, and there have been no interval changes to the documented history and physical.    The risks, benefits, and alternatives have been discussed at length. The patient expressed understanding of the risks benefits and agreed with plans for surgical intervention.  James P. Hooten, Jr. M.D.    

## 2018-10-12 NOTE — Anesthesia Preprocedure Evaluation (Addendum)
Anesthesia Evaluation  Patient identified by MRN, date of birth, ID band Patient awake    Reviewed: Allergy & Precautions, H&P , NPO status , Patient's Chart, lab work & pertinent test results  Airway Mallampati: III  TM Distance: >3 FB    Comment: Large neck Dental  (+) Upper Dentures   Pulmonary sleep apnea (suspected, not diagnosed) ,           Cardiovascular hypertension, + dysrhythmias Atrial Fibrillation   H/o DVT and PE   Neuro/Psych negative neurological ROS  negative psych ROS   GI/Hepatic negative GI ROS, Neg liver ROS,   Endo/Other  Morbid obesity  Renal/GU      Musculoskeletal  (+) Arthritis ,   Abdominal   Peds  Hematology negative hematology ROS (+)   Anesthesia Other Findings Last dose of Xarelto was 10/08/18  Past Medical History: No date: A-fib [facility]) No date: Arthritis No date: DVT (deep venous thrombosis) (HCC) No date: Hyperlipidemia No date: Hypertension No date: Personal history of thrombophlebitis 11/26/2007: Pulmonary embolism (HCC)     Comment:  Bilateral  Past Surgical History: 10/20/2017: COLONOSCOPY WITH PROPOFOL; N/A     Comment:  Procedure: COLONOSCOPY WITH PROPOFOL;  Surgeon: Jonathon Bellows, MD;  Location: [facility] ENDOSCOPY;  Service:               Gastroenterology;  Laterality: N/A; 03/02/2018: COLONOSCOPY WITH PROPOFOL; N/A     Comment:  Procedure: COLONOSCOPY WITH PROPOFOL;  Surgeon: Jonathon Bellows, MD;  Location: [facility] ENDOSCOPY;  Service:               Gastroenterology;  Laterality: N/A; 11/09/2014: HAND SURGERY; Left 10/05/2018: IVC FILTER INSERTION; Right     Comment:  Procedure: IVC FILTER INSERTION;  Surgeon: Algernon Huxley,              MD;  Location: [place] CV LAB;  Service:               Cardiovascular;  Laterality: Right; No date: JOINT REPLACEMENT; Left     Comment:  knee 07/31/2015: KNEE ARTHROPLASTY; Left     Comment:  Procedure: COMPUTER  ASSISTED TOTAL KNEE ARTHROPLASTY;                Surgeon: Dereck Leep, MD;  Location: [facility] ORS;                Service: Orthopedics;  Laterality: Left; 1975: KNEE SURGERY 07/25/2015: PERIPHERAL VASCULAR CATHETERIZATION; N/A     Comment:  Procedure: IVC Filter Insertion;  Surgeon: Katha Cabal, MD;  Location: [place] CV LAB;  Service:               Cardiovascular;  Laterality: N/A; 08/29/2015: PERIPHERAL VASCULAR CATHETERIZATION; N/A     Comment:  Procedure: IVC Filter Removal;  Surgeon: Katha Cabal, MD;  Location: [place] CV LAB;  Service:               Cardiovascular;  Laterality: N/A;  BMI    Body Mass Index:  41.50 kg/m      Reproductive/Obstetrics negative OB ROS  Anesthesia Physical Anesthesia Plan  ASA: III  Anesthesia Plan: Spinal   Post-op Pain Management:    Induction:   PONV Risk Score and Plan: Propofol infusion  Airway Management Planned: Natural Airway and Simple Face Mask  Additional Equipment:   Intra-op Plan:   Post-operative Plan:   Informed Consent: I have reviewed the patients History and Physical, chart, labs and discussed the procedure including the risks, benefits and alternatives for the proposed anesthesia with the patient or authorized representative who has indicated his/her understanding and acceptance.     Dental Advisory Given  Plan Discussed with: Anesthesiologist and CRNA  Anesthesia Plan Comments:         Anesthesia Quick Evaluation

## 2018-10-13 ENCOUNTER — Encounter: Payer: Self-pay | Admitting: Orthopedic Surgery

## 2018-10-13 MED ORDER — ENOXAPARIN SODIUM 40 MG/0.4ML ~~LOC~~ SOLN: 40.0000 mg | SUBCUTANEOUS | 0 refills | Status: DC

## 2018-10-13 MED ORDER — TRAMADOL HCL 50 MG PO TABS: 50.0000 mg | ORAL_TABLET | Freq: Four times a day (QID) | ORAL | 0 refills | Status: DC | PRN

## 2018-10-13 MED ORDER — OXYCODONE HCL 5 MG PO TABS: 5.0000 mg | ORAL_TABLET | ORAL | 0 refills | Status: DC | PRN

## 2018-10-13 NOTE — Progress Notes (Signed)
   Subjective: 1 Day Post-Op Procedure(s) (LRB): COMPUTER ASSISTED TOTAL KNEE ARTHROPLASTY-RIGHT (Right) Patient reports pain as mild.   Patient is well, and has had no acute complaints or problems We will start therapy today.  Dangle leg at the side of bed during the night.  Actually was able to get up and do some walking.  Distance none recorded Plan is to go Home after hospital stay. no nausea and no vomiting Patient denies any chest pains or shortness of breath. Objective: Vital signs in last 24 hours: Temp:  [97 F (36.1 C)-98.2 F (36.8 C)] 97.9 F (36.6 C) (02/18 0623) Pulse Rate:  [57-89] 69 (02/18 0623) Resp:  [14-19] 18 (02/18 0352) BP: (98-143)/(49-87) 98/49 (02/18 0623) SpO2:  [95 %-100 %] 97 % (02/18 0623) Weight:  [138.8 kg] 138.8 kg (02/17 0943) Heels are non tender and elevated off the bed using rolled towels along with bone foam under operative heel  Intake/Output from previous day: 02/17 0701 - 02/18 0700 In: 4187.7 [I.V.:3547.8; IV Piggyback:299.8] Out: 1890 [LHTDS:2876; Drains:190; Blood:50] Intake/Output this shift: No intake/output data recorded.  No results for input(s): HGB in the last 72 hours. No results for input(s): WBC, RBC, HCT, PLT in the last 72 hours. No results for input(s): NA, K, CL, CO2, BUN, CREATININE, GLUCOSE, CALCIUM in the last 72 hours. No results for input(s): LABPT, INR in the last 72 hours.  EXAM General - Patient is Alert, Appropriate and Oriented Extremity - Neurologically intact Neurovascular intact Sensation intact distally Intact pulses distally Dorsiflexion/Plantar flexion intact Compartment soft Dressing - dressing C/D/I Motor Function - intact, moving foot and toes well on exam.  Able to do straight leg raise on his own  Past Medical History:  Diagnosis Date  . A-fib ([place])   . Arthritis   . DVT (deep venous thrombosis) ([place])   . Hyperlipidemia   . Hypertension   . Personal history of thrombophlebitis   .  Pulmonary embolism ([place]) 11/26/2007   Bilateral    Assessment/Plan: 1 Day Post-Op Procedure(s) (LRB): COMPUTER ASSISTED TOTAL KNEE ARTHROPLASTY-RIGHT (Right) Active Problems:   Total knee replacement status  Estimated body mass index is 41.5 kg/m as calculated from the following:   Height as of this encounter: 6' (1.829 m).   Weight as of this encounter: 138.8 kg. Advance diet Up with therapy D/C IV fluids Plan for discharge tomorrow Discharge home with home health  Labs: None DVT Prophylaxis - Lovenox, Foot Pumps and TED hose Weight-Bearing as tolerated to right leg D/C O2 and Pulse OX and try on Room Air Begin working on bowel movement  Jon R. [place] Casco 10/13/2018, 8:04 AM

## 2018-10-13 NOTE — Discharge Summary (Signed)
Physician Discharge Summary  Patient ID: Hector Bennett MRN: [id number] DOB/AGE: Jul 28, 1946 73 y.o.  Admit date: 10/12/2018 Discharge date: 10/14/2018  Admission Diagnoses:  PRIMARY OSTEOARTHRITIS RIGHT KNEE   Discharge Diagnoses: Patient Active Problem List   Diagnosis Date Noted  . Advanced care planning/counseling discussion 11/18/2017  . Carpal tunnel syndrome 10/20/2017  . Chronic venous insufficiency 02/04/2017  . Lymphedema 02/04/2017  . Diminished pulses in lower extremity 11/14/2016  . BPH (benign prostatic hyperplasia) 10/26/2015  . Total knee replacement status 07/31/2015  . DVT (deep venous thrombosis) ([place]) 07/11/2015  . Primary osteoarthritis of right knee 07/11/2015  . Obesity, Class III, BMI 40-49.9 (morbid obesity) ([place]) 07/11/2015  . A-fib ([place]) 04/20/2015  . Arthritis 04/20/2015  . Hyperlipidemia   . Hypertension     Past Medical History:  Diagnosis Date  . A-fib ([place])   . Arthritis   . DVT (deep venous thrombosis) ([place])   . Hyperlipidemia   . Hypertension   . Personal history of thrombophlebitis   . Pulmonary embolism ([place]) 11/26/2007   Bilateral     Transfusion: No transfusions during this admission   Consultants (if any):   Discharged Condition: Improved  Hospital Course: Hector Bennett is an 73 y.o. male who was admitted 10/12/2018 with a diagnosis of degenerative arthrosis right knee and went to the operating room on 10/12/2018 and underwent the above named procedures.    Surgeries:Procedure(s): COMPUTER ASSISTED TOTAL KNEE ARTHROPLASTY-RIGHT on 10/12/2018 PRE-OPERATIVE DIAGNOSIS: Degenerative arthrosis of the right knee, primary  POST-OPERATIVE DIAGNOSIS:  Same  PROCEDURE:  Right total knee arthroplasty using computer-assisted navigation  SURGEON:  Marciano Sequin. M.D.  ASSISTANT: Melissa Noon, PA-C (present and scrubbed throughout the case, critical for assistance with exposure, retraction, instrumentation, and  closure)  ANESTHESIA: spinal  ESTIMATED BLOOD LOSS: 50 mL  FLUIDS REPLACED: 1000 mL of crystalloid  TOURNIQUET TIME: 111 minutes  DRAINS: 2 medium Hemovac drains  SOFT TISSUE RELEASES: Anterior cruciate ligament, posterior cruciate ligament, deep medial collateral ligament, patellofemoral ligament  IMPLANTS UTILIZED: DePuy PFC Sigma size 5 posterior stabilized femoral component (cemented), size 4 MBT tibial component (cemented), 41 mm 3 peg oval dome patella (cemented), and a 12.5 mm stabilized rotating platform polyethylene insert.  INDICATIONS FOR SURGERY: Hector Bennett is a 73 y.o. year old male with a long history of progressive knee pain. X-rays demonstrated severe degenerative changes in tricompartmental fashion. The patient had not seen any significant improvement despite conservative nonsurgical intervention. After discussion of the risks and benefits of surgical intervention, the patient expressed understanding of the risks benefits and agree with plans for total knee arthroplasty.   The risks, benefits, and alternatives were discussed at length including but not limited to the risks of infection, bleeding, nerve injury, stiffness, blood clots, the need for revision surgery, cardiopulmonary complications, among others, and they were willing to proceed.  Patient tolerated the surgery well. No complications .Patient was taken to PACU where she was stabilized and then transferred to the orthopedic floor.  Patient started on Lovenox 30 mg q 12 hrs. Foot pumps applied bilaterally at 80 mm hgb. Heels elevated off bed with rolled towels. No evidence of DVT. Calves non tender. Negative [place]. Physical therapy started on day #1 for gait training and transfer with OT starting on  day #1 for ADL and assisted devices. Patient has done well with therapy. Ambulated greater than 200 feet upon being discharged.  Was able to ascend and descend 4 steps safely and independently  Patient's  IV And Foley were discontinued on day #1 with Hemovac being discontinued on day #2. Dressing was changed on day 2 prior to patient being discharged   He was given perioperative antibiotics:  Anti-infectives (From admission, onward)   Start     Dose/Rate Route Frequency Ordered Stop   10/12/18 1800  ceFAZolin (ANCEF) IVPB 2g/100 mL premix     2 g 200 mL/hr over 30 Minutes Intravenous Every 6 hours 10/12/18 1742 10/13/18 2059   10/12/18 1255  50,000 units bacitracin in 0.9% normal saline 250 mL irrigation  Status:  Discontinued       As needed 10/12/18 1255 10/12/18 1558   10/12/18 0923  ceFAZolin (ANCEF) 2-4 GM/100ML-% IVPB    Note to Pharmacy:  Phillips Grout   : cabinet override      10/12/18 0923 10/12/18 2144   10/12/18 0600  ceFAZolin (ANCEF) IVPB 2g/100 mL premix  Status:  Discontinued     2 g 200 mL/hr over 30 Minutes Intravenous On call to O.R. 10/11/18 2221 10/12/18 3614    .  He was fitted with AV 1 compression foot pump devices, instructed on heel pumps, early ambulation, and fitted with TED stockings bilaterally for DVT prophylaxis.  He benefited maximally from the hospital stay and there were no complications.    Recent vital signs:  Vitals:   10/13/18 0352 10/13/18 0623  BP: 127/80 (!) 98/49  Pulse: 79 69  Resp: 18   Temp: 97.6 F (36.4 C) 97.9 F (36.6 C)  SpO2: 95% 97%    Recent laboratory studies:  Lab Results  Component Value Date   HGB 16.0 09/29/2018   HGB 16.6 05/28/2018   HGB 15.1 11/17/2017   Lab Results  Component Value Date   WBC 7.1 09/29/2018   PLT 218 09/29/2018   Lab Results  Component Value Date   INR 1.16 09/29/2018   Lab Results  Component Value Date   NA 137 09/29/2018   K 3.6 09/29/2018   CL 105 09/29/2018   CO2 25 09/29/2018   BUN 11 09/29/2018   CREATININE 0.80 09/29/2018   GLUCOSE 98 09/29/2018    Discharge Medications:   Allergies as of 10/13/2018   No Known Allergies     Medication List    TAKE these medications    atenolol 50 MG tablet Commonly known as:  TENORMIN Take 1 tablet (50 mg total) by mouth every morning.   benazepril-hydrochlorthiazide 20-25 MG tablet Commonly known as:  LOTENSIN HCT Take 1 tablet by mouth daily.   enoxaparin 40 MG/0.4ML injection Commonly known as:  LOVENOX Inject 0.4 mLs (40 mg total) into the skin daily for 14 days. Start taking on:  October 15, 2018   hydrochlorothiazide 25 MG tablet Commonly known as:  HYDRODIURIL Take 1 tablet by mouth daily.   oxyCODONE 5 MG immediate release tablet Commonly known as:  Oxy IR/ROXICODONE Take 1 tablet (5 mg total) by mouth every 4 (four) hours as needed for moderate pain (pain score 4-6).   RA ACETAMINOPHEN 650 MG CR tablet Generic drug:  acetaminophen Take 650 mg by mouth every 8 (eight) hours as needed.   rivaroxaban 20 MG Tabs tablet Commonly known as:  XARELTO Take 1 tablet (20 mg total) by mouth daily with supper.   traMADol 50 MG tablet Commonly known as:  ULTRAM Take 1-2 tablets (50-100 mg total) by mouth every 6 (six) hours as needed for moderate pain.  Durable Medical Equipment  (From admission, onward)         Start     Ordered   10/12/18 1743  DME Walker rolling  Once    Question:  Patient needs a walker to treat with the following condition  Answer:  Total knee replacement status   10/12/18 1742   10/12/18 1743  DME Bedside commode  Once    Question:  Patient needs a bedside commode to treat with the following condition  Answer:  Total knee replacement status   10/12/18 1742          Diagnostic Studies: Dg Knee Right Port  Result Date: 10/12/2018 CLINICAL DATA:  Status post right knee replacement EXAM: PORTABLE RIGHT KNEE - 1-2 VIEW COMPARISON:  None. FINDINGS: Cutaneous staples. Surgical drain in the suprapatellar region. Status post right knee replacement with normal alignment. No fracture. Gas in the soft tissues consistent with recent surgery IMPRESSION: Status post right  knee replacement with expected operative changes Electronically Signed   By: Donavan Foil M.D.   On: 10/12/2018 16:15    Disposition:   Discharge Instructions    Increase activity slowly   Complete by:  As directed       Follow-up Information    Watt Climes, PA On 10/27/2018.   Specialty:  Physician Assistant Why:  at 1:15pm Contact information: [place] [address] [phone]        Dereck Leep, MD On 11/24/2018.   Specialty:  Orthopedic Surgery Why:  at 10:00am Contact information: [place] [address] [phone]            Signed: Watt Climes 10/13/2018, 8:10 AM

## 2018-10-13 NOTE — Progress Notes (Signed)
Physical Therapy Treatment Patient Details Name: Hector Bennett MRN: [id number] DOB: Jul 06, 1946 Today's Date: 10/13/2018    History of Present Illness Patient is a 73 year old male admitted for R TKA. PMH to include HTN, B PE, DVT, Afib, IVC filter insertion 07/25/15, HLD. Hx L TKA.    PT Comments    Patient received in the recliner, agrees to PT session. Patient able to increase gait distance this pm to 60 feet with rw and min guard assist. Requires min guard for transfers and supervision for bed mobility with use of rails as needed. Patient performing HEP independently with cues.  Patient will continue to benefit from skilled PT to address his weakness, difficulty walking and decreased activity tolerance for return home at discharge.      Follow Up Recommendations  Home health PT     Equipment Recommendations  None recommended by PT    Recommendations for Other Services       Precautions / Restrictions Precautions Precautions: Fall Restrictions Weight Bearing Restrictions: Yes RLE Weight Bearing: Weight bearing as tolerated    Mobility  Bed Mobility Overal bed mobility: Modified Independent             General bed mobility comments: performs sit to supine with bed rail assist.   Transfers Overall transfer level: Needs assistance Equipment used: Rolling walker (2 wheeled) Transfers: Sit to/from Stand Sit to Stand: Min guard            Ambulation/Gait Ambulation/Gait assistance: Min guard Gait Distance (Feet): 60 Feet Assistive device: Rolling walker (2 wheeled) Gait Pattern/deviations: Decreased weight shift to right;Step-to pattern Gait velocity: decreased       Stairs             Wheelchair Mobility    Modified Rankin (Stroke Patients Only)       Balance Overall balance assessment: Modified Independent                                          Cognition Arousal/Alertness: Awake/alert Behavior During Therapy: WFL  for tasks assessed/performed Overall Cognitive Status: Within Functional Limits for tasks assessed                                        Exercises Total Joint Exercises Ankle Circles/Pumps: AROM;10 reps;Both Quad Sets: AROM;10 reps;Both Short Arc Quad: AROM;10 reps;Supine;Right Heel Slides: AROM;10 reps;Right;Supine Hip ABduction/ADduction: AROM;10 reps;Right;Supine Straight Leg Raises: AROM;10 reps;Right;Supine Long Arc Quad: AROM;10 reps;Seated Knee Flexion: AROM;5 reps;Seated;Right Goniometric ROM: 0-85 Other Exercises Other Exercises: pt/spouse instructed in polar care mgt, compression stockings mgt, falls prevention, AE/DME for ADL, and home/routines modifications to maximize safety/independence during recovery    General Comments        Pertinent Vitals/Pain Pain Assessment: 0-10 Pain Score: 5  Pain Location: R knee Pain Descriptors / Indicators: Aching;Discomfort Pain Intervention(s): Monitored during session;Repositioned;Ice applied    Home Living Family/patient expects to be discharged to:: Private residence Living Arrangements: Spouse/significant other   Type of Home: House Home Access: Stairs to enter Entrance Stairs-Rails: None Home Layout: One level Home Equipment: Environmental consultant - 2 wheels;Bedside commode;Cane - single point;Adaptive equipment      Prior Function Level of Independence: Independent      Comments: Independent, driving, no falls.    PT Goals (current goals  can now be found in the care plan section) Acute Rehab PT Goals Patient Stated Goal: to return home PT Goal Formulation: With patient Time For Goal Achievement: 10/20/18 Potential to Achieve Goals: Good Progress towards PT goals: Progressing toward goals    Frequency    BID      PT Plan Current plan remains appropriate    Co-evaluation              AM-PAC PT "6 Clicks" Mobility   Outcome Measure  Help needed turning from your back to your side while in a  flat bed without using bedrails?: A Little Help needed moving from lying on your back to sitting on the side of a flat bed without using bedrails?: A Little Help needed moving to and from a bed to a chair (including a wheelchair)?: A Little Help needed standing up from a chair using your arms (e.g., wheelchair or bedside chair)?: A Little Help needed to walk in hospital room?: A Little Help needed climbing 3-5 steps with a railing? : A Little 6 Click Score: 18    End of Session Equipment Utilized During Treatment: Gait belt Activity Tolerance: Patient tolerated treatment well Patient left: in bed;with call bell/phone within reach;with bed alarm set;with SCD's reapplied Nurse Communication: Mobility status PT Visit Diagnosis: Muscle weakness (generalized) (M62.81);Difficulty in walking, not elsewhere classified (R26.2);Pain Pain - Right/Left: Right Pain - part of body: Knee     Time: 8182-9937 PT Time Calculation (min) (ACUTE ONLY): 24 min  Charges:  $Gait Training: 8-22 mins $Therapeutic Exercise: 8-22 mins                     Karlen Barbar, PT, GCS 10/13/18,1:49 PM

## 2018-10-13 NOTE — Care Management (Signed)
Dr. Marry Guan verified that the patient will not need Lovenox at home as he is on Xeralto, called  [facility] and cancelled the lovenox, they verified that it has been cancelled, I notified the patient that he will not need the Lovenox.

## 2018-10-13 NOTE — Care Management Note (Signed)
Case Management Note  Patient Details  Name: Hector Bennett MRN: [id number] Date of Birth: 08-Feb-1946  Subjective/Objective:                  Met with the patient to discuss DC plan and needs The patient had surgery on the other side 4 years ago He has DME at home including a RW, [facility] and Shower chair Patient used [facility] in the past and would like to use them again. [facility] list provided to the patient for choice per CMS.gov [facility] notified of choice PCP is [facility] is Ambulance person Can afford medication  Patient will provide transportation at home if needed   Action/Plan:  [facility] list provided to the patient per CMS.gov, Patient chooses [facility], notified Helene Kelp with [facility]  Expected Discharge Date:                  Expected Discharge Plan:     In-House Referral:     Discharge planning Services  CM Consult  Post Acute Care Choice:    Choice offered to:     DME Arranged:    DME Agency:     HH Arranged:  PT [place]:  [facility] [employer]  Status of Service:  In process, will continue to follow  If discussed at Long Length of Stay Meetings, dates discussed:    Additional Comments:  Su Hilt, RN 10/13/2018, 10:14 AM

## 2018-10-13 NOTE — Care Management (Signed)
Called [facility] at [phone] and called in verbal order for lovenox, requested price for the patient   price for lovenox is $ 94.12, notified the patient of the price

## 2018-10-13 NOTE — Evaluation (Signed)
Physical Therapy Evaluation Patient Details Name: Hector Bennett MRN: [id number] DOB: 03-14-1946 Today's Date: 10/13/2018   History of Present Illness  Patient is a 73 year old male admitted for R TKA. PMH to include HTN, B PE, DVT, Afib, IVC filter insertion 07/25/15, HLD  Clinical Impression  Patient received in bed. Agrees to PT evaluation. ROM 0-85 degrees. Patient reporting mod pain in right LE. Good strength demonstrated, performing bed mobility with modified independence. Transfers with min guard and bed elevated. Patient was able to walk 30 feet with rw and min guard assist. Patient will benefit from continued skilled PT to improve strength, ROM and functional independence for return home at discharge.       Follow Up Recommendations Home health PT    Equipment Recommendations  None recommended by PT    Recommendations for Other Services       Precautions / Restrictions Precautions Precautions: Fall Restrictions Weight Bearing Restrictions: Yes RLE Weight Bearing: Weight bearing as tolerated      Mobility  Bed Mobility Overal bed mobility: Modified Independent             General bed mobility comments: increased time, use of bed rails, increased effort  Transfers Overall transfer level: Needs assistance Equipment used: Rolling walker (2 wheeled) Transfers: Sit to/from Stand Sit to Stand: From elevated surface;Min assist            Ambulation/Gait Ambulation/Gait assistance: Min guard Gait Distance (Feet): 30 Feet Assistive device: Rolling walker (2 wheeled) Gait Pattern/deviations: Step-to pattern;Decreased weight shift to right;Trunk flexed;Antalgic Gait velocity: decreased      Stairs            Wheelchair Mobility    Modified Rankin (Stroke Patients Only)       Balance Overall balance assessment: Modified Independent                                           Pertinent Vitals/Pain Pain Assessment: 0-10 Pain  Score: 5  Pain Descriptors / Indicators: Operative site guarding Pain Intervention(s): Limited activity within patient's tolerance;Monitored during session;Premedicated before session;Repositioned;Ice applied    Home Living Family/patient expects to be discharged to:: Private residence Living Arrangements: Spouse/significant other   Type of Home: House Home Access: Stairs to enter Entrance Stairs-Rails: None Entrance Stairs-Number of Steps: 1 Home Layout: One level Home Equipment: Environmental consultant - 2 wheels;Bedside commode;Cane - single point      Prior Function Level of Independence: Independent               Hand Dominance        Extremity/Trunk Assessment   Upper Extremity Assessment Upper Extremity Assessment: Overall WFL for tasks assessed    Lower Extremity Assessment Lower Extremity Assessment: RLE deficits/detail RLE Deficits / Details: general weakness and pain due to surgery RLE Sensation: WNL RLE Coordination: WNL       Communication   Communication: HOH  Cognition Arousal/Alertness: Awake/alert Behavior During Therapy: WFL for tasks assessed/performed Overall Cognitive Status: Within Functional Limits for tasks assessed                                        General Comments      Exercises Total Joint Exercises Ankle Circles/Pumps: AROM;10 reps;Both;Supine Quad Sets: AROM;10 reps;Supine;Right Heel Slides: AROM;Right;10  reps;Seated Hip ABduction/ADduction: AROM;10 reps;Right;Supine Straight Leg Raises: AROM;5 reps;Supine;Right Long [employer]: AROM;10 reps;Seated Knee Flexion: AROM;5 reps;Seated;Right   Assessment/Plan    PT Assessment Patient needs continued PT services  PT Problem List Decreased strength;Decreased balance;Pain;Decreased mobility;Decreased activity tolerance       PT Treatment Interventions DME instruction;Functional mobility training;Balance training;Patient/family education;Therapeutic activities;Neuromuscular  re-education;Gait training;Stair training;Therapeutic exercise    PT Goals (Current goals can be found in the Care Plan section)  Acute Rehab PT Goals Patient Stated Goal: to return home PT Goal Formulation: With patient Time For Goal Achievement: 10/20/18 Potential to Achieve Goals: Good    Frequency BID   Barriers to discharge        Co-evaluation               AM-PAC PT "6 Clicks" Mobility  Outcome Measure Help needed turning from your back to your side while in a flat bed without using bedrails?: A Little Help needed moving from lying on your back to sitting on the side of a flat bed without using bedrails?: A Little Help needed moving to and from a bed to a chair (including a wheelchair)?: A Little Help needed standing up from a chair using your arms (e.g., wheelchair or bedside chair)?: A Little Help needed to walk in hospital room?: A Little Help needed climbing 3-5 steps with a railing? : A Little 6 Click Score: 18    End of Session Equipment Utilized During Treatment: Gait belt Activity Tolerance: Patient limited by pain Patient left: with call bell/phone within reach;in chair;with chair alarm set;with SCD's reapplied;with family/visitor present Nurse Communication: Mobility status PT Visit Diagnosis: Muscle weakness (generalized) (M62.81);Difficulty in walking, not elsewhere classified (R26.2);Pain Pain - Right/Left: Right Pain - part of body: Knee    Time: 5809-9833 PT Time Calculation (min) (ACUTE ONLY): 28 min   Charges:   PT Evaluation $PT Eval Moderate Complexity: 1 Mod PT Treatments $Gait Training: 8-22 mins        Birgitta Uhlir, PT, GCS 10/13/18,10:28 AM

## 2018-10-13 NOTE — NC FL2 (Signed)
[place] LEVEL OF CARE SCREENING TOOL     IDENTIFICATION  Patient Name: Hector Bennett Birthdate: 04-Dec-1945 Sex: male Admission Date (Current Location): 10/12/2018  [place] and [place] Number:  Engineering geologist and Address:  [facility], [address], [place], [place] [address]      Provider Number: [id number]  Attending Physician Name and Address:  Dereck Leep, MD  Relative Name and Phone Number:       Current Level of Care: Hospital Recommended Level of Care: [place] Prior Approval Number:    Date Approved/Denied:   PASRR Number: [id number]  Discharge Plan: SNF    Current Diagnoses: Patient Active Problem List   Diagnosis Date Noted  . Advanced care planning/counseling discussion 11/18/2017  . Carpal tunnel syndrome 10/20/2017  . Chronic venous insufficiency 02/04/2017  . Lymphedema 02/04/2017  . Diminished pulses in lower extremity 11/14/2016  . BPH (benign prostatic hyperplasia) 10/26/2015  . Total knee replacement status 07/31/2015  . DVT (deep venous thrombosis) ([place]) 07/11/2015  . Primary osteoarthritis of right knee 07/11/2015  . Obesity, Class III, BMI 40-49.9 (morbid obesity) ([place]) 07/11/2015  . A-fib ([place]) 04/20/2015  . Arthritis 04/20/2015  . Hyperlipidemia   . Hypertension     Orientation RESPIRATION BLADDER Height & Weight     Self, Time, Place, Situation  Normal Continent Weight: (!) 306 lb (138.8 kg) Height:  6' (182.9 cm)  BEHAVIORAL SYMPTOMS/MOOD NEUROLOGICAL BOWEL NUTRITION STATUS  (none) (none) Continent Diet(Regular )  AMBULATORY STATUS COMMUNICATION OF NEEDS Skin   Extensive Assist Verbally Surgical wounds(Right knee incision )                       Personal Care Assistance Level of Assistance  Bathing, Feeding, Dressing Bathing Assistance: Limited assistance Feeding assistance: Independent Dressing Assistance: Limited assistance     Functional  Limitations Info  Sight, Hearing, Speech Sight Info: Adequate Hearing Info: Adequate Speech Info: Adequate    SPECIAL CARE FACTORS FREQUENCY  PT (By licensed PT), OT (By licensed OT)     PT Frequency: 5 OT Frequency: 5            Contractures Contractures Info: Not present    Additional Factors Info  Code Status, Allergies Code Status Info: Full Code  Allergies Info: NKA           Current Medications (10/13/2018):  This is the current hospital active medication list Current Facility-Administered Medications  Medication Dose Route Frequency Provider Last Rate Last Dose  . 0.9 %  sodium chloride infusion   Intravenous Continuous Dereck Leep, MD 100 mL/hr at 10/13/18 0609    . acetaminophen (OFIRMEV) IV 1,000 mg  1,000 mg Intravenous Q6H Hooten, Laurice Record, MD 400 mL/hr at 10/13/18 0649 1,000 mg at 10/13/18 0649  . acetaminophen (TYLENOL) tablet 325-650 mg  325-650 mg Oral Q6H PRN Hooten, Laurice Record, MD      . alum & mag hydroxide-simeth (MAALOX/MYLANTA) 200-200-20 MG/5ML suspension 30 mL  30 mL Oral Q4H PRN Hooten, Laurice Record, MD      . atenolol (TENORMIN) tablet 50 mg  50 mg Oral Roderic Palau, MD   50 mg at 10/13/18 5277  . benazepril (LOTENSIN) tablet 20 mg  20 mg Oral Daily Hooten, Laurice Record, MD       And  . hydrochlorothiazide (HYDRODIURIL) tablet 25 mg  25 mg Oral Daily Hooten, Laurice Record, MD      .  bisacodyl (DULCOLAX) suppository 10 mg  10 mg Rectal Daily PRN Hooten, Laurice Record, MD      . ceFAZolin (ANCEF) IVPB 2g/100 mL premix  2 g Intravenous Q6H Hooten, Laurice Record, MD 200 mL/hr at 10/13/18 0307 2 g at 10/13/18 0307  . celecoxib (CELEBREX) capsule 200 mg  200 mg Oral BID Dereck Leep, MD   200 mg at 10/13/18 0851  . diphenhydrAMINE (BENADRYL) 12.5 MG/5ML elixir 12.5-25 mg  12.5-25 mg Oral Q4H PRN Hooten, Laurice Record, MD      . ferrous sulfate tablet 325 mg  325 mg Oral BID WC Hooten, Laurice Record, MD   325 mg at 10/13/18 0850  . gabapentin (NEURONTIN) capsule 300 mg  300 mg Oral  QHS Hooten, Laurice Record, MD   300 mg at 10/12/18 2207  . hydrochlorothiazide (HYDRODIURIL) tablet 25 mg  25 mg Oral Daily Hooten, Laurice Record, MD   25 mg at 10/13/18 0998  . HYDROmorphone (DILAUDID) injection 0.5-1 mg  0.5-1 mg Intravenous Q4H PRN Hooten, Laurice Record, MD      . lactated ringers infusion   Intravenous Continuous [name], MD 50 mL/hr at 10/12/18 1018    . magnesium hydroxide (MILK OF MAGNESIA) suspension 30 mL  30 mL Oral Daily Hooten, Laurice Record, MD   30 mL at 10/13/18 0852  . menthol-cetylpyridinium (CEPACOL) lozenge 3 mg  1 lozenge Oral PRN Hooten, Laurice Record, MD       Or  . phenol (CHLORASEPTIC) mouth spray 1 spray  1 spray Mouth/Throat PRN Hooten, Laurice Record, MD      . metoCLOPramide (REGLAN) tablet 5-10 mg  5-10 mg Oral Q8H PRN Hooten, Laurice Record, MD       Or  . metoCLOPramide (REGLAN) injection 5-10 mg  5-10 mg Intravenous Q8H PRN Hooten, Laurice Record, MD      . metoCLOPramide (REGLAN) tablet 10 mg  10 mg Oral TID AC & HS Hooten, Laurice Record, MD   10 mg at 10/13/18 0851  . ondansetron (ZOFRAN) tablet 4 mg  4 mg Oral Q6H PRN Hooten, Laurice Record, MD       Or  . ondansetron (ZOFRAN) injection 4 mg  4 mg Intravenous Q6H PRN Hooten, Laurice Record, MD      . oxyCODONE (Oxy IR/ROXICODONE) immediate release tablet 10 mg  10 mg Oral Q4H PRN Hooten, Laurice Record, MD      . oxyCODONE (Oxy IR/ROXICODONE) immediate release tablet 5 mg  5 mg Oral Q4H PRN Hooten, Laurice Record, MD      . pantoprazole (PROTONIX) EC tablet 40 mg  40 mg Oral BID Dereck Leep, MD   40 mg at 10/13/18 0851  . rivaroxaban (XARELTO) tablet 20 mg  20 mg Oral Q supper Hooten, Laurice Record, MD      . senna-docusate (Senokot-S) tablet 1 tablet  1 tablet Oral BID Dereck Leep, MD   1 tablet at 10/13/18 0851  . sodium phosphate (FLEET) 7-19 GM/118ML enema 1 enema  1 enema Rectal Once PRN Hooten, Laurice Record, MD      . traMADol Veatrice Bourbon) tablet 50-100 mg  50-100 mg Oral Q4H PRN Dereck Leep, MD   50 mg at 10/13/18 3382     Discharge Medications: Please  see discharge summary for a list of discharge medications.  Relevant Imaging Results:  Relevant Lab Results:   Additional Information SSN: [id number]  Annamaria Boots, [place]

## 2018-10-13 NOTE — Progress Notes (Signed)
Atenolol administration time moved to 0800 per wife's request after Nurse took vital signs early this morning. Wife states that his blood pressure will get low right after having a procedure and has done this before. Wife asked if he can take the medication Atenolol later in the morning when he eats his breakfast for that is how he takes it at home. Nurse rescheduled medication time for 0800. Patient complains of no dizziness, nor weakness or pain. Will continue to monitor patient and notified oncoming nurse.

## 2018-10-13 NOTE — Care Management (Signed)
When I notified the patient of the price of the Lovenox He and his wife state that he has a filter and takes Xarelto so he should not need the Lovenox.  I explained that I would check with the Doctor to make sure. I sent a secure inbox note to Dr. Marry Guan explaining that the patient has a filter and takes Xarelto and asked if he wanted to order the Lovenox.  Awaiting an answer

## 2018-10-13 NOTE — Evaluation (Signed)
Occupational Therapy Evaluation Patient Details Name: Hector Bennett MRN: [id number] DOB: August 28, 1945 Today's Date: 10/13/2018    History of Present Illness Patient is a 73 year old male admitted for R TKA. PMH to include HTN, B PE, DVT, Afib, IVC filter insertion 07/25/15, HLD. Hx L TKA.   Clinical Impression   Pt seen for OT evaluation this date, POD#1 from above surgery. Pt was independent in all ADLs prior to surgery. Pt is eager to return to PLOF with less pain and improved safety and independence. Pt currently requires minimal assist for LB dressing/bathing while in seated position due to pain and limited AROM of R knee. Pt/spouse instructed in polar care mgt, falls prevention strategies, home/routines modifications, DME/AE for LB bathing and dressing tasks, and compression stocking mgt. Pt/spouse verbalized understanding and report recall of information from previous L TKA.  Pt benefited maximally from OT evaluation and treatment. Pt/spouse deny additional skilled OT needs. OT in agreement. Do not currently anticipate any OT needs following this hospitalization. Will sign off. Please re-consult if additional needs arise.     Follow Up Recommendations  No OT follow up    Equipment Recommendations  None recommended by OT    Recommendations for Other Services       Precautions / Restrictions Precautions Precautions: Fall Restrictions Weight Bearing Restrictions: Yes RLE Weight Bearing: Weight bearing as tolerated      Mobility Bed Mobility             General bed mobility comments: deferred, up in recliner at start/end of session  Transfers Overall transfer level: Needs assistance Equipment used: Rolling walker (2 wheeled) Transfers: Sit to/from Stand Sit to Stand: From elevated surface;Min assist              Balance Overall balance assessment: Modified Independent                                         ADL either performed or assessed  with clinical judgement   ADL Overall ADL's : Needs assistance/impaired                                       General ADL Comments: Min A for LB ADL, Max A for compression stockings and polar care mgt; spouse verbalizes she will provide needed level of assist     Vision Baseline Vision/History: Wears glasses Wears Glasses: Reading only Patient Visual Report: No change from baseline       Perception     Praxis      Pertinent Vitals/Pain Pain Assessment: No/denies pain Pain Score: 5  Pain Descriptors / Indicators: Operative site guarding Pain Intervention(s): Limited activity within patient's tolerance;Monitored during session;Premedicated before session;Repositioned;Ice applied     Hand Dominance     Extremity/Trunk Assessment Upper Extremity Assessment Upper Extremity Assessment: Overall WFL for tasks assessed   Lower Extremity Assessment Lower Extremity Assessment: RLE deficits/detail RLE Deficits / Details: expected post-op strength/ROM deficits RLE Sensation: WNL RLE Coordination: WNL   Cervical / Trunk Assessment Cervical / Trunk Assessment: Normal   Communication Communication Communication: HOH   Cognition Arousal/Alertness: Awake/alert Behavior During Therapy: WFL for tasks assessed/performed Overall Cognitive Status: Within Functional Limits for tasks assessed  General Comments       Exercises Other Exercises Other Exercises: pt/spouse instructed in polar care mgt, compression stockings mgt, falls prevention, AE/DME for ADL, and home/routines modifications to maximize safety/independence during recovery   Shoulder Instructions      Home Living Family/patient expects to be discharged to:: Private residence Living Arrangements: Spouse/significant other   Type of Home: House Home Access: Stairs to enter Technical brewer of Steps: 1 Entrance Stairs-Rails: None Home Layout: One  level     Bathroom Shower/Tub: Occupational psychologist: Handicapped height     Home Equipment: Environmental consultant - 2 wheels;Bedside commode;Cane - single point;Adaptive equipment Adaptive Equipment: Reacher        Prior Functioning/Environment Level of Independence: Independent        Comments: Independent, driving, no falls.         OT Problem List: Decreased strength;Decreased range of motion      OT Treatment/Interventions:      OT Goals(Current goals can be found in the care plan section) Acute Rehab OT Goals Patient Stated Goal: to return home OT Goal Formulation: All assessment and education complete, DC therapy  OT Frequency:     Barriers to D/C:            Co-evaluation              AM-PAC OT "6 Clicks" Daily Activity     Outcome Measure Help from another person eating meals?: None Help from another person taking care of personal grooming?: None Help from another person toileting, which includes using toliet, bedpan, or urinal?: A Little Help from another person bathing (including washing, rinsing, drying)?: A Little Help from another person to put on and taking off regular upper body clothing?: None Help from another person to put on and taking off regular lower body clothing?: A Little 6 Click Score: 21   End of Session    Activity Tolerance: Patient tolerated treatment well Patient left: in chair;with call bell/phone within reach;with chair alarm set;with family/visitor present;with SCD's reapplied;Other (comment)(polar care in place)  OT Visit Diagnosis: Other abnormalities of gait and mobility (R26.89)                Time: 4627-0350 OT Time Calculation (min): 10 min Charges:  OT General Charges $OT Visit: 1 Visit OT Evaluation $OT Eval Low Complexity: 1 Low OT Treatments $Self Care/Home Management : 8-22 mins  Jeni Salles, MPH, MS, OTR/L ascom [phone] 10/13/18, 12:30 PM

## 2018-10-13 NOTE — Anesthesia Postprocedure Evaluation (Signed)
Anesthesia Post Note  Patient: Hector Bennett  Procedure(s) Performed: COMPUTER ASSISTED TOTAL KNEE ARTHROPLASTY-RIGHT (Right Knee)  Patient location during evaluation: PACU Anesthesia Type: Spinal and General Level of consciousness: awake and alert Pain management: pain level controlled Vital Signs Assessment: post-procedure vital signs reviewed and stable Respiratory status: spontaneous breathing, nonlabored ventilation, respiratory function stable and patient connected to nasal cannula oxygen Cardiovascular status: blood pressure returned to baseline and stable Postop Assessment: no apparent nausea or vomiting Anesthetic complications: no     Last Vitals:  Vitals:   10/13/18 0352 10/13/18 0623  BP: 127/80 (!) 98/49  Pulse: 79 69  Resp: 18   Temp: 36.4 C 36.6 C  SpO2: 95% 97%    Last Pain:  Vitals:   10/13/18 0623  TempSrc: Oral  PainSc:                  [name]

## 2018-10-14 DIAGNOSIS — Z23 Encounter for immunization: Secondary | ICD-10-CM | POA: Diagnosis not present

## 2018-10-14 MED ORDER — INFLUENZA VAC SPLIT HIGH-DOSE 0.5 ML IM SUSY
0.5000 mL | PREFILLED_SYRINGE | INTRAMUSCULAR | Status: AC
  Administered 2018-10-14: 0.5 mL via INTRAMUSCULAR
  Filled 2018-10-14: qty 0.5

## 2018-10-14 NOTE — Care Management (Signed)
Contacted Dr. Marry Guan office at [phone] and requested the order for [facility] PT and Face to face to be faxed to [facility] per [facility] request.

## 2018-10-14 NOTE — Progress Notes (Signed)
Physical Therapy Treatment Patient Details Name: Hector Bennett MRN: [id number] DOB: [birth year] Today's Date: 10/14/2018    History of Present Illness Patient is a 73 year old male admitted for R TKA. PMH to include HTN, B PE, DVT, Afib, IVC filter insertion 07/25/15, HLD. Hx L TKA.    PT Comments    Patient received sitting on side of bed with daughters present. Daughters report patient needs to have BM. Patient agrees to walk. Requires bed height raised and min assist for sit to stand. Cues for safety and sequencing with ambulation. Patient tending to pick up walker, but may be due to walker squeaking on floor.  Cued to keep walker on ground and just push it. Patient ambulated 200 feet with min guard. Cues to take his time and rest if needed. Patient will continue to benefit from skilled PT to work on improving functional independence and to improve safety with mobility. ROM 0-90 and strength in right LE good.      Follow Up Recommendations  Home health PT     Equipment Recommendations  None recommended by PT    Recommendations for Other Services       Precautions / Restrictions Precautions Precautions: Fall Restrictions Weight Bearing Restrictions: No RLE Weight Bearing: Weight bearing as tolerated    Mobility  Bed Mobility Overal bed mobility: Modified Independent             General bed mobility comments: performs sit to supine with bed rail assist.   Transfers Overall transfer level: Needs assistance Equipment used: Rolling walker (2 wheeled) Transfers: Sit to/from Stand Sit to Stand: From elevated surface;Min guard         General transfer comment: bed height raised  Ambulation/Gait Ambulation/Gait assistance: Min guard Gait Distance (Feet): 200 Feet Assistive device: Rolling walker (2 wheeled) Gait Pattern/deviations: Step-to pattern;Decreased step length - left;Decreased step length - right;Trunk flexed;Decreased weight shift to right Gait velocity:  decreased   General Gait Details: requires cues to slow down, stay close to AD, keep walker on the floor. Cued to try to walk more smoothly rather than stopping with each step.    Stairs Stairs: Yes Stairs assistance: Supervision Stair Management: Two rails;Step to pattern Number of Stairs: 4 General stair comments: cues for sequencing needed   Wheelchair Mobility    Modified Rankin (Stroke Patients Only)       Balance Overall balance assessment: Modified Independent                                          Cognition Arousal/Alertness: Awake/alert Behavior During Therapy: WFL for tasks assessed/performed Overall Cognitive Status: Within Functional Limits for tasks assessed                                        Exercises Total Joint Exercises Ankle Circles/Pumps: AROM;10 reps;Both Quad Sets: AROM;10 reps;Right Hip ABduction/ADduction: AROM;10 reps;Right Straight Leg Raises: AROM;Right;10 reps Long Arc Quad: AROM;10 reps;Right;Seated Knee Flexion: AROM;10 reps;Right;Seated Goniometric ROM: 0-90    General Comments        Pertinent Vitals/Pain Pain Assessment: 0-10 Pain Score: 5  Pain Descriptors / Indicators: Aching;Discomfort;Sore Pain Intervention(s): Limited activity within patient's tolerance;Monitored during session;Repositioned;Ice applied    Home Living  Prior Function            PT Goals (current goals can now be found in the care plan section) Acute Rehab PT Goals Patient Stated Goal: to return home PT Goal Formulation: With patient Time For Goal Achievement: 10/20/18 Potential to Achieve Goals: Good Progress towards PT goals: Progressing toward goals    Frequency    BID      PT Plan Current plan remains appropriate    Co-evaluation              AM-PAC PT "6 Clicks" Mobility   Outcome Measure  Help needed turning from your back to your side while in a flat bed  without using bedrails?: None Help needed moving from lying on your back to sitting on the side of a flat bed without using bedrails?: A Little Help needed moving to and from a bed to a chair (including a wheelchair)?: A Little Help needed standing up from a chair using your arms (e.g., wheelchair or bedside chair)?: A Little Help needed to walk in hospital room?: A Little Help needed climbing 3-5 steps with a railing? : A Little 6 Click Score: 19    End of Session Equipment Utilized During Treatment: Gait belt Activity Tolerance: Patient tolerated treatment well;Patient limited by fatigue Patient left: in bed;with nursing/sitter in room Nurse Communication: Mobility status PT Visit Diagnosis: Muscle weakness (generalized) (M62.81);Difficulty in walking, not elsewhere classified (R26.2);Pain Pain - Right/Left: Right Pain - part of body: Knee     Time: 1255-1315 PT Time Calculation (min) (ACUTE ONLY): 20 min  Charges:  $Gait Training: 8-22 mins $Therapeutic Exercise: 8-22 mins                     Catheryn Slifer, PT, GCS 10/14/18,1:25 PM

## 2018-10-14 NOTE — Progress Notes (Signed)
   Subjective: 2 Days Post-Op Procedure(s) (LRB): COMPUTER ASSISTED TOTAL KNEE ARTHROPLASTY-RIGHT (Right) Patient reports pain as 3 on 0-10 scale.   Patient is well, and has had no acute complaints or problems Patient states that he did what therapy asked him to do yesterday.  With the nurses desk couple times.  Notes indicate that he walked only 60 feet.  States that he was not asked to walk around the nurses desk.  Thinks that he could have. Plan is to go Home after hospital stay. no nausea and no vomiting Patient denies any chest pains or shortness of breath. Patient resting well and voicing no complaints  Objective: Vital signs in last 24 hours: Temp:  [97.7 F (36.5 C)-98.1 F (36.7 C)] 98.1 F (36.7 C) (02/18 2355) Pulse Rate:  [73-83] 79 (02/18 2355) Resp:  [18-19] 19 (02/18 2355) BP: (105-130)/(61-82) 105/64 (02/18 2355) SpO2:  [95 %-99 %] 95 % (02/18 2355) well approximated incision Heels are non tender and elevated off the bed using rolled towels Intake/Output from previous day: 02/18 0701 - 02/19 0700 In: 1797.5 [P.O.:600; I.V.:797.5; IV Piggyback:400] Out: 1100 [Urine:650; Drains:450] Intake/Output this shift: No intake/output data recorded.  No results for input(s): HGB in the last 72 hours. No results for input(s): WBC, RBC, HCT, PLT in the last 72 hours. No results for input(s): NA, K, CL, CO2, BUN, CREATININE, GLUCOSE, CALCIUM in the last 72 hours. No results for input(s): LABPT, INR in the last 72 hours.  EXAM General - Patient is Alert, Appropriate and Oriented Extremity - Neurologically intact Neurovascular intact Sensation intact distally Intact pulses distally Dorsiflexion/Plantar flexion intact No cellulitis present Compartment soft Dressing - dressing C/D/I Motor Function - intact, moving foot and toes well on exam.    Past Medical History:  Diagnosis Date  . A-fib ([place])   . Arthritis   . DVT (deep venous thrombosis) ([place])   . Hyperlipidemia    . Hypertension   . Personal history of thrombophlebitis   . Pulmonary embolism ([place]) 11/26/2007   Bilateral    Assessment/Plan: 2 Days Post-Op Procedure(s) (LRB): COMPUTER ASSISTED TOTAL KNEE ARTHROPLASTY-RIGHT (Right) Active Problems:   Total knee replacement status  Estimated body mass index is 41.5 kg/m as calculated from the following:   Height as of this encounter: 6' (1.829 m).   Weight as of this encounter: 138.8 kg. Up with therapy Discharge home with home health  Labs: None DVT Prophylaxis - Lovenox, Foot Pumps and TED hose Weight-Bearing as tolerated to right leg Hemovac discontinued today.  Into the drain appeared to be intact. Please wash operative leg, change dressing and apply TED stockings to both legs Patient is walking in the nurses desk and had a bowel movement prior to being discharge as well as do steps  Athanasius Kesling R. Ranshaw Opdyke 10/14/2018, 7:45 AM

## 2018-10-14 NOTE — Progress Notes (Signed)
Clinical Social Worker (CSW) received SNF consult. PT is recommending home health. RN case manager aware of above. Please reconsult if future social work needs arise. CSW signing off.   Merrill Villarruel, LCSW [phone] 

## 2018-10-14 NOTE — Progress Notes (Signed)
Physical Therapy Treatment Patient Details Name: Hector Bennett MRN: [id number] DOB: 1946-01-24 Today's Date: 10/14/2018    History of Present Illness Patient is a 73 year old male admitted for R TKA. PMH to include HTN, B PE, DVT, Afib, IVC filter insertion 07/25/15, HLD. Hx L TKA.    PT Comments    Patient received in bed, reports he slept well, moderate pain, ready for therapy. Patient continues to perform bed mobility with modified independence, use of rails. Transfers with min guard and cues for safe hand placement. Patient ambulated 225 feet with rw, min guard, cues for sequencing, posture and pace. Ambulated up.down 4 steps with B rails supervision and cues. Patient will continue to benefit from skilled PT to address his functional limitations and safety with mobility.            Follow Up Recommendations  Home health PT     Equipment Recommendations  None recommended by PT    Recommendations for Other Services       Precautions / Restrictions Precautions Precautions: Fall Restrictions Weight Bearing Restrictions: No RLE Weight Bearing: Weight bearing as tolerated    Mobility  Bed Mobility Overal bed mobility: Modified Independent             General bed mobility comments: performs sit to supine with bed rail assist.   Transfers Overall transfer level: Needs assistance Equipment used: Rolling walker (2 wheeled) Transfers: Sit to/from Stand Sit to Stand: Min guard         General transfer comment: bed height raised  Ambulation/Gait Ambulation/Gait assistance: Min guard Gait Distance (Feet): 225 Feet Assistive device: Rolling walker (2 wheeled) Gait Pattern/deviations: Step-to pattern;Decreased stance time - right;Decreased stride length;Trunk flexed;Decreased weight shift to right     General Gait Details: requires cues to slow down, stay close to AD, keep walker on the floor. Cued to try to walk more smoothly rather than stopping with each step.     Stairs Stairs: Yes Stairs assistance: Supervision Stair Management: Two rails;Step to pattern Number of Stairs: 4 General stair comments: cues for sequencing needed   Wheelchair Mobility    Modified Rankin (Stroke Patients Only)       Balance Overall balance assessment: Modified Independent                                          Cognition Arousal/Alertness: Awake/alert Behavior During Therapy: WFL for tasks assessed/performed Overall Cognitive Status: Within Functional Limits for tasks assessed                                        Exercises Total Joint Exercises Ankle Circles/Pumps: AROM;10 reps;Both Quad Sets: AROM;10 reps;Right Hip ABduction/ADduction: AROM;10 reps;Right Straight Leg Raises: AROM;Right;10 reps Long Arc Quad: AROM;10 reps;Right;Seated Knee Flexion: AROM;10 reps;Right;Seated    General Comments        Pertinent Vitals/Pain Pain Assessment: 0-10 Pain Score: 5  Pain Descriptors / Indicators: Aching;Discomfort;Sore Pain Intervention(s): Limited activity within patient's tolerance;Monitored during session;Repositioned;Ice applied    Home Living                      Prior Function            PT Goals (current goals can now be found in the care plan  section) Acute Rehab PT Goals Patient Stated Goal: to return home PT Goal Formulation: With patient Time For Goal Achievement: 10/20/18 Potential to Achieve Goals: Good Progress towards PT goals: Progressing toward goals    Frequency    BID      PT Plan Current plan remains appropriate    Co-evaluation              AM-PAC PT "6 Clicks" Mobility   Outcome Measure  Help needed turning from your back to your side while in a flat bed without using bedrails?: A Little Help needed moving from lying on your back to sitting on the side of a flat bed without using bedrails?: A Little Help needed moving to and from a bed to a chair  (including a wheelchair)?: A Little Help needed standing up from a chair using your arms (e.g., wheelchair or bedside chair)?: A Little Help needed to walk in hospital room?: A Little Help needed climbing 3-5 steps with a railing? : A Little 6 Click Score: 18    End of Session Equipment Utilized During Treatment: Gait belt Activity Tolerance: Patient limited by fatigue Patient left: in chair;with call bell/phone within reach;with family/visitor present Nurse Communication: Mobility status PT Visit Diagnosis: Muscle weakness (generalized) (M62.81);Difficulty in walking, not elsewhere classified (R26.2);Pain Pain - Right/Left: Right Pain - part of body: Knee     Time: 0855-0920 PT Time Calculation (min) (ACUTE ONLY): 25 min  Charges:  $Gait Training: 8-22 mins $Therapeutic Exercise: 8-22 mins                     Shakerria Parran, PT, GCS 10/14/18,9:45 AM

## 2018-10-14 NOTE — Progress Notes (Signed)
Leg cleaned, dressing changed, and TED applied.

## 2018-10-14 NOTE — Progress Notes (Signed)
Discharge summary reviewed with verbal understanding. Flu vaccine given. Belongings packed and given upon discharge.

## 2018-10-14 NOTE — Care Management (Signed)
Dr. Clydell Hakim office called and stated that Dr. Marry Guan is under the impression that the Case manager sets up the Home health.  I explained I did set up the [place] with [facility] however they need the Home health order in order to see the patient.  The nurse stated that she understood that but insists that Dr. Marry Guan feels that the Case manager writes the order for the home health.  I explained that is beyond my scope of practice.  I can't write an order and asked that she please have Dr. Marry Guan write the order and send to [facility].  The nurse stated that they would take care of getting the order to [facility].

## 2018-10-15 NOTE — H&P (Signed)
[facility] Admission History & Physical  MRN : [id number]  Hector Bennett is a 73 y.o. (27-Oct-1945) male who presents with chief complaint of No chief complaint on file. Marland Kitchen  History of Present Illness: Patient presents today for placement of his IVC filter.  He was seen in the office about 6 weeks ago where his orthopedic surgeon had recommended a filter be placed preliminary to his knee replacement.  He has a long history of thromboembolic issues and will be a very high risk for pulmonary embolus with his knee replacement.  His anticoagulation also has to be stopped for short period of time.  As such, IVC filter is appropriate  No current facility-administered medications for this encounter.    Current Outpatient Medications  Medication Sig Dispense Refill  . acetaminophen (RA ACETAMINOPHEN) 650 MG CR tablet Take 650 mg by mouth every 8 (eight) hours as needed.    Marland Kitchen atenolol (TENORMIN) 50 MG tablet Take 1 tablet (50 mg total) by mouth every morning. 90 tablet 4  . benazepril-hydrochlorthiazide (LOTENSIN HCT) 20-25 MG tablet Take 1 tablet by mouth daily. 90 tablet 4  . rivaroxaban (XARELTO) 20 MG TABS tablet Take 1 tablet (20 mg total) by mouth daily with supper. 90 tablet 4  . enoxaparin (LOVENOX) 40 MG/0.4ML injection Inject 0.4 mLs (40 mg total) into the skin daily for 14 days. 14 Syringe 0  . hydrochlorothiazide (HYDRODIURIL) 25 MG tablet Take 1 tablet by mouth daily.    Marland Kitchen oxyCODONE (OXY IR/ROXICODONE) 5 MG immediate release tablet Take 1 tablet (5 mg total) by mouth every 4 (four) hours as needed for moderate pain (pain score 4-6). 30 tablet 0  . traMADol (ULTRAM) 50 MG tablet Take 1-2 tablets (50-100 mg total) by mouth every 6 (six) hours as needed for moderate pain. 60 tablet 0    Past Medical History:  Diagnosis Date  . A-fib ([place])   . Arthritis   . DVT (deep venous thrombosis) ([place])   . Hyperlipidemia   . Hypertension   . Personal history of  thrombophlebitis   . Pulmonary embolism ([place]) 11/26/2007   Bilateral    Past Surgical History:  Procedure Laterality Date  . COLONOSCOPY WITH PROPOFOL N/A 10/20/2017   Procedure: COLONOSCOPY WITH PROPOFOL;  Surgeon: Jonathon Bellows, MD;  Location[facility] END[facility]COPY;  Service: Gastroenterology;  Laterality: N/A;  . COLONOSCOPY WITH PROPOFOL N/A 03/02/2018   Procedure: COLONOSCOPY WITH PROPOFOL;  Surgeon: Jonathon Bellows, MD;  Location: [facility] ENDOSCOPY;  Service: Gastroenterology;  Laterality: N/A;  . HAND SURGERY Left 11/09/2014  . IVC FILTER INSERTION Right 10/05/2018   Procedure: IVC FILTER INSERTION;  Surgeon: Algernon Huxley, MD;  Location: [facility];  Service: Cardiovascular;  Laterality: Right;  . JOINT REPLACEMENT Left    knee  . KNEE ARTHROPLASTY Left 07/31/2015   Procedure: COMPUTER ASSISTED TOTAL KNEE ARTHROPLASTY;  Surgeon: Dereck Leep, MD;  Location: [facility] ORS;  Service: Orthopedics;  Laterality: Left;  . KNEE ARTHROPLASTY Right 10/12/2018   Procedure: COMPUTER ASSISTED TOTAL KNEE ARTHROPLASTY-RIGHT;  Surgeon: Dereck Leep, MD;  Location: [facility] ORS;  Service: Orthopedics;  Laterality: Right;  . KNEE SURGERY  1975  . PERIPHERAL VASCULAR CATHETERIZATION N/A 07/25/2015   Procedure: IVC Filter Insertion;  Surgeon: Katha Cabal, MD;  Location: [facility];  Service: Cardiovascular;  Laterality: N/A;  . PERIPHERAL VASCULAR CATHETERIZATION N/A 08/29/2015   Procedure: IVC Filter Removal;  Surgeon: Katha Cabal, MD;  Location: [facility];  Service: Cardiovascular;  Laterality: N/A;    Social History Social History   Tobacco Use  . Smoking status: Never Smoker  . Smokeless tobacco: Never Used  Substance Use Topics  . Alcohol use: Yes    Alcohol/week: 7.0 standard drinks    Types: 7 Cans of beer per week  . Drug use: No    Family History Family History  Problem Relation Age of Onset  . Hypertension Mother   . Hypertension Brother   . Diabetes Brother    . Dementia Brother     No Known Allergies   REVIEW OF SYSTEMS (Negative unless checked)  Constitutional: [] Weight loss  [] Fever  [] Chills Cardiac: [] Chest pain   [] Chest pressure   [x] Palpitations   [] Shortness of breath when laying flat   [] Shortness of breath at rest   [x] Shortness of breath with exertion. Vascular:  [] Pain in legs with walking   [] Pain in legs at rest   [] Pain in legs when laying flat   [] Claudication   [] Pain in feet when walking  [] Pain in feet at rest  [] Pain in feet when laying flat   [x] History of DVT   [x] Phlebitis   [] Swelling in legs   [] Varicose veins   [] Non-healing ulcers Pulmonary:   [] Uses home oxygen   [] Productive cough   [] Hemoptysis   [] Wheeze  [] COPD   [] Asthma Neurologic:  [] Dizziness  [] Blackouts   [] Seizures   [] History of stroke   [] History of TIA  [] Aphasia   [] Temporary blindness   [] Dysphagia   [] Weakness or numbness in arms   [] Weakness or numbness in legs Musculoskeletal:  [x] Arthritis   [] Joint swelling   [x] Joint pain   [] Low back pain Hematologic:  [] Easy bruising  [] Easy bleeding   [] Hypercoagulable state   [] Anemic  [] Hepatitis Gastrointestinal:  [] Blood in stool   [] Vomiting blood  [] Gastroesophageal reflux/heartburn   [] Difficulty swallowing. Genitourinary:  [] Chronic kidney disease   [] Difficult urination  [] Frequent urination  [] Burning with urination   [] Blood in urine Skin:  [] Rashes   [] Ulcers   [] Wounds Psychological:  [] History of anxiety   []  History of major depression.  Physical Examination  Vitals:   10/05/18 0900 10/05/18 0915 10/05/18 0930 10/05/18 0940  BP: (!) 96/48 (!) 133/114 128/78 (!) 127/92  Pulse: (!) 49 (!) 53 (!) 53 (!) 51  Resp: 16 17 14 20   Temp:      TempSrc:      SpO2: 96% 96% 97% 98%  Weight:      Height:       Body mass index is 41.5 kg/m. Gen: WD/WN, NAD.  Morbidly obese Head: [place]/AT, No temporalis wasting Ear/Nose/Throat: Hearing grossly intact, nares w/o erythema or drainage, oropharynx w/o  Erythema/Exudate,  Eyes: Conjunctiva clear, sclera non-icteric Neck: Trachea midline.  No JVD.  Pulmonary:  Good air movement, respirations not labored, no use of accessory muscles.  Cardiac: RRR, normal S1, S2. Vascular:  Vessel Right Left  Radial Palpable Palpable   Musculoskeletal: M/S 5/5 throughout.  Extremities without ischemic changes.  No deformity or atrophy.  Neurologic: Sensation grossly intact in extremities.  Symmetrical.  Speech is fluent. Motor exam as listed above. Psychiatric: Judgment intact, Mood & affect appropriate for pt's clinical situation. Dermatologic: No rashes or ulcers noted.  No cellulitis or open wounds.      CBC Lab Results  Component Value Date   WBC 7.1 09/29/2018   HGB 16.0 09/29/2018   HCT 48.3 09/29/2018   MCV 95.3 09/29/2018  PLT 218 09/29/2018    BMET    Component Value Date/Time   NA 137 09/29/2018 1426   NA 141 05/28/2018 1629   K 3.6 09/29/2018 1426   CL 105 09/29/2018 1426   CO2 25 09/29/2018 1426   GLUCOSE 98 09/29/2018 1426   BUN 11 09/29/2018 1426   BUN 12 05/28/2018 1629   CREATININE 0.80 09/29/2018 1426   CALCIUM 9.0 09/29/2018 1426   GFRNONAA >60 09/29/2018 1426   GFRAA >60 09/29/2018 1426   Estimated Creatinine Clearance: 117 mL/min (by C-G formula based on SCr of 0.8 mg/dL).  COAG Lab Results  Component Value Date   INR 1.16 09/29/2018   INR 1.66 07/19/2015    Radiology Dg Knee Right Port  Result Date: 10/12/2018 CLINICAL DATA:  Status post right knee replacement EXAM: PORTABLE RIGHT KNEE - 1-2 VIEW COMPARISON:  None. FINDINGS: Cutaneous staples. Surgical drain in the suprapatellar region. Status post right knee replacement with normal alignment. No fracture. Gas in the soft tissues consistent with recent surgery IMPRESSION: Status post right knee replacement with expected operative changes Electronically Signed   By: Donavan Foil M.D.   On: 10/12/2018 16:15     Assessment/Plan 1.  DVT and PE with  upcoming joint replacement making him very high risk for combo embolic complications.  As such, IVC filter will be placed today.  We will see him back in 6 to 8 weeks and plan removal in 2 to 3 months. 2.  Osteoarthritis right knee.  Knee replacement next week. 3.  Hypertension.  Stable on outpatient medications and will monitor blood pressure throughout the procedure   Leotis Pain, MD  10/15/2018 10:57 AM

## 2018-10-27 DIAGNOSIS — M25661 Stiffness of right knee, not elsewhere classified: Secondary | ICD-10-CM | POA: Diagnosis not present

## 2018-10-27 DIAGNOSIS — R29898 Other symptoms and signs involving the musculoskeletal system: Secondary | ICD-10-CM | POA: Diagnosis not present

## 2018-10-27 DIAGNOSIS — Z96651 Presence of right artificial knee joint: Secondary | ICD-10-CM | POA: Diagnosis not present

## 2018-10-27 DIAGNOSIS — M25561 Pain in right knee: Secondary | ICD-10-CM | POA: Diagnosis not present

## 2018-10-30 DIAGNOSIS — M25561 Pain in right knee: Secondary | ICD-10-CM | POA: Diagnosis not present

## 2018-10-30 DIAGNOSIS — Z96651 Presence of right artificial knee joint: Secondary | ICD-10-CM | POA: Diagnosis not present

## 2018-11-02 DIAGNOSIS — Z96651 Presence of right artificial knee joint: Secondary | ICD-10-CM | POA: Diagnosis not present

## 2018-11-03 ENCOUNTER — Other Ambulatory Visit: Payer: Self-pay | Admitting: Unknown Physician Specialty

## 2018-11-03 DIAGNOSIS — I4819 Other persistent atrial fibrillation: Secondary | ICD-10-CM

## 2018-11-03 NOTE — Telephone Encounter (Signed)
Requested Prescriptions  Pending Prescriptions Disp Refills  . XARELTO 20 MG TABS tablet [Pharmacy Med Name: XARELTO 20 MG TABLET] 90 tablet 0    Sig: Take 1 tablet (20 mg total) by mouth daily with supper.     Hematology: Anticoagulants - rivaroxaban Passed - 11/03/2018 10:59 AM      Passed - ALT in normal range and within 180 days    ALT  Date Value Ref Range Status  09/29/2018 15 0 - 44 U/L Final   ALT (SGPT) Piccolo, Waived  Date Value Ref Range Status  05/28/2018 28 10 - 47 U/L Final         Passed - AST in normal range and within 180 days    AST  Date Value Ref Range Status  09/29/2018 19 15 - 41 U/L Final   AST (SGOT) Piccolo, Waived  Date Value Ref Range Status  05/28/2018 21 11 - 38 U/L Final         Passed - Cr in normal range and within 360 days    Creatinine, Ser  Date Value Ref Range Status  09/29/2018 0.80 0.61 - 1.24 mg/dL Final         Passed - HCT in normal range and within 360 days    HCT  Date Value Ref Range Status  09/29/2018 48.3 39.0 - 52.0 % Final   Hematocrit  Date Value Ref Range Status  05/28/2018 48.6 37.5 - 51.0 % Final         Passed - HGB in normal range and within 360 days    Hemoglobin  Date Value Ref Range Status  09/29/2018 16.0 13.0 - 17.0 g/dL Final  05/28/2018 16.6 13.0 - 17.7 g/dL Final         Passed - PLT in normal range and within 360 days    Platelets  Date Value Ref Range Status  09/29/2018 218 150 - 400 K/uL Final  05/28/2018 201 150 - 450 x10E3/uL Final         Passed - Valid encounter within last 12 months    Recent Outpatient Visits          5 months ago Essential hypertension   [place], Jeannette How, MD   11 months ago Advanced care planning/counseling discussion   [facility], Jeannette How, MD   1 year ago Essential hypertension   [facility] Cr[facility], Jeannette How, MD   1 year ago Annual physical exam   [facility] Cr[facility], Jeannette How, MD   2 years  ago Essential hypertension   [place], Jeannette How, MD      Future Appointments            In 1 month Criss[place], Jeannette How, MD [facility]

## 2018-11-04 DIAGNOSIS — Z96651 Presence of right artificial knee joint: Secondary | ICD-10-CM | POA: Diagnosis not present

## 2018-11-04 DIAGNOSIS — M25561 Pain in right knee: Secondary | ICD-10-CM | POA: Diagnosis not present

## 2018-11-06 DIAGNOSIS — M25561 Pain in right knee: Secondary | ICD-10-CM | POA: Diagnosis not present

## 2018-11-06 DIAGNOSIS — Z96651 Presence of right artificial knee joint: Secondary | ICD-10-CM | POA: Diagnosis not present

## 2018-11-09 DIAGNOSIS — Z96651 Presence of right artificial knee joint: Secondary | ICD-10-CM | POA: Diagnosis not present

## 2018-11-09 DIAGNOSIS — M25561 Pain in right knee: Secondary | ICD-10-CM | POA: Diagnosis not present

## 2018-11-11 DIAGNOSIS — M25561 Pain in right knee: Secondary | ICD-10-CM | POA: Diagnosis not present

## 2018-11-11 DIAGNOSIS — Z96651 Presence of right artificial knee joint: Secondary | ICD-10-CM | POA: Diagnosis not present

## 2018-11-13 DIAGNOSIS — Z96651 Presence of right artificial knee joint: Secondary | ICD-10-CM | POA: Diagnosis not present

## 2018-11-13 DIAGNOSIS — M25561 Pain in right knee: Secondary | ICD-10-CM | POA: Diagnosis not present

## 2018-11-16 DIAGNOSIS — Z96651 Presence of right artificial knee joint: Secondary | ICD-10-CM | POA: Diagnosis not present

## 2018-11-18 DIAGNOSIS — M25561 Pain in right knee: Secondary | ICD-10-CM | POA: Diagnosis not present

## 2018-11-18 DIAGNOSIS — Z96651 Presence of right artificial knee joint: Secondary | ICD-10-CM | POA: Diagnosis not present

## 2018-11-19 ENCOUNTER — Ambulatory Visit: Payer: Self-pay

## 2018-11-20 ENCOUNTER — Ambulatory Visit: Payer: Self-pay

## 2018-11-24 DIAGNOSIS — M1711 Unilateral primary osteoarthritis, right knee: Secondary | ICD-10-CM | POA: Diagnosis not present

## 2018-12-01 ENCOUNTER — Other Ambulatory Visit (INDEPENDENT_AMBULATORY_CARE_PROVIDER_SITE_OTHER): Payer: Self-pay | Admitting: Vascular Surgery

## 2018-12-01 ENCOUNTER — Ambulatory Visit (INDEPENDENT_AMBULATORY_CARE_PROVIDER_SITE_OTHER): Payer: Medicare Other | Admitting: Vascular Surgery

## 2018-12-01 ENCOUNTER — Other Ambulatory Visit: Payer: Self-pay

## 2018-12-01 ENCOUNTER — Ambulatory Visit (INDEPENDENT_AMBULATORY_CARE_PROVIDER_SITE_OTHER): Payer: Medicare Other

## 2018-12-01 ENCOUNTER — Encounter (INDEPENDENT_AMBULATORY_CARE_PROVIDER_SITE_OTHER): Payer: Self-pay | Admitting: Vascular Surgery

## 2018-12-01 VITALS — BP 136/84 | HR 52 | Resp 16 | Ht 72.0 in | Wt 297.0 lb

## 2018-12-01 DIAGNOSIS — Z86711 Personal history of pulmonary embolism: Secondary | ICD-10-CM

## 2018-12-01 DIAGNOSIS — I10 Essential (primary) hypertension: Secondary | ICD-10-CM

## 2018-12-01 DIAGNOSIS — I825Y1 Chronic embolism and thrombosis of unspecified deep veins of right proximal lower extremity: Secondary | ICD-10-CM

## 2018-12-01 DIAGNOSIS — Z79899 Other long term (current) drug therapy: Secondary | ICD-10-CM

## 2018-12-01 DIAGNOSIS — Z95828 Presence of other vascular implants and grafts: Secondary | ICD-10-CM

## 2018-12-01 DIAGNOSIS — Z86718 Personal history of other venous thrombosis and embolism: Secondary | ICD-10-CM | POA: Diagnosis not present

## 2018-12-01 DIAGNOSIS — Z7901 Long term (current) use of anticoagulants: Secondary | ICD-10-CM

## 2018-12-01 DIAGNOSIS — I4891 Unspecified atrial fibrillation: Secondary | ICD-10-CM | POA: Diagnosis not present

## 2018-12-01 DIAGNOSIS — E785 Hyperlipidemia, unspecified: Secondary | ICD-10-CM

## 2018-12-01 NOTE — Progress Notes (Signed)
MRN : [id number]  Hector Bennett is a 73 y.o. (03-10-1946) male who presents with chief complaint of  Chief Complaint  Patient presents with  . Follow-up    [facility] dvt u/s follow up  .  History of Present Illness: Patient returns today in follow up of his DVT and IVC filter placement.  He underwent knee replacement about 6 to 8 weeks ago and has healed well from that.  His mobility is back to his baseline.  His swelling is under reasonably good control with activity, elevation, and a compression stocking on the right leg.  No new ulcerations or infections.  The patient's duplex today shows chronic appearing DVT in the right leg which is expected from his old DVT.  No acute DVT is seen in either lower extremity.  Current Outpatient Medications  Medication Sig Dispense Refill  . acetaminophen (RA ACETAMINOPHEN) 650 MG CR tablet Take 650 mg by mouth every 8 (eight) hours as needed.    Marland Kitchen atenolol (TENORMIN) 50 MG tablet Take 1 tablet (50 mg total) by mouth every morning. 90 tablet 4  . benazepril-hydrochlorthiazide (LOTENSIN HCT) 20-25 MG tablet Take 1 tablet by mouth daily. 90 tablet 4  . hydrochlorothiazide (HYDRODIURIL) 25 MG tablet Take 1 tablet by mouth daily.    Marland Kitchen oxyCODONE (OXY IR/ROXICODONE) 5 MG immediate release tablet Take 1 tablet (5 mg total) by mouth every 4 (four) hours as needed for moderate pain (pain score 4-6). 30 tablet 0  . traMADol (ULTRAM) 50 MG tablet Take 1-2 tablets (50-100 mg total) by mouth every 6 (six) hours as needed for moderate pain. 60 tablet 0  . XARELTO 20 MG TABS tablet Take 1 tablet (20 mg total) by mouth daily with supper. 90 tablet 0  . enoxaparin (LOVENOX) 40 MG/0.4ML injection Inject 0.4 mLs (40 mg total) into the skin daily for 14 days. 14 Syringe 0   No current facility-administered medications for this visit.     Past Medical History:  Diagnosis Date  . A-fib ([place])   . Arthritis   . DVT (deep venous thrombosis) ([place])   . Hyperlipidemia   .  Hypertension   . Personal history of thrombophlebitis   . Pulmonary embolism ([place]) 11/26/2007   Bilateral    Past Surgical History:  Procedure Laterality Date  . COLONOSCOPY WITH PROPOFOL N/A 10/20/2017   Procedure: COLONOSCOPY WITH PROPOFOL;  Surgeon: Jonathon Bellows, MD;  Location: [facility] ENDOSCOPY;  Service: Gastroenterology;  Laterality: N/A;  . COLONOSCOPY WITH PROPOFOL N/A 03/02/2018   Procedure: COLONOSCOPY WITH PROPOFOL;  Surgeon: Jonathon Bellows, MD;  Location: [facility] ENDOSCOPY;  Service: Gastroenterology;  Laterality: N/A;  . HAND SURGERY Left 11/09/2014  . IVC FILTER INSERTION Right 10/05/2018   Procedure: IVC FILTER INSERTION;  Surgeon: Algernon Huxley, MD;  Location: [facility] [facility] LAB;  Service: Cardiovascular;  Laterality: Right;  . JOINT REPLACEMENT Left    knee  . KNEE ARTHROPLASTY Left 07/31/2015   Procedure: COMPUTER ASSISTED TOTAL KNEE ARTHROPLASTY;  Surgeon: Dereck Leep, MD;  Location: [facility] ORS;  Service: Orthopedics;  Laterality: Left;  . KNEE ARTHROPLASTY Right 10/12/2018   Procedure: COMPUTER ASSISTED TOTAL KNEE ARTHROPLASTY-RIGHT;  Surgeon: Dereck Leep, MD;  Location: [facility] ORS;  Service: Orthopedics;  Laterality: Right;  . KNEE SURGERY  1975  . PERIPHERAL VASCULAR CATHETERIZATION N/A 07/25/2015   Procedure: IVC Filter Insertion;  Surgeon: Katha Cabal, MD;  Location: [place] [facility];  Service: Cardiovascular;  Laterality: N/A;  . PERIPHERAL VASCULAR  CATHETERIZATION N/A 08/29/2015   Procedure: IVC Filter Removal;  Surgeon: Katha Cabal, MD;  Location: [place] [facility];  Service: Cardiovascular;  Laterality: N/A;   Family History  Problem Relation Age of Onset  . Hypertension Mother   . Hypertension Brother   . Diabetes Brother   . Dementia Brother   No aneurysms  Social History Social History        Tobacco Use  . Smoking status: Never Smoker  . Smokeless tobacco: Never Used  Substance Use Topics  . Alcohol use: Yes    Alcohol/week: 7.0  standard drinks    Types: 7 Cans of beer per week  . Drug use: No  Married  No Known Allergies      REVIEW OF SYSTEMS (Negative unless checked)  Constitutional: [] ?Weight loss  [] ?Fever  [] ?Chills Cardiac: [] ?Chest pain   [] ?Chest pressure   [] ?Palpitations   [] ?Shortness of breath when laying flat   [] ?Shortness of breath at rest   [] ?Shortness of breath with exertion. Vascular:  [] ?Pain in legs with walking   [] ?Pain in legs at rest   [] ?Pain in legs when laying flat   [] ?Claudication   [] ?Pain in feet when walking  [] ?Pain in feet at rest  [] ?Pain in feet when laying flat   [x] ?History of DVT   [x] ?Phlebitis   [x] ?Swelling in legs   [] ?Varicose veins   [] ?Non-healing ulcers Pulmonary:   [] ?Uses home oxygen   [] ?Productive cough   [] ?Hemoptysis   [] ?Wheeze  [] ?COPD   [] ?Asthma Neurologic:  [] ?Dizziness  [] ?Blackouts   [] ?Seizures   [] ?History of stroke   [] ?History of TIA  [] ?Aphasia   [] ?Temporary blindness   [] ?Dysphagia   [] ?Weakness or numbness in arms   [] ?Weakness or numbness in legs Musculoskeletal:  [x] ?Arthritis   [] ?Joint swelling   [x] ?Joint pain   [] ?Low back pain Hematologic:  [] ?Easy bruising  [] ?Easy bleeding   [] ?Hypercoagulable state   [] ?Anemic  [] ?Hepatitis  Gastrointestinal:  [] ?Blood in stool   [] ?Vomiting blood  [] ?Gastroesophageal reflux/heartburn   [] ?Difficulty swallowing   Genitourinary:  [] ?Chronic kidney disease   [] ?Difficult urination  [] ?Frequent urination  [] ?Burning with urination   [] ?Blood in urine Skin:  [] ?Rashes   [] ?Ulcers   [] ?Wounds Psychological:  [] ?History of anxiety   [] ? History of major depression.    Physical Examination  BP 136/84 (BP Location: Right Arm)   Pulse (!) 52   Resp 16   Ht 6' (1.829 m)   Wt 297 lb (134.7 kg)   BMI 40.28 kg/m  Gen:  WD/WN, NAD Head: [place]/AT, No temporalis wasting. Ear/Nose/Throat: Hearing grossly intact, nares w/o erythema or drainage Eyes: Conjunctiva clear. Sclera non-icteric Neck: Supple.   Trachea midline Pulmonary:  Good air movement, no use of accessory muscles.  Cardiac: RRR, no JVD Vascular:  Vessel Right Left  Radial Palpable Palpable                          PT Palpable Palpable  DP Palpable Palpable   Gastrointestinal: soft, non-tender/non-distended. No guarding/reflex.  Musculoskeletal: M/S 5/5 throughout.  No deformity or atrophy.  1+ right lower extremity edema. Neurologic: Sensation grossly intact in extremities.  Symmetrical.  Speech is fluent.  Psychiatric: Judgment intact, Mood & affect appropriate for pt's clinical situation. Dermatologic: No rashes or ulcers noted.  No cellulitis or open wounds.       Labs Recent Results (from the past 2160 hour(s))  C-reactive protein  Status: None   Collection Time: 09/29/18  2:26 PM  Result Value Ref Range   CRP <0.8 <1.0 mg/dL    Comment: Performed at [facility], [place] [address] [place], [place] [address]  Sedimentation rate     Status: None   Collection Time: 09/29/18  2:26 PM  Result Value Ref Range   Sed Rate 1 0 - 20 mm/hr    Comment: Performed at [facility], [address] [place], [place] [address]  Surgical pcr screen     Status: None   Collection Time: 09/29/18  2:26 PM  Result Value Ref Range   MRSA, PCR NEGATIVE NEGATIVE   Staphylococcus aureus NEGATIVE NEGATIVE    Comment: (NOTE) The Xpert SA Assay (FDA approved for NASAL specimens in patients 68 years of age and older), is one component of a comprehensive surveillance program. It is not intended to diagnose infection nor to guide or monitor treatment. Performed at [facility], [address] [place], [place] [address]   APTT     Status: None   Collection Time: 09/29/18  2:26 PM  Result Value Ref Range   aPTT 33 24 - 36 seconds    Comment: Performed at [facility], [address] [place], [place] [address]  CBC     Status: None   Collection Time: 09/29/18  2:26 PM  Result Value Ref  Range   WBC 7.1 4.0 - 10.5 K/uL   RBC 5.07 4.22 - 5.81 MIL/uL   Hemoglobin 16.0 13.0 - 17.0 g/dL   HCT 48.3 39.0 - 52.0 %   MCV 95.3 80.0 - 100.0 fL   MCH 31.6 26.0 - 34.0 pg   MCHC 33.1 30.0 - 36.0 g/dL   RDW 13.5 11.5 - 15.5 %   Platelets 218 150 - 400 K/uL   nRBC 0.0 0.0 - 0.2 %    Comment: Performed at [facility], [address] [place], [place] [address]  Comprehensive metabolic panel     Status: None   Collection Time: 09/29/18  2:26 PM  Result Value Ref Range   Sodium 137 135 - 145 mmol/L   Potassium 3.6 3.5 - 5.1 mmol/L   Chloride 105 98 - 111 mmol/L   CO2 25 22 - 32 mmol/L   Glucose, Bld 98 70 - 99 mg/dL   BUN 11 8 - 23 mg/dL   Creatinine, Ser 0.80 0.61 - 1.24 mg/dL   Calcium 9.0 8.9 - 10.3 mg/dL   Total Protein 7.2 6.5 - 8.1 g/dL   Albumin 3.9 3.5 - 5.0 g/dL   AST 19 15 - 41 U/L   ALT 15 0 - 44 U/L   Alkaline Phosphatase 55 38 - 126 U/L   Total Bilirubin 1.2 0.3 - 1.2 mg/dL   GFR calc non Af Amer >60 >60 mL/min   GFR calc Af Amer >60 >60 mL/min   Anion gap 7 5 - 15    Comment: Performed at [facility], [address], [place], [place] [address]  Protime-INR     Status: None   Collection Time: 09/29/18  2:26 PM  Result Value Ref Range   Prothrombin Time 14.7 11.4 - 15.2 seconds   INR 1.16     Comment: Performed at [facility], [address] [place], [place] [address]  Type and screen Order type and screen if day of surgery is less than 15 days from draw of preadmission visit or order morning of surgery  if day of surgery is greater than 6 days from preadmission visit.     Status: None   Collection Time: 09/29/18  2:26 PM  Result Value Ref Range   ABO/RH(D) A NEG    Antibody Screen NEG    Sample Expiration 10/13/2018    Extend sample reason      NO TRANSFUSIONS OR PREGNANCY IN THE PAST 3 MONTHS Performed at [facility], [address] [place], [place] [address]   Urinalysis, Routine w reflex microscopic      Status: Abnormal   Collection Time: 09/29/18  2:26 PM  Result Value Ref Range   Color, Urine YELLOW (A) YELLOW   APPearance CLEAR (A) CLEAR   Specific Gravity, Urine 1.014 1.005 - 1.030   pH 5.0 5.0 - 8.0   Glucose, UA NEGATIVE NEGATIVE mg/dL   Hgb urine dipstick NEGATIVE NEGATIVE   Bilirubin Urine NEGATIVE NEGATIVE   Ketones, ur NEGATIVE NEGATIVE mg/dL   Protein, ur NEGATIVE NEGATIVE mg/dL   Nitrite NEGATIVE NEGATIVE   Leukocytes, UA NEGATIVE NEGATIVE    Comment: Performed at [facility], [address] [place], [place] [address]  Urine culture     Status: None   Collection Time: 09/29/18  2:26 PM  Result Value Ref Range   Specimen Description      URINE, CLEAN CATCH Performed at [facility], [address], [place], [place] [address]    Special Requests      Normal Performed at [facility], [address], [place], [place] [address]    Culture      NO GROWTH Performed at [facility], [place] [address] [place], [place] [address]    Report Status 09/30/2018 FINAL   Type and screen [place]     Status: None   Collection Time: 10/12/18  9:40 AM  Result Value Ref Range   ABO/RH(D) A NEG    Antibody Screen NEG    Sample Expiration      10/15/2018 Performed at [facility], [address], [place], [place] [address]     Radiology No results found.  Assessment/Plan Hyperlipidemia lipid control important in reducing the progression of atherosclerotic disease.  Not currently taking a statin   A-fib On anticoagulation and rate controlled  Hypertension blood pressure control important in reducing the progression of atherosclerotic disease. On appropriate oral medications.  S/P IVC filter At this point, he has completed his knee replacement and has recovered well.  He does not have any acute DVT and his filter can be removed at any time.  Unfortunately, COVID-19 has put a restriction on  performing elective procedures so we may have to delay this for several weeks.  Given the nature of IVC filter placements and retrievals, I hope this does not lead to an inability to retrieve his filter as they become harder to retrieve the longer they are delayed.Marland Kitchen  He voices his understanding.  DVT (deep venous thrombosis) (HCC) The patient's duplex today shows chronic appearing DVT in the right leg which is expected from his old DVT.  No acute DVT is seen in either lower extremity. Postphlebitic symptoms are well controlled.  His filter can be retrieved but as listed above elective procedures have been put on hold because of COVID-19 so this will be delayed several weeks.    Leotis Pain, MD  12/01/2018 12:34 PM    This note was created with Dragon medical transcription system.  Any errors from dictation  are purely unintentional

## 2018-12-01 NOTE — Assessment & Plan Note (Signed)
At this point, he has completed his knee replacement and has recovered well.  He does not have any acute DVT and his filter can be removed at any time.  Unfortunately, COVID-19 has put a restriction on performing elective procedures so we may have to delay this for several weeks.  Given the nature of IVC filter placements and retrievals, I hope this does not lead to an inability to retrieve his filter as they become harder to retrieve the longer they are delayed.Marland Kitchen  He voices his understanding.

## 2018-12-01 NOTE — Patient Instructions (Signed)
Inferior Vena Cava Filter Removal  Inferior vena cava filter removal is a procedure to take out a metal filter that was placed into a large vein in the abdomen (inferior vena cava, IVC). An IVC filter prevents blood clots in the legs or pelvis from traveling to the heart or lungs. Some IVC filters are designed to be removed (retrievable filters). You may have your filter removed when the danger of forming blood clots has passed or when you can take blood-thinning medicine to prevent blood clots. In some cases, the filter may need to be removed because it becomes damaged, is not working, or is causing problems. Most filters can be removed through the vein (percutaneous). In the rare cases when a surgeon is unable to remove the filter percutaneously, one of these steps may be taken:  A more invasive, open surgery may be necessary.  The filter may be left in place. Tell a health care provider about:  Any allergies you have.  All medicines you are taking, including vitamins, herbs, eye drops, creams, and over-the-counter medicines.  Any problems you or family members have had with anesthetic medicines or with contrast dyes that are used during an imaging test.  Any blood disorders you have.  Any surgeries you have had.  Any medical conditions you have.  Whether you are pregnant or may be pregnant. What are the risks? Generally, this is a safe procedure. However, problems may occur, including:  Infection.  Bleeding.  Allergic reactions to medicines or dyes.  Damage to the IVC, other blood vessels, or surrounding structures.  A blood clot or a piece of the filter breaking loose and traveling to the heart or lungs. What happens before the procedure? Medicines Ask your health care provider about:  Changing or stopping your regular medicines. This is especially important if you are taking diabetes medicines or blood thinners.  Taking medicines such as aspirin and ibuprofen. These  medicines can thin your blood. Do not take these medicines unless your health care provider tells you to take them.  Taking over-the-counter medicines, vitamins, herbs, and supplements. Staying hydrated Follow instructions from your health care provider about hydration, which may include:  Up to 2 hours before the procedure - you may continue to drink clear liquids, such as water or clear fruit juice. Eating and drinking restrictions Follow instructions from your health care provider about eating and drinking, which may include:  8 hours before the procedure - stop eating heavy meals or foods such as meat, fried foods, or fatty foods.  6 hours before the procedure - stop eating light meals or foods, such as toast or cereal.  6 hours before the procedure - stop drinking milk or drinks that contain milk.  2 hours before the procedure - stop drinking clear liquids. General instructions  Plan to have someone take you home from the hospital or clinic.  Plan to have a responsible adult care for you for at least 24 hours after you leave the hospital or clinic. This is important. What happens during the procedure?  To lower your risk of infection: ? Your health care team will wash or sanitize their hands. ? Hair may be removed from the surgical area. ? Your skin will be washed with soap.  An IV will be inserted into one of your veins.  You will be given one or more of the following: ? A medicine to help you relax (sedative). ? A medicine to make you fall asleep (general anesthetic).  The   procedure will be done through a vein in your groin or neck that leads to the IVC. Your health care provider will inject a numbing medicine (local anesthetic) into the skin over the vein that will be used.  A small incision will be made over the vein.  A long, thin tube (catheter) will be inserted into the vein.  The catheter will be moved through your vein and into your IVC. X-rays may be done to  help guide the catheter into place. Dye may be injected through the catheter before the X-rays to make the catheter and filter easier to see.  When the catheter reaches the filter, a hook (snare) on the end of the catheter may be used to latch onto the filter. In some cases, a grasping instrument (forceps) may be threaded through the catheter to gently grab and remove the filter instead.  After the filter has been hooked or grasped, the filter and instruments will be pulled out through the catheter.  The catheter will be removed through the incision in your skin.  Pressure will be placed over your incision until bleeding stops.  A bandage (dressing) will be placed over your incision. The procedure may vary among health care providers and hospitals. What happens after the procedure?  Your blood pressure, heart rate, breathing rate, and blood oxygen level will be monitored until the medicines you were given have worn off.  Do not drive for 24 hours if you were given a sedative during your procedure.  You may need to stay in bed (be on bed rest) for a period of time. Summary  Inferior vena cava (IVC) filter removal is a procedure to take out a filter that was placed to prevent blood clots from traveling to your heart or lungs.  You may have your filter removed when the danger of forming blood clots has passed or when you can take blood-thinning medicines to prevent blood clots. In some cases, a filter is removed because there is a problem with it.  The removal procedure is similar to the procedure that was used to insert the filter. A long, thin tube (catheter) will be inserted through a vein in your groin or neck. Then, the filter will be gently grasped and pulled out through the catheter.  Plan to have a responsible adult care for you for at least 24 hours after you leave the hospital or clinic. This is important. This information is not intended to replace advice given to you by your  health care provider. Make sure you discuss any questions you have with your health care provider. Document Released: 02/25/2017 Document Revised: 02/25/2017 Document Reviewed: 02/25/2017 Elsevier Interactive Patient Education  2019 [employer].

## 2018-12-01 NOTE — Assessment & Plan Note (Signed)
The patient's duplex today shows chronic appearing DVT in the right leg which is expected from his old DVT.  No acute DVT is seen in either lower extremity. Postphlebitic symptoms are well controlled.  His filter can be retrieved but as listed above elective procedures have been put on hold because of COVID-19 so this will be delayed several weeks.

## 2018-12-09 ENCOUNTER — Encounter: Payer: Self-pay | Admitting: Family Medicine

## 2018-12-09 ENCOUNTER — Ambulatory Visit (INDEPENDENT_AMBULATORY_CARE_PROVIDER_SITE_OTHER): Payer: Medicare Other | Admitting: Family Medicine

## 2018-12-09 DIAGNOSIS — I825Y1 Chronic embolism and thrombosis of unspecified deep veins of right proximal lower extremity: Secondary | ICD-10-CM | POA: Diagnosis not present

## 2018-12-09 DIAGNOSIS — I4891 Unspecified atrial fibrillation: Secondary | ICD-10-CM | POA: Diagnosis not present

## 2018-12-09 DIAGNOSIS — I10 Essential (primary) hypertension: Secondary | ICD-10-CM | POA: Diagnosis not present

## 2018-12-09 DIAGNOSIS — E785 Hyperlipidemia, unspecified: Secondary | ICD-10-CM | POA: Diagnosis not present

## 2018-12-09 MED ORDER — ATENOLOL 50 MG PO TABS: 50.0000 mg | ORAL_TABLET | ORAL | 4 refills | Status: DC

## 2018-12-09 MED ORDER — LOVASTATIN 20 MG PO TABS: 20.0000 mg | ORAL_TABLET | Freq: Every day | ORAL | 3 refills | Status: DC

## 2018-12-09 MED ORDER — HYDROCHLOROTHIAZIDE 25 MG PO TABS: 25.0000 mg | ORAL_TABLET | Freq: Every day | ORAL | 3 refills | Status: DC

## 2018-12-09 MED ORDER — BENAZEPRIL HCL 20 MG PO TABS: 20.0000 mg | ORAL_TABLET | Freq: Every day | ORAL | 3 refills | Status: DC

## 2018-12-09 NOTE — Assessment & Plan Note (Signed)
The current medical regimen is effective;  continue present plan and medications.  

## 2018-12-09 NOTE — Assessment & Plan Note (Signed)
Discussed cholesterol medication patient had stopped because of concern about causing muscle aches which is now better with knee replacement will restart cholesterol medication and observe symptoms discussed challenge if develops concerning pain and to let us know.

## 2018-12-09 NOTE — Progress Notes (Signed)
BP 134/74    Subjective:    Patient ID: Hector Bennett, male    DOB: 1945/09/18, 73 y.o.   MRN: [id number]  HPI: Hector Bennett is a 73 y.o. male  Med check Discussed with patient and wife doing very well status post knee replacement surgery and is getting along well.  Had complications of blood clot in his leg and is on Xarelto.  Is followed up with Dr. do and patient has a filter that needs removal but obviously not doing that until COVID-19 issues resolve. Patient taking Xarelto without any problems. Blood pressure during hospitalization became too low and medicine was held but is now back on medications and blood pressures are doing well. Not taking any pain medications and was removed from chart. Never did take Lovenox. Benzapril hydrochlorothiazide is no longer available for the patient and is now getting the agents separated.  Telemedicine using audio/video telecommunications for a synchronous communication visit. Today's visit due to COVID-19 isolation precautions I connected with and verified that I am speaking with the correct person using two identifiers.   I discussed the limitations, risks, security and privacy concerns of performing an evaluation and management service by telecommunication and the availability of in person appointments. I also discussed with the patient that there may be a patient responsible charge related to this service. The patient expressed understanding and agreed to proceed. The patient's location is home. I am at home.  Relevant past medical, surgical, family and social history reviewed and updated as indicated. Interim medical history since our last visit reviewed. Allergies and medications reviewed and updated.  Review of Systems  Constitutional: Negative.   Respiratory: Negative.   Cardiovascular: Negative.     Per HPI unless specifically indicated above     Objective:    BP 134/74   Wt Readings from Last 3 Encounters:  12/01/18  297 lb (134.7 kg)  10/12/18 (!) 306 lb (138.8 kg)  10/05/18 (!) 306 lb (138.8 kg)    Physical Exam  Results for orders placed or performed during the hospital encounter of 10/12/18  Type and screen [place]  Result Value Ref Range   ABO/RH(D) A NEG    Antibody Screen NEG    Sample Expiration      10/15/2018 Performed at [facility], [address], [place], [place] [address]       Assessment & Plan:   Problem List Items Addressed This Visit      Cardiovascular and Mediastinum   Hypertension    The current medical regimen is effective;  continue present plan and medications.       Relevant Medications   atenolol (TENORMIN) 50 MG tablet   lovastatin (MEVACOR) 20 MG tablet   benazepril (LOTENSIN) 20 MG tablet   hydrochlorothiazide (HYDRODIURIL) 25 MG tablet   A-fib (HCC)    The current medical regimen is effective;  continue present plan and medications.       Relevant Medications   atenolol (TENORMIN) 50 MG tablet   lovastatin (MEVACOR) 20 MG tablet   benazepril (LOTENSIN) 20 MG tablet   hydrochlorothiazide (HYDRODIURIL) 25 MG tablet   DVT (deep venous thrombosis) (HCC)    The current medical regimen is effective;  continue present plan and medications.       Relevant Medications   atenolol (TENORMIN) 50 MG tablet   lovastatin (MEVACOR) 20 MG tablet   benazepril (LOTENSIN) 20 MG tablet   hydrochlorothiazide (HYDRODIURIL) 25 MG tablet  Other   Hyperlipidemia    Discussed cholesterol medication patient had stopped because of concern about causing muscle aches which is now better with knee replacement will restart cholesterol medication and observe symptoms discussed challenge if develops concerning pain and to let us know.      Relevant Medications   atenolol (TENORMIN) 50 MG tablet   lovastatin (MEVACOR) 20 MG tablet   benazepril (LOTENSIN) 20 MG tablet   hydrochlorothiazide (HYDRODIURIL) 25 MG tablet       Follow up  plan: Return in about 3 months (around 03/10/2019) for Physical Exam.

## 2018-12-17 DIAGNOSIS — Z471 Aftercare following joint replacement surgery: Secondary | ICD-10-CM | POA: Diagnosis not present

## 2018-12-30 ENCOUNTER — Telehealth (INDEPENDENT_AMBULATORY_CARE_PROVIDER_SITE_OTHER): Payer: Self-pay

## 2018-12-30 NOTE — Telephone Encounter (Signed)
I called the patient and attempted to schedule him for a IVC filter removal and because the patient's wife could not come in with him they declined to do so until a few months later. Patient will be called later to schedule.

## 2019-01-15 DIAGNOSIS — H40003 Preglaucoma, unspecified, bilateral: Secondary | ICD-10-CM | POA: Diagnosis not present

## 2019-02-01 ENCOUNTER — Other Ambulatory Visit: Payer: Self-pay | Admitting: Family Medicine

## 2019-02-01 DIAGNOSIS — I4819 Other persistent atrial fibrillation: Secondary | ICD-10-CM

## 2019-03-11 ENCOUNTER — Other Ambulatory Visit: Payer: Self-pay

## 2019-03-11 NOTE — Patient Outreach (Signed)
[facility]) Care Management  03/11/2019  IRAN ROWE [birth year] [id number]   Medication Adherence call to Mr. Ulyess Muto HIPPA Compliant Voice message left with a call back number. Mr. Bertran is showing past due on Lovastatin 20 mg and Benazepril 20 mg under [place].   [place] Management Direct Dial [phone]  Fax [phone] [email or web address]

## 2019-03-17 ENCOUNTER — Other Ambulatory Visit: Payer: Self-pay

## 2019-03-17 ENCOUNTER — Ambulatory Visit (INDEPENDENT_AMBULATORY_CARE_PROVIDER_SITE_OTHER): Payer: Medicare Other

## 2019-03-17 VITALS — BP 118/74 | HR 60 | Temp 98.0°F | Resp 17 | Ht 70.0 in | Wt 310.5 lb

## 2019-03-17 DIAGNOSIS — Z23 Encounter for immunization: Secondary | ICD-10-CM

## 2019-03-17 DIAGNOSIS — R5383 Other fatigue: Secondary | ICD-10-CM

## 2019-03-17 DIAGNOSIS — E785 Hyperlipidemia, unspecified: Secondary | ICD-10-CM

## 2019-03-17 DIAGNOSIS — Z0000 Encounter for general adult medical examination without abnormal findings: Secondary | ICD-10-CM

## 2019-03-17 DIAGNOSIS — I10 Essential (primary) hypertension: Secondary | ICD-10-CM | POA: Diagnosis not present

## 2019-03-17 DIAGNOSIS — N400 Enlarged prostate without lower urinary tract symptoms: Secondary | ICD-10-CM

## 2019-03-17 NOTE — Progress Notes (Signed)
Subjective:   Hector Bennett is a 73 y.o. male who presents for Medicare Annual/Subsequent preventive examination.  Review of Systems:   Cardiac Risk Factors include: advanced age (>62men, >76 women);hypertension;male gender;dyslipidemia;obesity (BMI >30kg/m2)     Objective:    Vitals: BP 118/74 (BP Location: Right Arm, Patient Position: Sitting, Cuff Size: Normal)   Pulse 60   Temp 98 F (36.7 C) (Temporal)   Resp 17   Ht 5\' 10"  (1.778 m)   Wt (!) 310 lb 8 oz (140.8 kg)   SpO2 97%   BMI 44.55 kg/m   Body mass index is 44.55 kg/m.  Advanced Directives 10/12/2018 10/05/2018 09/29/2018 03/02/2018 11/17/2017 10/20/2017 02/03/2017  Does Patient Have a Medical Advance Directive? No No No No Yes Yes No  Type of Advance Directive - - - - Press photographer;Living will Living will -  Copy of [place] in Chart? - - - - No - copy requested - -  Would patient like information on creating a medical advance directive? No - Patient declined No - Patient declined No - Patient declined No - Patient declined - - -    Tobacco Social History   Tobacco Use  Smoking Status Former Smoker  . Types: Cigarettes  Smokeless Tobacco Never Used  Tobacco Comment   quit 40 years ago      Counseling given: Not Answered Comment: quit 40 years ago    Clinical Intake:  Pre-visit preparation completed: Yes  Pain : 0-10 Pain Score: 7  Pain Type: Chronic pain Pain Location: Back Pain Orientation: Lower Pain Descriptors / Indicators: Aching Pain Onset: More than a month ago Pain Frequency: Constant     Nutritional Risks: None Diabetes: No  How often do you need to have someone help you when you read instructions, pamphlets, or other written materials from your doctor or pharmacy?: 1 - Never  Interpreter Needed?: No  Information entered by :: Susane Bey,LPN  Past Medical History:  Diagnosis Date  . A-fib ([place])   . Arthritis   . DVT (deep venous thrombosis)  ([place])   . Hyperlipidemia   . Hypertension   . Personal history of thrombophlebitis   . Pulmonary embolism ([place]) 11/26/2007   Bilateral   Past Surgical History:  Procedure Laterality Date  . COLONOSCOPY WITH PROPOFOL N/A 10/20/2017   Procedure: COLONOSCOPY WITH PROPOFOL;  Surgeon: Jonathon Bellows, MD;  Location[facility] ENDOSCOPY;  Service: Gastroenterology;  Laterality: N/A;  . COLONOSCOPY WITH PROPOFOL N/A 03/02/2018   Procedure: COLONOSCOPY WITH PROPOFOL;  Surgeon: Jonathon Bellows, MD;  Location: [facility] ENDOSCOPY;  Service: Gastroenterology;  Laterality: N/A;  . HAND SURGERY Left 11/09/2014  . IVC FILTER INSERTION Right 10/05/2018   Procedure: IVC FILTER INSERTION;  Surgeon: Algernon Huxley, MD;  Location: [facility] CV LAB;  Service: Cardiovascular;  Laterality: Right;  . JOINT REPLACEMENT Left    knee  . KNEE ARTHROPLASTY Left 07/31/2015   Procedure: COMPUTER ASSISTED TOTAL KNEE ARTHROPLASTY;  Surgeon: Dereck Leep, MD;  Location: [facility] ORS;  Service: Orthopedics;  Laterality: Left;  . KNEE ARTHROPLASTY Right 10/12/2018   Procedure: COMPUTER ASSISTED TOTAL KNEE ARTHROPLASTY-RIGHT;  Surgeon: Dereck Leep, MD;  Location: [facility] ORS;  Service: Orthopedics;  Laterality: Right;  . KNEE SURGERY  1975  . PERIPHERAL VASCULAR CATHETERIZATION N/A 07/25/2015   Procedure: IVC Filter Insertion;  Surgeon: Katha Cabal, MD;  Location: [place] [facility] CV LAB;  Service: Cardiovascular;  Laterality: N/A;  . PERIPHERAL VASCULAR CATHETERIZATION N/A  08/29/2015   Procedure: IVC Filter Removal;  Surgeon: Katha Cabal, MD;  Location: [place] [facility] LAB;  Service: Cardiovascular;  Laterality: N/A;   Family History  Problem Relation Age of Onset  . Hypertension Mother   . Hypertension Brother   . Diabetes Brother   . Dementia Brother    Social History   Socioeconomic History  . Marital status: Married    Spouse name: Not on file  . Number of children: Not on file  . Years of education: Not on file  . Highest  education level: High school graduate  Occupational History  . Occupation: retired  Scientific laboratory technician  . Financial resource strain: Not hard at all  . Food insecurity    Worry: Never true    Inability: Never true  . Transportation needs    Medical: No    Non-medical: No  Tobacco Use  . Smoking status: Former Smoker    Types: Cigarettes  . Smokeless tobacco: Never Used  . Tobacco comment: quit 40 years ago   Substance and Sexual Activity  . Alcohol use: Not Currently  . Drug use: No  . Sexual activity: Not on file  Lifestyle  . Physical activity    Days per week: 0 days    Minutes per session: 0 min  . Stress: Not at all  Relationships  . Social Herbalist on phone: Once a week    Gets together: More than three times a week    Attends religious service: Never    Active member of club or organization: No    Attends meetings of clubs or organizations: Never    Relationship status: Married  Other Topics Concern  . Not on file  Social History Narrative  . Not on file    Outpatient Encounter Medications as of 03/17/2019  Medication Sig  . acetaminophen (RA ACETAMINOPHEN) 650 MG CR tablet Take 650 mg by mouth every 8 (eight) hours as needed.  Marland Kitchen atenolol (TENORMIN) 50 MG tablet Take 1 tablet (50 mg total) by mouth every morning.  . benazepril (LOTENSIN) 20 MG tablet Take 1 tablet (20 mg total) by mouth daily.  . hydrochlorothiazide (HYDRODIURIL) 25 MG tablet Take 1 tablet (25 mg total) by mouth daily.  Marland Kitchen lovastatin (MEVACOR) 20 MG tablet Take 1 tablet (20 mg total) by mouth at bedtime.  Alveda Reasons 20 MG TABS tablet Take 1 tablet (20 mg total) by mouth daily with supper.  . [DISCONTINUED] traMADol (ULTRAM) 50 MG tablet Take 1-2 tablets (50-100 mg total) by mouth every 6 (six) hours as needed for moderate pain. (Patient not taking: Reported on 03/17/2019)   No facility-administered encounter medications on file as of 03/17/2019.     Activities of Daily Living In your  present state of health, do you have any difficulty performing the following activities: 03/17/2019 10/12/2018  Hearing? N N  Vision? N N  Difficulty concentrating or making decisions? N N  Walking or climbing stairs? N Y  Dressing or bathing? N N  Doing errands, shopping? N N  Preparing Food and eating ? N -  Using the Toilet? N -  In the past six months, have you accidently leaked urine? N -  Do you have problems with loss of bowel control? N -  Managing your Medications? N -  Managing your Finances? N -  Housekeeping or managing your Housekeeping? N -  Some recent data might be hidden    Patient Care Team: Golden Pop  A, MD as PCP - General (Family Medicine) Mattie Marlin, MD as Referring Physician (Vascular Surgery) Dereck Leep, MD (Orthopedic Surgery)   Assessment:   This is a routine wellness examination for [place].  Exercise Activities and Dietary recommendations Current Exercise Habits: The patient does not participate in regular exercise at present, Exercise limited by: None identified  Goals    . DIET - INCREASE WATER INTAKE     Recommend drinking at least 6-8 glasses of water a day        Fall Risk: Fall Risk  03/17/2019 08/25/2018 11/18/2017 11/17/2017 05/19/2017  Falls in the past year? 0 0 No No No  Number falls in past yr: - - - - -  Injury with Fall? - - - - -    [place]:  Any stairs in or around the home? Yes  If so, are there any without handrails? No   Home free of loose throw rugs in walkways, pet beds, electrical cords, etc? Yes  Adequate lighting in your home to reduce risk of falls? Yes   ASSISTIVE DEVICES UTILIZED TO PREVENT FALLS:  Life alert? No  Use of a cane, walker or w/c? No  Grab bars in the bathroom? No  Shower chair or bench in shower? No  Elevated toilet seat or a handicapped toilet? No   TIMED UP AND GO:  Was the test performed? Yes .  Length of time to ambulate 10 feet: 11 sec.    GAIT:  Appearance of gait: Gait steady and fast without the use of an assistive device.  Education: Fall risk prevention has been discussed.  Intervention(s) required? No  DME/home health order needed?  No   Depression Screen PHQ 2/9 Scores 03/17/2019 11/17/2017 05/19/2017 11/14/2016  PHQ - 2 Score 0 0 0 0    Cognitive Function     6CIT Screen 11/17/2017  What Year? 0 points  What month? 0 points  What time? 0 points  Count back from 20 0 points  Months in reverse 0 points  Repeat phrase 0 points  Total Score 0    Immunization History  Administered Date(s) Administered  . Influenza, High Dose Seasonal PF 10/14/2018  . Influenza,inj,Quad PF,6+ Mos 07/11/2015  . Pneumococcal Polysaccharide-23 03/17/2019  . Pneumococcal-Unspecified 08/24/2012  . Tdap 05/11/2009    Qualifies for Shingles Vaccine? Yes  Zostavax completed n/a. Due for Shingrix. Education has been provided regarding the importance of this vaccine. Pt has been advised to call insurance company to determine out of pocket expense. Advised may also receive vaccine at local pharmacy or Health Dept. Verbalized acceptance and understanding.  Tdap: up to date  Flu Vaccine: up to date   Pneumococcal Vaccine: Due for Pneumococcal vaccine. Does the patient want to receive this vaccine today?  No . Education has been provided regarding the importance of this vaccine but still declined. Advised may receive this vaccine at local pharmacy or Health Dept. Aware to provide a copy of the vaccination record if obtained from local pharmacy or Health Dept. Verbalized acceptance and understanding.   Screening Tests Health Maintenance  Topic Date Due  . PNA vac Low Risk Adult (2 of 2 - PCV13) 08/24/2013  . INFLUENZA VACCINE  03/27/2019  . TETANUS/TDAP  05/12/2019  . COLONOSCOPY  03/02/2028  . Hepatitis C Screening  Completed   Cancer Screenings:  Colorectal Screening: Completed 03/02/2018. Repeat every 10 years  Lung Cancer  Screening: (Low Dose CT Chest recommended if  Age 74-80 years, 30 pack-year currently smoking OR have quit w/in 15years.) does not qualify.    Additional Screening:  Hepatitis C Screening: does qualify; Completed 11/17/2017  Vision Screening: Recommended annual ophthalmology exams for early detection of glaucoma and other disorders of the eye. Is the patient up to date with their annual eye exam?  Yes  Who is the provider or what is the name of the office in which the pt attends annual eye exams? [facility]    Dental Screening: Recommended annual dental exams for proper oral hygiene  Community Resource Referral:  CRR required this visit?  No        Plan:  I have personally reviewed and addressed the Medicare Annual Wellness questionnaire and have noted the following in the patient's chart:  A. Medical and social history B. Use of alcohol, tobacco or illicit drugs  C. Current medications and supplements D. Functional ability and status E.  Nutritional status F.  Physical activity G. Advance directives H. List of other physicians I.  Hospitalizations, surgeries, and ER visits in previous 12 months J.  [place] such as hearing and vision if needed, cognitive and depression L. Referrals and appointments   In addition, I have reviewed and discussed with patient certain preventive protocols, quality metrics, and best practice recommendations. A written personalized care plan for preventive services as well as general preventive health recommendations were provided to patient.   Signed,   Bevelyn Ngo, LPN  [id number] Nurse Health Advisor   Nurse Notes: needs refills on benazapril, lovastatin,xarelto.

## 2019-03-17 NOTE — Patient Instructions (Signed)
[name] , Thank you for taking time to come for your Medicare Wellness Visit. I appreciate your ongoing commitment to your health goals. Please review the following plan we discussed and let me know if I can assist you in the future.   /Screening recommendations/referrals: /Colonoscopy: completed  Recommended yearly ophthalmology/optometry visit for glaucoma screening and checkup Recommended yearly dental visit for hygiene and checkup  Vaccinations: Influenza vaccine: up to date Pneumococcal vaccine: completed  Tdap vaccine: up to date Shingles vaccine: shingrix eligible, check with your insurance company for coverage     Advanced directives: Advance directive discussed with you today. I have provided a copy for you to complete at home and have notarized. Once this is complete please bring a copy in to our office so we can scan it into your chart.    Next appointment: Follow up in one year for your annual wellness visit.   Preventive Care 20 Years and Older, Male Preventive care refers to lifestyle choices and visits with your health care provider that can promote health and wellness. What does preventive care include?  A yearly physical exam. This is also called an annual well check.  Dental exams once or twice a year.  Routine eye exams. Ask your health care provider how often you should have your eyes checked.  Personal lifestyle choices, including:  Daily care of your teeth and gums.  Regular physical activity.  Eating a healthy diet.  Avoiding tobacco and drug use.  Limiting alcohol use.  Practicing safe sex.  Taking low doses of aspirin every day.  Taking vitamin and mineral supplements as recommended by your health care provider. What happens during an annual well check? The services and screenings done by your health care provider during your annual well check will depend on your age, overall health, lifestyle risk factors, and family history of disease.  Counseling  Your health care provider may ask you questions about your:  Alcohol use.  Tobacco use.  Drug use.  Emotional well-being.  Home and relationship well-being.  Sexual activity.  Eating habits.  History of falls.  Memory and ability to understand (cognition).  Work and work Statistician. Screening  You may have the following tests or measurements:  Height, weight, and BMI.  Blood pressure.  Lipid and cholesterol levels. These may be checked every 5 years, or more frequently if you are over 65 years old.  Skin check.  Lung cancer screening. You may have this screening every year starting at age 5 if you have a 30-pack-year history of smoking and currently smoke or have quit within the past 15 years.  Fecal occult blood test (FOBT) of the stool. You may have this test every year starting at age 75.  Flexible sigmoidoscopy or colonoscopy. You may have a sigmoidoscopy every 5 years or a colonoscopy every 10 years starting at age 79.  Prostate cancer screening. Recommendations will vary depending on your family history and other risks.  Hepatitis C blood test.  Hepatitis B blood test.  Sexually transmitted disease (STD) testing.  Diabetes screening. This is done by checking your blood sugar (glucose) after you have not eaten for a while (fasting). You may have this done every 1-3 years.  Abdominal aortic aneurysm (AAA) screening. You may need this if you are a current or former smoker.  Osteoporosis. You may be screened starting at age 104 if you are at high risk. Talk with your health care provider about your test results, treatment options, and if  necessary, the need for more tests. Vaccines  Your health care provider may recommend certain vaccines, such as:  Influenza vaccine. This is recommended every year.  Tetanus, diphtheria, and acellular pertussis (Tdap, Td) vaccine. You may need a Td booster every 10 years.  Zoster vaccine. You may need this  after age 40.  Pneumococcal 13-valent conjugate (PCV13) vaccine. One dose is recommended after age 82.  Pneumococcal polysaccharide (PPSV23) vaccine. One dose is recommended after age 87. Talk to your health care provider about which screenings and vaccines you need and how often you need them. This information is not intended to replace advice given to you by your health care provider. Make sure you discuss any questions you have with your health care provider. Document Released: 09/08/2015 Document Revised: 05/01/2016 Document Reviewed: 06/13/2015 Elsevier Interactive Patient Education  2017 [employer] Prevention in the Home Falls can cause injuries. They can happen to people of all ages. There are many things you can do to make your home safe and to help prevent falls. What can I do on the outside of my home?  Regularly fix the edges of walkways and driveways and fix any cracks.  Remove anything that might make you trip as you walk through a door, such as a raised step or threshold.  Trim any bushes or trees on the path to your home.  Use bright outdoor lighting.  Clear any walking paths of anything that might make someone trip, such as rocks or tools.  Regularly check to see if handrails are loose or broken. Make sure that both sides of any steps have handrails.  Any raised decks and porches should have guardrails on the edges.  Have any leaves, snow, or ice cleared regularly.  Use sand or salt on walking paths during winter.  Clean up any spills in your garage right away. This includes oil or grease spills. What can I do in the bathroom?  Use night lights.  Install grab bars by the toilet and in the tub and shower. Do not use towel bars as grab bars.  Use non-skid mats or decals in the tub or shower.  If you need to sit down in the shower, use a plastic, non-slip stool.  Keep the floor dry. Clean up any water that spills on the floor as soon as it happens.   Remove soap buildup in the tub or shower regularly.  Attach bath mats securely with double-sided non-slip rug tape.  Do not have throw rugs and other things on the floor that can make you trip. What can I do in the bedroom?  Use night lights.  Make sure that you have a light by your bed that is easy to reach.  Do not use any sheets or blankets that are too big for your bed. They should not hang down onto the floor.  Have a firm chair that has side arms. You can use this for support while you get dressed.  Do not have throw rugs and other things on the floor that can make you trip. What can I do in the kitchen?  Clean up any spills right away.  Avoid walking on wet floors.  Keep items that you use a lot in easy-to-reach places.  If you need to reach something above you, use a strong step stool that has a grab bar.  Keep electrical cords out of the way.  Do not use floor polish or wax that makes floors slippery. If you must  use wax, use non-skid floor wax.  Do not have throw rugs and other things on the floor that can make you trip. What can I do with my stairs?  Do not leave any items on the stairs.  Make sure that there are handrails on both sides of the stairs and use them. Fix handrails that are broken or loose. Make sure that handrails are as long as the stairways.  Check any carpeting to make sure that it is firmly attached to the stairs. Fix any carpet that is loose or worn.  Avoid having throw rugs at the top or bottom of the stairs. If you do have throw rugs, attach them to the floor with carpet tape.  Make sure that you have a light switch at the top of the stairs and the bottom of the stairs. If you do not have them, ask someone to add them for you. What else can I do to help prevent falls?  Wear shoes that:  Do not have high heels.  Have rubber bottoms.  Are comfortable and fit you well.  Are closed at the toe. Do not wear sandals.  If you use a  stepladder:  Make sure that it is fully opened. Do not climb a closed stepladder.  Make sure that both sides of the stepladder are locked into place.  Ask someone to hold it for you, if possible.  Clearly mark and make sure that you can see:  Any grab bars or handrails.  First and last steps.  Where the edge of each step is.  Use tools that help you move around (mobility aids) if they are needed. These include:  Canes.  Walkers.  Scooters.  Crutches.  Turn on the lights when you go into a dark area. Replace any light bulbs as soon as they burn out.  Set up your furniture so you have a clear path. Avoid moving your furniture around.  If any of your floors are uneven, fix them.  If there are any pets around you, be aware of where they are.  Review your medicines with your doctor. Some medicines can make you feel dizzy. This can increase your chance of falling. Ask your doctor what other things that you can do to help prevent falls. This information is not intended to replace advice given to you by your health care provider. Make sure you discuss any questions you have with your health care provider. Document Released: 06/08/2009 Document Revised: 01/18/2016 Document Reviewed: 09/16/2014 Elsevier Interactive Patient Education  2017 [employer].

## 2019-03-18 ENCOUNTER — Encounter: Payer: Self-pay | Admitting: Family Medicine

## 2019-03-18 ENCOUNTER — Ambulatory Visit: Payer: Self-pay

## 2019-03-18 ENCOUNTER — Ambulatory Visit (INDEPENDENT_AMBULATORY_CARE_PROVIDER_SITE_OTHER): Payer: Medicare Other | Admitting: Family Medicine

## 2019-03-18 DIAGNOSIS — I10 Essential (primary) hypertension: Secondary | ICD-10-CM

## 2019-03-18 DIAGNOSIS — I4819 Other persistent atrial fibrillation: Secondary | ICD-10-CM

## 2019-03-18 DIAGNOSIS — Z7189 Other specified counseling: Secondary | ICD-10-CM

## 2019-03-18 DIAGNOSIS — I825Y1 Chronic embolism and thrombosis of unspecified deep veins of right proximal lower extremity: Secondary | ICD-10-CM | POA: Diagnosis not present

## 2019-03-18 DIAGNOSIS — E785 Hyperlipidemia, unspecified: Secondary | ICD-10-CM | POA: Diagnosis not present

## 2019-03-18 LAB — COMPREHENSIVE METABOLIC PANEL
ALT: 12 IU/L (ref 0–44)
AST: 17 IU/L (ref 0–40)
Albumin/Globulin Ratio: 1.5 (ref 1.2–2.2)
Albumin: 4.3 g/dL (ref 3.7–4.7)
Alkaline Phosphatase: 70 IU/L (ref 39–117)
BUN/Creatinine Ratio: 12 (ref 10–24)
BUN: 13 mg/dL (ref 8–27)
Bilirubin Total: 0.6 mg/dL (ref 0.0–1.2)
CO2: 22 mmol/L (ref 20–29)
Calcium: 9.4 mg/dL (ref 8.6–10.2)
Chloride: 99 mmol/L (ref 96–106)
Creatinine, Ser: 1.12 mg/dL (ref 0.76–1.27)
GFR calc Af Amer: 74 mL/min/{1.73_m2} (ref >59)
GFR calc non Af Amer: 64 mL/min/{1.73_m2} (ref >59)
Globulin, Total: 2.8 g/dL (ref 1.5–4.5)
Glucose: 92 mg/dL (ref 65–99)
Potassium: 4.5 mmol/L (ref 3.5–5.2)
Sodium: 139 mmol/L (ref 134–144)
Total Protein: 7.1 g/dL (ref 6.0–8.5)

## 2019-03-18 LAB — LIPID PANEL W/O CHOL/HDL RATIO
Cholesterol, Total: 155 mg/dL (ref 100–199)
HDL: 42 mg/dL (ref >39)
LDL Calculated: 72 mg/dL (ref 0–99)
Triglycerides: 204 mg/dL — ABNORMAL HIGH (ref 0–149)
VLDL Cholesterol Cal: 41 mg/dL — ABNORMAL HIGH (ref 5–40)

## 2019-03-18 LAB — CBC WITH DIFFERENTIAL/PLATELET
Basophils Absolute: 0 10*3/uL (ref 0.0–0.2)
Basos: 0 %
EOS (ABSOLUTE): 0.3 10*3/uL (ref 0.0–0.4)
Eos: 3 %
Hematocrit: 43.9 % (ref 37.5–51.0)
Hemoglobin: 15.1 g/dL (ref 13.0–17.7)
Immature Grans (Abs): 0 10*3/uL (ref 0.0–0.1)
Immature Granulocytes: 0 %
Lymphocytes Absolute: 1.4 10*3/uL (ref 0.7–3.1)
Lymphs: 19 %
MCH: 31.6 pg (ref 26.6–33.0)
MCHC: 34.4 g/dL (ref 31.5–35.7)
MCV: 92 fL (ref 79–97)
Monocytes Absolute: 0.6 10*3/uL (ref 0.1–0.9)
Monocytes: 8 %
Neutrophils Absolute: 5 10*3/uL (ref 1.4–7.0)
Neutrophils: 70 %
Platelets: 228 10*3/uL (ref 150–450)
RBC: 4.78 x10E6/uL (ref 4.14–5.80)
RDW: 15.1 % (ref 11.6–15.4)
WBC: 7.3 10*3/uL (ref 3.4–10.8)

## 2019-03-18 LAB — TSH: TSH: 4.24 u[IU]/mL (ref 0.450–4.500)

## 2019-03-18 LAB — PSA: Prostate Specific Ag, Serum: 0.4 ng/mL (ref 0.0–4.0)

## 2019-03-18 MED ORDER — LOVASTATIN 20 MG PO TABS: 20.0000 mg | ORAL_TABLET | Freq: Every day | ORAL | 4 refills | Status: DC

## 2019-03-18 MED ORDER — BENAZEPRIL HCL 20 MG PO TABS: 20.0000 mg | ORAL_TABLET | Freq: Every day | ORAL | 4 refills | Status: DC

## 2019-03-18 MED ORDER — HYDROCHLOROTHIAZIDE 25 MG PO TABS: 25.0000 mg | ORAL_TABLET | Freq: Every day | ORAL | 4 refills | Status: DC

## 2019-03-18 MED ORDER — RIVAROXABAN 20 MG PO TABS: 20.0000 mg | ORAL_TABLET | Freq: Every day | ORAL | 2 refills | Status: DC

## 2019-03-18 NOTE — Assessment & Plan Note (Signed)
A voluntary discussion about advanced care planning including explanation and discussion of advanced directives was extentively discussed with the patient.  Explained about the healthcare proxy and living will was reviewed and packet with forms with expiration of how to fill them out was given.  Time spent: Encounter 16+ min individuals present: Patient 

## 2019-03-18 NOTE — Assessment & Plan Note (Signed)
Last EKG showing NSR

## 2019-03-18 NOTE — Progress Notes (Signed)
There were no vitals taken for this visit.   Subjective:    Patient ID: Hector Bennett, male    DOB: Aug 14, 1946, 73 y.o.   MRN: [id number]  HPI: Hector Bennett is a 73 y.o. male  Med check   Discussed with patient knee replacement surgery has fully resolved still has filter in place and was removed at the scheduled time because of COVID-19 restrictions will refer patient back for further evaluation for IVC filter removal if possible. Discussed Xarelto patient's been on Xarelto for atrial fibrillation and DVTs with pulmonary embolus. Reviewed previous EKG done in February with normal sinus rhythm. Other medical problems stable with good cholesterol. Reviewed labs which were showing good control.  Relevant past medical, surgical, family and social history reviewed and updated as indicated. Interim medical history since our last visit reviewed. Allergies and medications reviewed and updated.  Review of Systems  Constitutional: Negative.   HENT: Negative.   Eyes: Negative.   Respiratory: Negative.   Cardiovascular: Negative.   Gastrointestinal: Negative.   Endocrine: Negative.   Genitourinary: Negative.   Musculoskeletal: Negative.   Skin: Negative.   Allergic/Immunologic: Negative.   Neurological: Negative.   Hematological: Negative.   Psychiatric/Behavioral: Negative.     Per HPI unless specifically indicated above     Objective:    There were no vitals taken for this visit.  Wt Readings from Last 3 Encounters:  03/17/19 (!) 310 lb 8 oz (140.8 kg)  12/01/18 297 lb (134.7 kg)  10/12/18 (!) 306 lb (138.8 kg)    Physical Exam  Results for orders placed or performed in visit on 03/17/19  CBC with Differential  Result Value Ref Range   WBC 7.3 3.4 - 10.8 x10E3/uL   RBC 4.78 4.14 - 5.80 x10E6/uL   Hemoglobin 15.1 13.0 - 17.7 g/dL   Hematocrit 43.9 37.5 - 51.0 %   MCV 92 79 - 97 fL   MCH 31.6 26.6 - 33.0 pg   MCHC 34.4 31.5 - 35.7 g/dL   RDW 15.1 11.6 - 15.4 %    Platelets 228 150 - 450 x10E3/uL   Neutrophils 70 Not Estab. %   Lymphs 19 Not Estab. %   Monocytes 8 Not Estab. %   Eos 3 Not Estab. %   Basos 0 Not Estab. %   Neutrophils Absolute 5.0 1.4 - 7.0 x10E3/uL   Lymphocytes Absolute 1.4 0.7 - 3.1 x10E3/uL   Monocytes Absolute 0.6 0.1 - 0.9 x10E3/uL   EOS (ABSOLUTE) 0.3 0.0 - 0.4 x10E3/uL   Basophils Absolute 0.0 0.0 - 0.2 x10E3/uL   Immature Granulocytes 0 Not Estab. %   Immature Grans (Abs) 0.0 0.0 - 0.1 x10E3/uL  Comp Met (CMET)  Result Value Ref Range   Glucose 92 65 - 99 mg/dL   BUN 13 8 - 27 mg/dL   Creatinine, Ser 1.12 0.76 - 1.27 mg/dL   GFR calc non Af Amer 64 >59 mL/min/1.73   GFR calc Af Amer 74 >59 mL/min/1.73   BUN/Creatinine Ratio 12 10 - 24   Sodium 139 134 - 144 mmol/L   Potassium 4.5 3.5 - 5.2 mmol/L   Chloride 99 96 - 106 mmol/L   CO2 22 20 - 29 mmol/L   Calcium 9.4 8.6 - 10.2 mg/dL   Total Protein 7.1 6.0 - 8.5 g/dL   Albumin 4.3 3.7 - 4.7 g/dL   Globulin, Total 2.8 1.5 - 4.5 g/dL   Albumin/Globulin Ratio 1.5 1.2 - 2.2   Bilirubin Total  0.6 0.0 - 1.2 mg/dL   Alkaline Phosphatase 70 39 - 117 IU/L   AST 17 0 - 40 IU/L   ALT 12 0 - 44 IU/L  TSH  Result Value Ref Range   TSH 4.240 0.450 - 4.500 uIU/mL  PSA  Result Value Ref Range   Prostate Specific Ag, Serum 0.4 0.0 - 4.0 ng/mL  Lipid Panel w/o Chol/HDL Ratio  Result Value Ref Range   Cholesterol, Total 155 100 - 199 mg/dL   Triglycerides 204 (H) 0 - 149 mg/dL   HDL 42 >39 mg/dL   VLDL Cholesterol Cal 41 (H) 5 - 40 mg/dL   LDL Calculated 72 0 - 99 mg/dL      Assessment & Plan:   Problem List Items Addressed This Visit      Cardiovascular and Mediastinum   Hypertension    The current medical regimen is effective;  continue present plan and medications.       Relevant Medications   benazepril (LOTENSIN) 20 MG tablet   lovastatin (MEVACOR) 20 MG tablet   hydrochlorothiazide (HYDRODIURIL) 25 MG tablet   rivaroxaban (XARELTO) 20 MG TABS tablet    A-fib (HCC)    Last EKG showing NSR      Relevant Medications   benazepril (LOTENSIN) 20 MG tablet   lovastatin (MEVACOR) 20 MG tablet   hydrochlorothiazide (HYDRODIURIL) 25 MG tablet   rivaroxaban (XARELTO) 20 MG TABS tablet   DVT (deep venous thrombosis) (HCC) - Primary    Has IVC will refer to Dr. Lucky Cowboy for consideration of removal      Relevant Medications   benazepril (LOTENSIN) 20 MG tablet   lovastatin (MEVACOR) 20 MG tablet   hydrochlorothiazide (HYDRODIURIL) 25 MG tablet   rivaroxaban (XARELTO) 20 MG TABS tablet   Other Relevant Orders   Ambulatory referral to Vascular Surgery     Other   Hyperlipidemia    The current medical regimen is effective;  continue present plan and medications.       Relevant Medications   benazepril (LOTENSIN) 20 MG tablet   lovastatin (MEVACOR) 20 MG tablet   hydrochlorothiazide (HYDRODIURIL) 25 MG tablet   rivaroxaban (XARELTO) 20 MG TABS tablet   Advanced care planning/counseling discussion    A voluntary discussion about advanced care planning including explanation and discussion of advanced directives was extentively discussed with the patient.  Explained about the healthcare proxy and living will was reviewed and packet with forms with expiration of how to fill them out was given.  Time spent: Encounter 16+ min individuals present: Patient          Telemedicine using audio/video telecommunications for a synchronous communication visit. Today's visit due to COVID-19 isolation precautions I connected with and verified that I am speaking with the correct person using two identifiers.   I discussed the limitations, risks, security and privacy concerns of performing an evaluation and management service by telecommunication and the availability of in person appointments. I also discussed with the patient that there may be a patient responsible charge related to this service. The patient expressed understanding and agreed to proceed. The  patient's location is home. I am at home.   I discussed the assessment and treatment plan with the patient. The patient was provided an opportunity to ask questions and all were answered. The patient agreed with the plan and demonstrated an understanding of the instructions.   The patient was advised to call back or seek an in-person evaluation if the symptoms worsen or  if the condition fails to improve as anticipated.   I provided 21+ minutes of time during this encounter. Follow up plan: Return in about 6 months (around 09/18/2019).

## 2019-03-18 NOTE — Assessment & Plan Note (Signed)
Has IVC will refer to Dr. Lucky Cowboy for consideration of removal

## 2019-03-18 NOTE — Assessment & Plan Note (Signed)
The current medical regimen is effective;  continue present plan and medications.  

## 2019-03-22 DIAGNOSIS — H40003 Preglaucoma, unspecified, bilateral: Secondary | ICD-10-CM | POA: Diagnosis not present

## 2019-04-20 ENCOUNTER — Ambulatory Visit (INDEPENDENT_AMBULATORY_CARE_PROVIDER_SITE_OTHER): Payer: Medicare Other | Admitting: Vascular Surgery

## 2019-04-20 ENCOUNTER — Encounter (INDEPENDENT_AMBULATORY_CARE_PROVIDER_SITE_OTHER): Payer: Self-pay

## 2019-04-20 ENCOUNTER — Telehealth (INDEPENDENT_AMBULATORY_CARE_PROVIDER_SITE_OTHER): Payer: Self-pay

## 2019-04-20 ENCOUNTER — Other Ambulatory Visit: Payer: Self-pay

## 2019-04-20 ENCOUNTER — Encounter (INDEPENDENT_AMBULATORY_CARE_PROVIDER_SITE_OTHER): Payer: Self-pay | Admitting: Vascular Surgery

## 2019-04-20 VITALS — BP 108/70 | HR 55 | Resp 16 | Wt 308.0 lb

## 2019-04-20 DIAGNOSIS — Z95828 Presence of other vascular implants and grafts: Secondary | ICD-10-CM | POA: Diagnosis not present

## 2019-04-20 DIAGNOSIS — I4891 Unspecified atrial fibrillation: Secondary | ICD-10-CM

## 2019-04-20 DIAGNOSIS — I10 Essential (primary) hypertension: Secondary | ICD-10-CM

## 2019-04-20 DIAGNOSIS — E785 Hyperlipidemia, unspecified: Secondary | ICD-10-CM

## 2019-04-20 NOTE — Patient Instructions (Signed)
Inferior Vena Cava Filter Removal  Inferior vena cava filter removal is a procedure to take out a metal filter that was placed into a large vein in the abdomen (inferior vena cava, IVC). An IVC filter prevents blood clots in the legs or pelvis from traveling to the heart or lungs. Some IVC filters are designed to be removed (retrievable filters). You may have your filter removed when the danger of forming blood clots has passed or when you can take blood-thinning medicine to prevent blood clots. In some cases, the filter may need to be removed because it becomes damaged, is not working, or is causing problems. Most filters can be removed through the vein (percutaneous). In the rare cases when a surgeon is unable to remove the filter percutaneously, one of these steps may be taken:  A more invasive, open surgery may be necessary.  The filter may be left in place. Tell a health care provider about:  Any allergies you have.  All medicines you are taking, including vitamins, herbs, eye drops, creams, and over-the-counter medicines.  Any problems you or family members have had with anesthetic medicines or with contrast dyes that are used during an imaging test.  Any blood disorders you have.  Any surgeries you have had.  Any medical conditions you have.  Whether you are pregnant or may be pregnant. What are the risks? Generally, this is a safe procedure. However, problems may occur, including:  Infection.  Bleeding.  Allergic reactions to medicines or dyes.  Damage to the IVC, other blood vessels, or surrounding structures.  A blood clot or a piece of the filter breaking loose and traveling to the heart or lungs. What happens before the procedure? Medicines Ask your health care provider about:  Changing or stopping your regular medicines. This is especially important if you are taking diabetes medicines or blood thinners.  Taking medicines such as aspirin and ibuprofen. These  medicines can thin your blood. Do not take these medicines unless your health care provider tells you to take them.  Taking over-the-counter medicines, vitamins, herbs, and supplements. Staying hydrated Follow instructions from your health care provider about hydration, which may include:  Up to 2 hours before the procedure - you may continue to drink clear liquids, such as water or clear fruit juice. Eating and drinking restrictions Follow instructions from your health care provider about eating and drinking, which may include:  8 hours before the procedure - stop eating heavy meals or foods such as meat, fried foods, or fatty foods.  6 hours before the procedure - stop eating light meals or foods, such as toast or cereal.  6 hours before the procedure - stop drinking milk or drinks that contain milk.  2 hours before the procedure - stop drinking clear liquids. General instructions  Plan to have someone take you home from the hospital or clinic.  Plan to have a responsible adult care for you for at least 24 hours after you leave the hospital or clinic. This is important. What happens during the procedure?  To lower your risk of infection: ? Your health care team will wash or sanitize their hands. ? Hair may be removed from the surgical area. ? Your skin will be washed with soap.  An IV will be inserted into one of your veins.  You will be given one or more of the following: ? A medicine to help you relax (sedative). ? A medicine to make you fall asleep (general anesthetic).  The   procedure will be done through a vein in your groin or neck that leads to the IVC. Your health care provider will inject a numbing medicine (local anesthetic) into the skin over the vein that will be used.  A small incision will be made over the vein.  A long, thin tube (catheter) will be inserted into the vein.  The catheter will be moved through your vein and into your IVC. X-rays may be done to  help guide the catheter into place. Dye may be injected through the catheter before the X-rays to make the catheter and filter easier to see.  When the catheter reaches the filter, a hook (snare) on the end of the catheter may be used to latch onto the filter. In some cases, a grasping instrument (forceps) may be threaded through the catheter to gently grab and remove the filter instead.  After the filter has been hooked or grasped, the filter and instruments will be pulled out through the catheter.  The catheter will be removed through the incision in your skin.  Pressure will be placed over your incision until bleeding stops.  A bandage (dressing) will be placed over your incision. The procedure may vary among health care providers and hospitals. What happens after the procedure?  Your blood pressure, heart rate, breathing rate, and blood oxygen level will be monitored until the medicines you were given have worn off.  Do not drive for 24 hours if you were given a sedative during your procedure.  You may need to stay in bed (be on bed rest) for a period of time. Summary  Inferior vena cava (IVC) filter removal is a procedure to take out a filter that was placed to prevent blood clots from traveling to your heart or lungs.  You may have your filter removed when the danger of forming blood clots has passed or when you can take blood-thinning medicines to prevent blood clots. In some cases, a filter is removed because there is a problem with it.  The removal procedure is similar to the procedure that was used to insert the filter. A long, thin tube (catheter) will be inserted through a vein in your groin or neck. Then, the filter will be gently grasped and pulled out through the catheter.  Plan to have a responsible adult care for you for at least 24 hours after you leave the hospital or clinic. This is important. This information is not intended to replace advice given to you by your  health care provider. Make sure you discuss any questions you have with your health care provider. Document Released: 02/25/2017 Document Revised: 07/25/2017 Document Reviewed: 02/25/2017 Elsevier Patient Education  2020 [employer]  

## 2019-04-20 NOTE — Telephone Encounter (Signed)
Spoke with the patient and he requested that I speak with his wife. Patients wife called back and he is now scheduled with Dr. Lucky Cowboy for angio on 04/26/2019 with a 8:00 am arrival time to the [facility]. Patient will do his Covid testing on 04/22/2019 between 12:30-2:30 pm at the [facility]. pre procedure instructions were discussed with the patients wife. This will be mailed to the patient.

## 2019-04-20 NOTE — Progress Notes (Signed)
MRN : [id number]  Hector Bennett is a 73 y.o. (August 09, 1946) male who presents with chief complaint of  Chief Complaint  Patient presents with   Follow-up    discuss ivc filter removal  .  History of Present Illness: Patient returns today in follow up of his IVC filter.  He was seen about 4 months ago but his filter retrieval was put on hold due to COVID restrictions.  He has recovered very well from his knee replacement and is at normal mobility at this point.  He denies any current issues.  He is tolerating anticoagulation.  He desires to have his filter removed at this point.  Current Outpatient Medications  Medication Sig Dispense Refill   acetaminophen (RA ACETAMINOPHEN) 650 MG CR tablet Take 650 mg by mouth every 8 (eight) hours as needed.     atenolol (TENORMIN) 50 MG tablet Take 1 tablet (50 mg total) by mouth every morning. 90 tablet 4   benazepril (LOTENSIN) 20 MG tablet Take 1 tablet (20 mg total) by mouth daily. 90 tablet 4   hydrochlorothiazide (HYDRODIURIL) 25 MG tablet Take 1 tablet (25 mg total) by mouth daily. 90 tablet 4   lovastatin (MEVACOR) 20 MG tablet Take 1 tablet (20 mg total) by mouth at bedtime. 90 tablet 4   rivaroxaban (XARELTO) 20 MG TABS tablet Take 1 tablet (20 mg total) by mouth daily with supper. 30 tablet 2   No current facility-administered medications for this visit.     Past Medical History:  Diagnosis Date   A-fib [facility])    Arthritis    DVT (deep venous thrombosis) (HCC)    Hyperlipidemia    Hypertension    Personal history of thrombophlebitis    Pulmonary embolism ([place]) 11/26/2007   Bilateral    Past Surgical History:  Procedure Laterality Date   COLONOSCOPY WITH PROPOFOL N/A 10/20/2017   Procedure: COLONOSCOPY WITH PROPOFOL;  Surgeon: Jonathon Bellows, MD;  Location: [facility] ENDOSCOPY;  Service: Gastroenterology;  Laterality: N/A;   COLONOSCOPY WITH PROPOFOL N/A 03/02/2018   Procedure: COLONOSCOPY WITH PROPOFOL;  Surgeon: Jonathon Bellows, MD;  Location: [facility] ENDOSCOPY;  Service: Gastroenterology;  Laterality: N/A;   HAND SURGERY Left 11/09/2014   IVC FILTER INSERTION Right 10/05/2018   Procedure: IVC FILTER INSERTION;  Surgeon: Algernon Huxley, MD;  Location: [facility] [place] [facility] CV LAB;  Service: Cardiovascular;  Laterality: Right;   JOINT REPLACEMENT Left    knee   KNEE ARTHROPLASTY Left 07/31/2015   Procedure: COMPUTER ASSISTED TOTAL KNEE ARTHROPLASTY;  Surgeon: Dereck Leep, MD;  Location: [facility] ORS;  Service: Orthopedics;  Laterality: Left;   KNEE ARTHROPLASTY Right 10/12/2018   Procedure: COMPUTER ASSISTED TOTAL KNEE ARTHROPLASTY-RIGHT;  Surgeon: Dereck Leep, MD;  Location: [facility] ORS;  Service: Orthopedics;  Laterality: Right;   KNEE SURGERY  1975   PERIPHERAL VASCULAR CATHETERIZATION N/A 07/25/2015   Procedure: IVC Filter Insertion;  Surgeon: Katha Cabal, MD;  Location: [facility] CV LAB;  Service: Cardiovascular;  Laterality: N/A;   PERIPHERAL VASCULAR CATHETERIZATION N/A 08/29/2015   Procedure: IVC Filter Removal;  Surgeon: Katha Cabal, MD;  Location: [facility] CV LAB;  Service: Cardiovascular;  Laterality: N/A;    Family History  Problem Relation Age of Onset   Hypertension Mother    Hypertension Brother    Diabetes Brother    Dementia Brother   No aneurysms  Social History Social History        Tobacco Use   Smoking status: Never  Smoker   Smokeless tobacco: Never Used  Substance Use Topics   Alcohol use: Yes    Alcohol/week: 7.0 standard drinks    Types: 7 Cans of beer per week   Drug use: No  Married  No Known Allergies      REVIEW OF SYSTEMS(Negative unless checked)  Constitutional: _0 ??Weight loss_1 ??Fever_2 ??Chills Cardiac:_3 ??Chest pain_4 ??Chest pressure_5 ??Palpitations _6 ??Shortness of breath when laying flat _7 ??Shortness of breath at rest _8 ??Shortness of breath with exertion. Vascular: _9 ??Pain in legs with  walking_10 ??Pain in legsat rest_11 ??Pain in legs when laying flat _12 ??Claudication _13 ??Pain in feet when walking _14 ??Pain in feet at rest _15 ??Pain in feet when laying flat _16 ??History of DVT _17 ??Phlebitis _18 ??Swelling in legs _19 ??Varicose veins _20 ??Non-healing ulcers Pulmonary: _21 ??Uses home oxygen _22 ??Productive cough_23 ??Hemoptysis _24 ??Wheeze _25 ??COPD _26 ??Asthma Neurologic: _27 ??Dizziness _28 ??Blackouts _29 ??Seizures _30 ??History of stroke _31 ??History of TIA_32 ??Aphasia _33 ??Temporary blindness_34 ??Dysphagia _35 ??Weaknessor numbness in arms _36 ??Weakness or numbnessin legs Musculoskeletal: _37 ??Arthritis _38 ??Joint swelling _39 ??Joint pain _40 ??Low back pain Hematologic:_41 ??Easy bruising_42 ??Easy bleeding _43 ??Hypercoagulable state _44 ??Anemic _45 ??Hepatitis Gastrointestinal:_46 ??Blood in stool_47 ??Vomiting blood_48 ??Gastroesophageal reflux/heartburn_49 ??Difficulty swallowing  Genitourinary: _50 ??Chronic kidney disease _51 ??Difficulturination _52 ??Frequenturination _53 ??Burning with urination_54 ??Blood in urine Skin: _55 ??Rashes _56 ??Ulcers _57 ??Wounds Psychological: _58 ??History of anxiety_59 ??History of major depression.    Physical Examination  BP 108/70 (BP Location: Right Arm)    Pulse (!) 55    Resp 16    Wt (!) 308 lb (139.7 kg)    BMI 44.19 kg/m  Gen:  WD/WN, NAD Head: [place]/AT, No temporalis wasting. Ear/Nose/Throat: Hearing grossly intact, nares w/o erythema or drainage Eyes: Conjunctiva clear. Sclera non-icteric Neck: Supple.  Trachea midline Pulmonary:  Good air movement, no use of accessory muscles.  Cardiac: RRR, no JVD Vascular:  Vessel Right Left  Radial Palpable Palpable                          PT  trace palpable  1+ palpable  DP  1+ palpable Palpable   Gastrointestinal: soft, non-tender/non-distended. No guarding/reflex.  Musculoskeletal: M/S 5/5 throughout.  No deformity or atrophy.   1-2+ right lower extremity edema. Neurologic: Sensation grossly intact in extremities.  Symmetrical.  Speech is fluent.  Psychiatric: Judgment intact, Mood & affect appropriate for pt's clinical situation. Dermatologic: No rashes or ulcers noted.  Knee replacement incision has healed well       Labs Recent Results (from the past 2160 hour(s))  CBC with Differential     Status: None   Collection Time: 03/17/19  4:02 PM  Result Value Ref Range   WBC 7.3 3.4 - 10.8 x10E3/uL   RBC 4.78 4.14 - 5.80 x10E6/uL   Hemoglobin 15.1 13.0 - 17.7 g/dL   Hematocrit 43.9 37.5 - 51.0 %   MCV 92 79 - 97 fL   MCH 31.6 26.6 - 33.0 pg   MCHC 34.4 31.5 - 35.7 g/dL   RDW 15.1 11.6 - 15.4 %   Platelets 228 150 - 450 x10E3/uL   Neutrophils 70 Not Estab. %   Lymphs 19 Not Estab. %   Monocytes 8 Not Estab. %   Eos 3 Not Estab. %   Basos 0 Not Estab. %   Neutrophils Absolute 5.0 1.4 - 7.0 x10E3/uL   Lymphocytes Absolute 1.4 0.7 - 3.1 x10E3/uL   Monocytes Absolute 0.6 0.1 - 0.9 x10E3/uL   EOS (ABSOLUTE) 0.3 0.0 - 0.4 x10E3/uL   Basophils Absolute 0.0 0.0 - 0.2 x10E3/uL   Immature Granulocytes 0 Not Estab. %   Immature Grans (Abs) 0.0 0.0 - 0.1 x10E3/uL  Comp Met (CMET)     Status:  None   Collection Time: 03/17/19  4:02 PM  Result Value Ref Range   Glucose 92 65 - 99 mg/dL   BUN 13 8 - 27 mg/dL   Creatinine, Ser 1.12 0.76 - 1.27 mg/dL   GFR calc non Af Amer 64 >59 mL/min/1.73   GFR calc Af Amer 74 >59 mL/min/1.73   BUN/Creatinine Ratio 12 10 - 24   Sodium 139 134 - 144 mmol/L   Potassium 4.5 3.5 - 5.2 mmol/L   Chloride 99 96 - 106 mmol/L   CO2 22 20 - 29 mmol/L   Calcium 9.4 8.6 - 10.2 mg/dL   Total Protein 7.1 6.0 - 8.5 g/dL   Albumin 4.3 3.7 - 4.7 g/dL   Globulin, Total 2.8 1.5 - 4.5 g/dL   Albumin/Globulin Ratio 1.5 1.2 - 2.2   Bilirubin Total 0.6 0.0 - 1.2 mg/dL   Alkaline Phosphatase 70 39 - 117 IU/L   AST 17 0 - 40 IU/L   ALT 12 0 - 44 IU/L  TSH     Status: None   Collection Time:  03/17/19  4:02 PM  Result Value Ref Range   TSH 4.240 0.450 - 4.500 uIU/mL  PSA     Status: None   Collection Time: 03/17/19  4:02 PM  Result Value Ref Range   Prostate Specific Ag, Serum 0.4 0.0 - 4.0 ng/mL    Comment: Roche ECLIA methodology. According to the American Urological Association, Serum PSA should decrease and remain at undetectable levels after radical prostatectomy. The AUA defines biochemical recurrence as an initial PSA value 0.2 ng/mL or greater followed by a subsequent confirmatory PSA value 0.2 ng/mL or greater. Values obtained with different assay methods or kits cannot be used interchangeably. Results cannot be interpreted as absolute evidence of the presence or absence of malignant disease.   Lipid Panel w/o Chol/HDL Ratio     Status: Abnormal   Collection Time: 03/17/19  4:02 PM  Result Value Ref Range   Cholesterol, Total 155 100 - 199 mg/dL   Triglycerides 204 (H) 0 - 149 mg/dL   HDL 42 >39 mg/dL   VLDL Cholesterol Cal 41 (H) 5 - 40 mg/dL   LDL Calculated 72 0 - 99 mg/dL    Radiology No results found.  Assessment/Plan Hyperlipidemia lipid control important in reducing the progression of atherosclerotic disease.Not currently taking a statin   A-fib On anticoagulation and rate controlled  Hypertension blood pressure control important in reducing the progression of atherosclerotic disease. On appropriate oral medications.  S/P IVC filter This was delayed for a few months because of COVID restrictions but now we should remove his IVC filter at his convenience.  Hopefully, the delay will not lead to inability to retrieve his filter.    Leotis Pain, MD  04/20/2019 10:39 AM    This note was created with Dragon medical transcription system.  Any errors from dictation are purely unintentional

## 2019-04-20 NOTE — Assessment & Plan Note (Signed)
This was delayed for a few months because of COVID restrictions but now we should remove his IVC filter at his convenience.  Hopefully, the delay will not lead to inability to retrieve his filter.

## 2019-04-20 NOTE — Telephone Encounter (Signed)
I attempted to contact the patient to get him scheduled for a procedure, a message was left on his home number for a return call. Patients cell number was not allowing messages to be left. Patient is scheduled for 04/26/2019  With Dr. Lucky Cowboy and a 8:00 am arrival time to the [facility]. Patient will do his Covid testing on 04/22/2019 between 12:30-2:30 pm at the [facility].

## 2019-04-22 ENCOUNTER — Other Ambulatory Visit: Payer: Self-pay

## 2019-04-22 ENCOUNTER — Other Ambulatory Visit
Admission: RE | Admit: 2019-04-22 | Discharge: 2019-04-22 | Disposition: A | Discharge status: Home / Self Care | Payer: Medicare Other | Source: Ambulatory Visit | Attending: Vascular Surgery | Admitting: Vascular Surgery

## 2019-04-22 DIAGNOSIS — Z01812 Encounter for preprocedural laboratory examination: Secondary | ICD-10-CM | POA: Insufficient documentation

## 2019-04-22 DIAGNOSIS — Z20828 Contact with and (suspected) exposure to other viral communicable diseases: Secondary | ICD-10-CM | POA: Diagnosis not present

## 2019-04-22 LAB — SARS CORONAVIRUS 2 (TAT 6-24 HRS): SARS Coronavirus 2: NEGATIVE

## 2019-04-25 ENCOUNTER — Other Ambulatory Visit (INDEPENDENT_AMBULATORY_CARE_PROVIDER_SITE_OTHER): Payer: Self-pay | Admitting: Nurse Practitioner

## 2019-04-26 ENCOUNTER — Encounter
Admission: RE | Disposition: A | Discharge status: Home / Self Care | Payer: Self-pay | Source: Home / Self Care | Attending: Vascular Surgery

## 2019-04-26 ENCOUNTER — Ambulatory Visit
Admission: RE | Admit: 2019-04-26 | Discharge: 2019-04-26 | Disposition: A | Discharge status: Home / Self Care | Payer: Medicare Other | Attending: Vascular Surgery | Admitting: Vascular Surgery

## 2019-04-26 ENCOUNTER — Encounter: Payer: Self-pay | Admitting: Vascular Surgery

## 2019-04-26 ENCOUNTER — Other Ambulatory Visit: Payer: Self-pay

## 2019-04-26 DIAGNOSIS — Z452 Encounter for adjustment and management of vascular access device: Secondary | ICD-10-CM | POA: Insufficient documentation

## 2019-04-26 DIAGNOSIS — Z7901 Long term (current) use of anticoagulants: Secondary | ICD-10-CM | POA: Insufficient documentation

## 2019-04-26 DIAGNOSIS — E785 Hyperlipidemia, unspecified: Secondary | ICD-10-CM | POA: Diagnosis not present

## 2019-04-26 DIAGNOSIS — Z79899 Other long term (current) drug therapy: Secondary | ICD-10-CM | POA: Insufficient documentation

## 2019-04-26 DIAGNOSIS — I4891 Unspecified atrial fibrillation: Secondary | ICD-10-CM | POA: Diagnosis not present

## 2019-04-26 DIAGNOSIS — I10 Essential (primary) hypertension: Secondary | ICD-10-CM | POA: Diagnosis not present

## 2019-04-26 DIAGNOSIS — Z95828 Presence of other vascular implants and grafts: Secondary | ICD-10-CM | POA: Diagnosis not present

## 2019-04-26 DIAGNOSIS — I82409 Acute embolism and thrombosis of unspecified deep veins of unspecified lower extremity: Secondary | ICD-10-CM

## 2019-04-26 DIAGNOSIS — Z86711 Personal history of pulmonary embolism: Secondary | ICD-10-CM | POA: Diagnosis not present

## 2019-04-26 HISTORY — PX: IVC FILTER REMOVAL: CATH118246

## 2019-04-26 SURGERY — IVC FILTER REMOVAL
Anesthesia: Moderate Sedation

## 2019-04-26 MED ORDER — MIDAZOLAM HCL 5 MG/5ML IJ SOLN
INTRAMUSCULAR | Status: AC
  Filled 2019-04-26: qty 5

## 2019-04-26 MED ORDER — CEFAZOLIN SODIUM-DEXTROSE 2-4 GM/100ML-% IV SOLN
2.0000 g | Freq: Once | INTRAVENOUS | Status: AC
  Administered 2019-04-26: 2 g via INTRAVENOUS

## 2019-04-26 MED ORDER — IODIXANOL 320 MG/ML IV SOLN
INTRAVENOUS | Status: DC | PRN
  Administered 2019-04-26: 09:00:00 15 mL via INTRAVENOUS

## 2019-04-26 MED ORDER — HYDROMORPHONE HCL 1 MG/ML IJ SOLN: 1.0000 mg | Freq: Once | INTRAMUSCULAR | Status: DC | PRN

## 2019-04-26 MED ORDER — MIDAZOLAM HCL 2 MG/2ML IJ SOLN
INTRAMUSCULAR | Status: DC | PRN
  Administered 2019-04-26: 2 mg via INTRAVENOUS

## 2019-04-26 MED ORDER — DIPHENHYDRAMINE HCL 50 MG/ML IJ SOLN: 50.0000 mg | Freq: Once | INTRAMUSCULAR | Status: DC | PRN

## 2019-04-26 MED ORDER — MIDAZOLAM HCL 2 MG/ML PO SYRP: 8.0000 mg | ORAL_SOLUTION | Freq: Once | ORAL | Status: DC | PRN

## 2019-04-26 MED ORDER — FAMOTIDINE 20 MG PO TABS: 40.0000 mg | ORAL_TABLET | Freq: Once | ORAL | Status: DC | PRN

## 2019-04-26 MED ORDER — ONDANSETRON HCL 4 MG/2ML IJ SOLN: 4.0000 mg | Freq: Four times a day (QID) | INTRAMUSCULAR | Status: DC | PRN

## 2019-04-26 MED ORDER — FENTANYL CITRATE (PF) 100 MCG/2ML IJ SOLN
INTRAMUSCULAR | Status: DC | PRN
  Administered 2019-04-26: 50 ug via INTRAVENOUS

## 2019-04-26 MED ORDER — CEFAZOLIN SODIUM-DEXTROSE 2-4 GM/100ML-% IV SOLN
INTRAVENOUS | Status: AC
  Filled 2019-04-26: qty 100

## 2019-04-26 MED ORDER — SODIUM CHLORIDE 0.9 % IV SOLN: INTRAVENOUS | Status: DC

## 2019-04-26 MED ORDER — FENTANYL CITRATE (PF) 100 MCG/2ML IJ SOLN
INTRAMUSCULAR | Status: AC
  Filled 2019-04-26: qty 2

## 2019-04-26 MED ORDER — METHYLPREDNISOLONE SODIUM SUCC 125 MG IJ SOLR: 125.0000 mg | Freq: Once | INTRAMUSCULAR | Status: DC | PRN

## 2019-04-26 SURGICAL SUPPLY — 4 items
PACK ANGIOGRAPHY (CUSTOM PROCEDURE TRAY) ×2 IMPLANT
SET VENACAVA FILTER RETRIEVAL (MISCELLANEOUS) ×2 IMPLANT
TOWEL OR 17X26 4PK STRL BLUE (TOWEL DISPOSABLE) ×2 IMPLANT
WIRE J 3MM .035X145CM (WIRE) ×2 IMPLANT

## 2019-04-26 NOTE — Op Note (Signed)
[facility]   OPERATIVE NOTE    PRE-OPERATIVE DIAGNOSIS:  1. DVT 2. status post IVC filter placement  POST-OPERATIVE DIAGNOSIS: Same as above  PROCEDURE: 1. Ultrasound guidance for vascular access right jugular vein 2. Catheter placement into inferior vena cava from right jugular vein 3. Inferior venacavogram 4. Retrieval of Cook Celect IVC filter  SURGEON: Leotis Pain, MD  ASSISTANT(S): None  ANESTHESIA: Local with moderate conscious sedation for approximately 15 minutes using 2 mg of Versed and 50 mcg of Fentanyl  ESTIMATED BLOOD LOSS: 5 cc  CONTRAST:  15 cc  FLUORO TIME:  Less than 1 minute  FINDING(S): 1. patent IVC  SPECIMEN(S): IVC filter  INDICATIONS:  Patient is a 73 y.o. male who presents with a previous history of IVC filter placement. Patient has recovered from his knee replacement with no new PE, is now on anticoagulation and no longer needs this filter. The patient remains on anticoagulation. Risks and benefits were discussed, and informed consent was obtained.  DESCRIPTION: After obtaining full informed written consent, the patient was brought back to the vascular suite and placed supine upon the table.Moderate conscious sedation was administered during a face to face encounter with the patient throughout the procedure with my supervision of the RN administering medicines and monitoring the patient's vital signs, pulse oximetry, telemetry and mental status throughout from the start of the procedure until the patient was taken to the recovery room.  After obtaining adequate anesthesia, the patient was prepped and draped in the standard fashion. The right jugular vein was visualized with ultrasound and found to be widely patent. It was then accessed under direct ultrasound guidance without difficulty with the Seldinger needle and a permanent image was recorded. A J-wire was placed. After skin nick and dilatation, the retrieval  sheath was placed over the wire and advanced into the inferior vena cava. Inferior vena cava was imaged and found to be widely patent on inferior venacavogram. The filter was straight in its orientation. The retrieval snare was then placed through the sheath and the hook of the filter was snared without difficulty. The sheath was then advanced, and the filter was collapsed and brought into the sheath in its entirety. It was then removed from the body in its entirety. The retrieval sheath was then removed. Pressure was held at the access site and sterile dressing was placed. The patient was taken to the recovery room in stable condition having tolerated the procedure well.  COMPLICATIONS: None  CONDITION: Stable   Leotis Pain 04/26/2019 8:45 AM  This note was created with Dragon Medical transcription system. Any errors in dictation are purely unintentional.

## 2019-04-26 NOTE — H&P (Signed)
[facility] History & Physical Update  The patient was interviewed and re-examined.  The patient's previous History and Physical has been reviewed and is unchanged.  There is no change in the plan of care. We plan to proceed with the scheduled procedure.  Leotis Pain, MD  04/26/2019, 8:22 AM

## 2019-04-26 NOTE — Discharge Instructions (Signed)
Moderate Conscious Sedation, Adult, Care After °These instructions provide you with information about caring for yourself after your procedure. Your health care provider may also give you more specific instructions. Your treatment has been planned according to current medical practices, but problems sometimes occur. Call your health care provider if you have any problems or questions after your procedure. °What can I expect after the procedure? °After your procedure, it is common: °· To feel sleepy for several hours. °· To feel clumsy and have poor balance for several hours. °· To have poor judgment for several hours. °· To vomit if you eat too soon. °Follow these instructions at home: °For at least 24 hours after the procedure: ° °· Do not: °? Participate in activities where you could fall or become injured. °? Drive. °? Use heavy machinery. °? Drink alcohol. °? Take sleeping pills or medicines that cause drowsiness. °? Make important decisions or sign legal documents. °? Take care of children on your own. °· Rest. °Eating and drinking °· Follow the diet recommended by your health care provider. °· If you vomit: °? Drink water, juice, or soup when you can drink without vomiting. °? Make sure you have little or no nausea before eating solid foods. °General instructions °· Have a responsible adult stay with you until you are awake and alert. °· Take over-the-counter and prescription medicines only as told by your health care provider. °· If you smoke, do not smoke without supervision. °· Keep all follow-up visits as told by your health care provider. This is important. °Contact a health care provider if: °· You keep feeling nauseous or you keep vomiting. °· You feel light-headed. °· You develop a rash. °· You have a fever. °Get help right away if: °· You have trouble breathing. °This information is not intended to replace advice given to you by your health care provider. Make sure you discuss any questions you have  with your health care provider. °Document Released: 06/02/2013 Document Revised: 07/25/2017 Document Reviewed: 12/02/2015 °Elsevier Patient Education © 2020 Elsevier Inc[employer] °Inferior Vena Cava Filter Removal, Care After °This sheet gives you information about how to care for yourself after your procedure. Your health care provider may also give you more specific instructions. If you have problems or questions, contact your health care provider. °What can I expect after the procedure? °After the procedure, it is common to have: °· Mild pain and bruising around your incision in your neck or groin. °· Fatigue. °Follow these instructions at home: °Incision care ° °· Follow instructions from your health care provider about how to take care of your incision. Make sure you: °? Wash your hands with soap and water before you change your bandage (dressing). If soap and water are not available, use hand sanitizer. °? Change your dressing as told by your health care provider. °· Check your incision area every day for signs of infection. Check for: °? Redness, swelling, or more pain. °? Fluid or blood. °? Warmth. °? Pus or a bad smell. °General instructions °· Take over-the-counter and prescription medicines only as told by your health care provider. °· Do not take baths, swim, or use a hot tub until your health care provider approves. Ask your health care provider if you may take showers. You may only be allowed to take sponge baths. °· Do not drive for 24 hours if you were given a medicine to help you relax (sedative) during your procedure. °· Return to your normal activities as told by your   health care provider. Ask your health care provider what activities are safe for you. °· Keep all follow-up visits as told by your health care provider. This is important. °Contact a health care provider if: °· You have chills or a fever. °· You have redness, swelling, or more pain around your incision. °· Your incision feels warm to the  touch. °· You have pus or a bad smell coming from your incision. °Get help right away if: °· You have blood coming from your incision (active bleeding). °? If you have bleeding from the incision site, lie down, apply pressure to the area with a clean cloth or gauze, and get help right away. °· You have chest pain. °· You have difficulty breathing. °Summary °· Follow instructions from your health care provider about how to take care of your incision. °· Return to your normal activities as told by your health care provider. °· Check your incision area every day for signs of infection. °· Get help right away if you have active bleeding, chest pain, or trouble breathing. °This information is not intended to replace advice given to you by your health care provider. Make sure you discuss any questions you have with your health care provider. °Document Released: 02/20/2017 Document Revised: 07/25/2017 Document Reviewed: 02/20/2017 °Elsevier Patient Education © 2020 Elsevier Inc. ° °

## 2019-05-13 ENCOUNTER — Telehealth: Payer: Self-pay | Admitting: Family Medicine

## 2019-05-13 NOTE — Chronic Care Management (AMB) (Signed)
°  Chronic Care Management   Outreach Note  05/13/2019 Name: Hector Bennett MRN: [id number] DOB: Dec 02, 1945  Referred by: Guadalupe Maple, MD Reason for referral : Chronic Care Management (Initial CCM outreach was unsuccessful. )   An unsuccessful telephone outreach was attempted today. The patient was referred to the case management team by for assistance with chronic care management and care coordination.   Follow Up Plan: A HIPPA compliant phone message was left for the patient providing contact information and requesting a return call.  The care management team will reach out to the patient again over the next 7 days.  If patient returns call to provider office, please advise to call Andrew at [facility]  [email or web address]   ??[phone]

## 2019-05-18 NOTE — Chronic Care Management (AMB) (Signed)
Chronic Care Management   Note  05/18/2019 Name: Hector Bennett MRN: [id number] DOB: 19-Aug-1946  Hector Bennett is a 73 y.o. year old male who is a primary care patient of Crissman, Jeannette How, MD. I reached out to Marda Stalker by phone today in response to a referral sent by Hector Bennett's health plan.    [name] was given information about Chronic Care Management services today including:  1. CCM service includes personalized support from designated clinical staff supervised by his physician, including individualized plan of care and coordination with other care providers 2. 24/7 contact phone numbers for assistance for urgent and routine care needs. 3. Service will only be billed when office clinical staff spend 20 minutes or more in a month to coordinate care. 4. Only one practitioner may furnish and bill the service in a calendar month. 5. The patient may stop CCM services at any time (effective at the end of the month) by phone call to the office staff. 6. The patient will be responsible for cost sharing (co-pay) of up to 20% of the service fee (after annual deductible is met).  Patient did not agree to enrollment in care management services and does not wish to consider at this time.  Follow up plan: The patient has been provided with contact information for the chronic care management team and has been advised to call with any health related questions or concerns.   Jay  [email or web address]   ??[phone]

## 2019-05-24 ENCOUNTER — Ambulatory Visit (INDEPENDENT_AMBULATORY_CARE_PROVIDER_SITE_OTHER): Payer: Medicare Other | Admitting: Nurse Practitioner

## 2019-05-24 ENCOUNTER — Encounter (INDEPENDENT_AMBULATORY_CARE_PROVIDER_SITE_OTHER): Payer: Self-pay | Admitting: Nurse Practitioner

## 2019-05-24 ENCOUNTER — Other Ambulatory Visit: Payer: Self-pay

## 2019-05-24 ENCOUNTER — Encounter (INDEPENDENT_AMBULATORY_CARE_PROVIDER_SITE_OTHER): Payer: Self-pay

## 2019-05-24 VITALS — BP 123/76 | HR 60 | Resp 16 | Wt 311.0 lb

## 2019-05-24 DIAGNOSIS — M1990 Unspecified osteoarthritis, unspecified site: Secondary | ICD-10-CM

## 2019-05-24 DIAGNOSIS — Z95828 Presence of other vascular implants and grafts: Secondary | ICD-10-CM | POA: Diagnosis not present

## 2019-05-24 DIAGNOSIS — I10 Essential (primary) hypertension: Secondary | ICD-10-CM

## 2019-05-24 NOTE — Progress Notes (Signed)
SUBJECTIVE:  Patient ID: Hector Bennett, male    DOB: [birth year], 73 y.o.   MRN: [id number] Chief Complaint  Patient presents with  . Follow-up    [facility] 4week     HPI  Hector Bennett is a 74 y.o. male presents today for follow-up after IVC filter removal.  IVC filter insertion site is barely visible.  It is well-healed at this time.  Patient denies any complications post procedure.  He denies any fever, chills, nausea, vomiting or diarrhea.  He is currently been working with physical therapy following his knee replacement and doing well.  The patient denies any issues with his current anticoagulation.  Past Medical History:  Diagnosis Date  . A-fib ([place])   . Arthritis   . DVT (deep venous thrombosis) ([place])   . Hyperlipidemia   . Hypertension   . Personal history of thrombophlebitis   . Pulmonary embolism ([place]) 11/26/2007   Bilateral    Past Surgical History:  Procedure Laterality Date  . COLONOSCOPY WITH PROPOFOL N/A 10/20/2017   Procedure: COLONOSCOPY WITH PROPOFOL;  Surgeon: Jonathon Bellows, MD;  Location: [facility] ENDOSCOPY;  Service: Gastroenterology;  Laterality: N/A;  . COLONOSCOPY WITH PROPOFOL N/A 03/02/2018   Procedure: COLONOSCOPY WITH PROPOFOL;  Surgeon: Jonathon Bellows, MD;  Location: [facility] ENDOSCOPY;  Service: Gastroenterology;  Laterality: N/A;  . HAND SURGERY Left 11/09/2014  . IVC FILTER INSERTION Right 10/05/2018   Procedure: IVC FILTER INSERTION;  Surgeon: Algernon Huxley, MD;  Location: [facility] CV LAB;  Service: Cardiovascular;  Laterality: Right;  . IVC FILTER REMOVAL N/A 04/26/2019   Procedure: IVC FILTER REMOVAL;  Surgeon: Algernon Huxley, MD;  Location: [facility] CV LAB;  Service: Cardiovascular;  Laterality: N/A;  . JOINT REPLACEMENT Left    knee  . KNEE ARTHROPLASTY Left 07/31/2015   Procedure: COMPUTER ASSISTED TOTAL KNEE ARTHROPLASTY;  Surgeon: Dereck Leep, MD;  Location: [facility] ORS;  Service: Orthopedics;  Laterality: Left;  . KNEE ARTHROPLASTY Right 10/12/2018    Procedure: COMPUTER ASSISTED TOTAL KNEE ARTHROPLASTY-RIGHT;  Surgeon: Dereck Leep, MD;  Location: [facility] ORS;  Service: Orthopedics;  Laterality: Right;  . KNEE SURGERY  1975  . PERIPHERAL VASCULAR CATHETERIZATION N/A 07/25/2015   Procedure: IVC Filter Insertion;  Surgeon: Katha Cabal, MD;  Location: [facility] CV LAB;  Service: Cardiovascular;  Laterality: N/A;  . PERIPHERAL VASCULAR CATHETERIZATION N/A 08/29/2015   Procedure: IVC Filter Removal;  Surgeon: Katha Cabal, MD;  Location: [facility] CV LAB;  Service: Cardiovascular;  Laterality: N/A;    Social History   Socioeconomic History  . Marital status: Married    Spouse name: Not on file  . Number of children: Not on file  . Years of education: Not on file  . Highest education level: High school graduate  Occupational History  . Occupation: retired  Scientific laboratory technician  . Financial resource strain: Not hard at all  . Food insecurity    Worry: Never true    Inability: Never true  . Transportation needs    Medical: No    Non-medical: No  Tobacco Use  . Smoking status: Former Smoker    Types: Cigarettes  . Smokeless tobacco: Never Used  . Tobacco comment: quit 40 years ago   Substance and Sexual Activity  . Alcohol use: Not Currently  . Drug use: No  . Sexual activity: Not on file  Lifestyle  . Physical activity    Days per week: 0 days    Minutes per  session: 0 min  . Stress: Not at all  Relationships  . Social Herbalist on phone: Once a week    Gets together: More than three times a week    Attends religious service: Never    Active member of club or organization: No    Attends meetings of clubs or organizations: Never    Relationship status: Married  . Intimate partner violence    Fear of current or ex partner: No    Emotionally abused: No    Physically abused: No    Forced sexual activity: No  Other Topics Concern  . Not on file  Social History Narrative  . Not on file    Family  History  Problem Relation Age of Onset  . Hypertension Mother   . Hypertension Brother   . Diabetes Brother   . Dementia Brother     No Known Allergies   Review of Systems   Review of Systems: Negative Unless Checked Constitutional: [] Weight loss  [] Fever  [] Chills Cardiac: [] Chest pain   [x]  Atrial Fibrillation  [] Palpitations   [] Shortness of breath when laying flat   [] Shortness of breath with exertion. [] Shortness of breath at rest Vascular:  [] Pain in legs with walking   [] Pain in legs with standing [] Pain in legs when laying flat   [] Claudication    [] Pain in feet when laying flat    [] History of DVT   [] Phlebitis   [x] Swelling in legs   [] Varicose veins   [] Non-healing ulcers Pulmonary:   [] Uses home oxygen   [] Productive cough   [] Hemoptysis   [] Wheeze  [] COPD   [] Asthma Neurologic:  [] Dizziness   [] Seizures  [] Blackouts [] History of stroke   [] History of TIA  [] Aphasia   [] Temporary Blindness   [] Weakness or numbness in arm   [] Weakness or numbness in leg Musculoskeletal:   [] Joint swelling   [] Joint pain   [] Low back pain  [x]  History of Knee Replacement [x] Arthritis [] back Surgeries  []  Spinal Stenosis    Hematologic:  [] Easy bruising  [] Easy bleeding   [] Hypercoagulable state   [] Anemic Gastrointestinal:  [] Diarrhea   [] Vomiting  [] Gastroesophageal reflux/heartburn   [] Difficulty swallowing. [] Abdominal pain Genitourinary:  [] Chronic kidney disease   [] Difficult urination  [] Anuric   [] Blood in urine [] Frequent urination  [] Burning with urination   [] Hematuria Skin:  [] Rashes   [] Ulcers [] Wounds Psychological:  [] History of anxiety   []  History of major depression  []  Memory Difficulties      OBJECTIVE:   Physical Exam  BP 123/76 (BP Location: Right Arm)   Pulse 60   Resp 16   Wt (!) 311 lb (141.1 kg)   BMI 44.62 kg/m   Gen: WD/WN, NAD Head: [place]/AT, No temporalis wasting.  Ear/Nose/Throat: Hearing grossly intact, nares w/o erythema or drainage Eyes: PER, EOMI,  sclera nonicteric.  Neck: Supple, no masses.  No JVD.  Pulmonary:  Good air movement, no use of accessory muscles.  Cardiac: RRR Vascular:  2+ edema bilaterally Vessel Right Left  Radial Palpable Palpable  Dorsalis Pedis Palpable Palpable  Posterior Tibial Palpable Palpable   Gastrointestinal: soft, non-distended. No guarding/no peritoneal signs.  Musculoskeletal: M/S 5/5 throughout.  No deformity or atrophy.  Neurologic: Pain and light touch intact in extremities.  Symmetrical.  Speech is fluent. Motor exam as listed above. Psychiatric: Judgment intact, Mood & affect appropriate for pt's clinical situation. Dermatologic:  Stasis dermatitis left lower extremity. No changes consistent with cellulitis. Lymph : No Cervical  lymphadenopathy, no lichenification or skin changes of chronic lymphedema.       ASSESSMENT AND PLAN:  1. S/P IVC filter Patient has had DVT filter removed without issue.  Has successfully completed 2 knee replacement surgeries.  Patient currently on Xarelto due to history of atrial fibrillation.  Patient will follow-up with [place] on an as-needed basis.  2. Essential hypertension Continue antihypertensive medications as already ordered, these medications have been reviewed and there are no changes at this time.   3. Arthritis Continue NSAID medications as already ordered, these medications have been reviewed and there are no changes at this time.  Continued activity and therapy was stressed.    Current Outpatient Medications on File Prior to Visit  Medication Sig Dispense Refill  . acetaminophen (RA ACETAMINOPHEN) 650 MG CR tablet Take 650 mg by mouth every 8 (eight) hours as needed.    Marland Kitchen atenolol (TENORMIN) 50 MG tablet Take 1 tablet (50 mg total) by mouth every morning. 90 tablet 4  . benazepril (LOTENSIN) 20 MG tablet Take 1 tablet (20 mg total) by mouth daily. 90 tablet 4  . hydrochlorothiazide (HYDRODIURIL) 25 MG tablet Take 1 tablet (25 mg total) by mouth  daily. 90 tablet 4  . lovastatin (MEVACOR) 20 MG tablet Take 1 tablet (20 mg total) by mouth at bedtime. 90 tablet 4  . rivaroxaban (XARELTO) 20 MG TABS tablet Take 1 tablet (20 mg total) by mouth daily with supper. 30 tablet 2   No current facility-administered medications on file prior to visit.     There are no Patient Instructions on file for this visit. No follow-ups on file.   Kris Hartmann, NP  This note was completed with Sales executive.  Any errors are purely unintentional.

## 2019-06-18 ENCOUNTER — Other Ambulatory Visit: Payer: Self-pay

## 2019-06-18 NOTE — Patient Outreach (Signed)
[facility]) Care Management  06/18/2019  Hector Bennett 1946/08/16 [id number]   Medication Adherence call to [name] HIPPA Compliant Voice message left with a call back number. [name] is showing past due on Lovastatin 20 mg and Benazepril 20 mg under Hector Bennett.   [facility] Management Direct Dial [phone]  Fax [phone] [email or web address]

## 2019-06-21 ENCOUNTER — Other Ambulatory Visit: Payer: Self-pay

## 2019-06-21 DIAGNOSIS — H2513 Age-related nuclear cataract, bilateral: Secondary | ICD-10-CM | POA: Diagnosis not present

## 2019-06-21 NOTE — Patient Outreach (Signed)
[facility]) Care Management  06/21/2019  Hector Bennett 1946/03/30 [id number]   Medication Adherence call to Hector Bennett Telephone call to Patient regarding Medication Adherence unable to reach patient. Try calling patient again call both number no answer and could not leave a message,patient is past due on Lovastatin 20 mg and Benazepril 20 mg,patient call one of the nurses Roshanda she let me know he had been calling,but when calling there was no answer. Hector Bennett is showing past due under [place].   [facility] Management Direct Dial [phone]  Fax [phone] [email or web address]

## 2019-06-28 ENCOUNTER — Other Ambulatory Visit: Payer: Self-pay | Admitting: Nurse Practitioner

## 2019-06-28 ENCOUNTER — Telehealth: Payer: Self-pay | Admitting: Family Medicine

## 2019-06-28 DIAGNOSIS — I4819 Other persistent atrial fibrillation: Secondary | ICD-10-CM

## 2019-06-28 MED ORDER — RIVAROXABAN 20 MG PO TABS: 20.0000 mg | ORAL_TABLET | Freq: Every day | ORAL | 3 refills | Status: DC

## 2019-06-28 NOTE — Telephone Encounter (Signed)
Will send in refill.  Last visit in July with Dr. Jeananne Rama.  Please see if can get scheduled for follow-up in January.  Thank you.

## 2019-06-28 NOTE — Telephone Encounter (Signed)
Pt's spouse called in to request a refill for rivaroxaban (XARELTO) 20 MG TABS tablet     Pharmacy:  [facility], [address] [place] [phone] (Phone) [phone] (Fax)

## 2019-06-28 NOTE — Telephone Encounter (Signed)
Routing to provider  

## 2019-06-28 NOTE — Telephone Encounter (Signed)
Appt scheduled

## 2019-08-23 DIAGNOSIS — H2511 Age-related nuclear cataract, right eye: Secondary | ICD-10-CM | POA: Diagnosis not present

## 2019-08-23 DIAGNOSIS — I10 Essential (primary) hypertension: Secondary | ICD-10-CM | POA: Diagnosis not present

## 2019-08-30 ENCOUNTER — Encounter: Payer: Self-pay | Admitting: Nurse Practitioner

## 2019-08-30 ENCOUNTER — Encounter: Payer: Self-pay | Admitting: *Deleted

## 2019-08-30 ENCOUNTER — Other Ambulatory Visit: Payer: Self-pay

## 2019-08-30 ENCOUNTER — Ambulatory Visit (INDEPENDENT_AMBULATORY_CARE_PROVIDER_SITE_OTHER): Payer: Medicare Other | Admitting: Nurse Practitioner

## 2019-08-30 VITALS — BP 109/66 | HR 60 | Temp 98.3°F

## 2019-08-30 DIAGNOSIS — E782 Mixed hyperlipidemia: Secondary | ICD-10-CM | POA: Diagnosis not present

## 2019-08-30 DIAGNOSIS — I10 Essential (primary) hypertension: Secondary | ICD-10-CM | POA: Diagnosis not present

## 2019-08-30 DIAGNOSIS — I825Y1 Chronic embolism and thrombosis of unspecified deep veins of right proximal lower extremity: Secondary | ICD-10-CM | POA: Diagnosis not present

## 2019-08-30 DIAGNOSIS — I4819 Other persistent atrial fibrillation: Secondary | ICD-10-CM

## 2019-08-30 MED ORDER — LOVASTATIN 20 MG PO TABS: 20.0000 mg | ORAL_TABLET | Freq: Every day | ORAL | 4 refills | Status: DC

## 2019-08-30 MED ORDER — ATENOLOL 50 MG PO TABS: 50.0000 mg | ORAL_TABLET | ORAL | 4 refills | Status: DC

## 2019-08-30 MED ORDER — RIVAROXABAN 20 MG PO TABS: 20.0000 mg | ORAL_TABLET | Freq: Every day | ORAL | 3 refills | Status: DC

## 2019-08-30 MED ORDER — BENAZEPRIL HCL 20 MG PO TABS: 20.0000 mg | ORAL_TABLET | Freq: Every day | ORAL | 4 refills | Status: DC

## 2019-08-30 MED ORDER — HYDROCHLOROTHIAZIDE 25 MG PO TABS: 25.0000 mg | ORAL_TABLET | Freq: Every day | ORAL | 4 refills | Status: DC

## 2019-08-30 NOTE — Assessment & Plan Note (Signed)
On review recent EKG noting NSR.  Continue current medication regimen and adjust as needed.

## 2019-08-30 NOTE — Assessment & Plan Note (Signed)
Recommend continued focus on healthy diet choices and regular physical activity (30 minutes 5 days a week).  

## 2019-08-30 NOTE — Assessment & Plan Note (Signed)
Chronic, ongoing.  Continue current medication regimen and adjust as needed.  Refills sent.  CMP and lipid panel today.

## 2019-08-30 NOTE — Assessment & Plan Note (Signed)
Chronic, ongoing with BP at goal today and on occasional home readings.  Continue current medication regimen and adjust as needed.  Refills sent.  Return in 6 months.

## 2019-08-30 NOTE — Patient Instructions (Signed)
DASH Eating Plan DASH stands for "Dietary Approaches to Stop Hypertension." The DASH eating plan is a healthy eating plan that has been shown to reduce high blood pressure (hypertension). It may also reduce your risk for type 2 diabetes, heart disease, and stroke. The DASH eating plan may also help with weight loss. What are tips for following this plan?  General guidelines  Avoid eating more than 2,300 mg (milligrams) of salt (sodium) a day. If you have hypertension, you may need to reduce your sodium intake to 1,500 mg a day.  Limit alcohol intake to no more than 1 drink a day for nonpregnant women and 2 drinks a day for men. One drink equals 12 oz of beer, 5 oz of wine, or 1 oz of hard liquor.  Work with your health care provider to maintain a healthy body weight or to lose weight. Ask what an ideal weight is for you.  Get at least 30 minutes of exercise that causes your heart to beat faster (aerobic exercise) most days of the week. Activities may include walking, swimming, or biking.  Work with your health care provider or diet and nutrition specialist (dietitian) to adjust your eating plan to your individual calorie needs. Reading food labels   Check food labels for the amount of sodium per serving. Choose foods with less than 5 percent of the Daily Value of sodium. Generally, foods with less than 300 mg of sodium per serving fit into this eating plan.  To find whole grains, look for the word "whole" as the first word in the ingredient list. Shopping  Buy products labeled as "low-sodium" or "no salt added."  Buy fresh foods. Avoid canned foods and premade or frozen meals. Cooking  Avoid adding salt when cooking. Use salt-free seasonings or herbs instead of table salt or sea salt. Check with your health care provider or pharmacist before using salt substitutes.  Do not fry foods. Cook foods using healthy methods such as baking, boiling, grilling, and broiling instead.  Cook with  heart-healthy oils, such as olive, canola, soybean, or sunflower oil. Meal planning  Eat a balanced diet that includes: ? 5 or more servings of fruits and vegetables each day. At each meal, try to fill half of your plate with fruits and vegetables. ? Up to 6-8 servings of whole grains each day. ? Less than 6 oz of lean meat, poultry, or fish each day. A 3-oz serving of meat is about the same size as a deck of cards. One egg equals 1 oz. ? 2 servings of low-fat dairy each day. ? A serving of nuts, seeds, or beans 5 times each week. ? Heart-healthy fats. Healthy fats called Omega-3 fatty acids are found in foods such as flaxseeds and coldwater fish, like sardines, salmon, and mackerel.  Limit how much you eat of the following: ? Canned or prepackaged foods. ? Food that is high in trans fat, such as fried foods. ? Food that is high in saturated fat, such as fatty meat. ? Sweets, desserts, sugary drinks, and other foods with added sugar. ? Full-fat dairy products.  Do not salt foods before eating.  Try to eat at least 2 vegetarian meals each week.  Eat more home-cooked food and less restaurant, buffet, and fast food.  When eating at a restaurant, ask that your food be prepared with less salt or no salt, if possible. What foods are recommended? The items listed may not be a complete list. Talk with your dietitian about   what dietary choices are best for you. Grains Whole-grain or whole-wheat bread. Whole-grain or whole-wheat pasta. Brown rice. Oatmeal. Quinoa. Bulgur. Whole-grain and low-sodium cereals. Pita bread. Low-fat, low-sodium crackers. Whole-wheat flour tortillas. Vegetables Fresh or frozen vegetables (raw, steamed, roasted, or grilled). Low-sodium or reduced-sodium tomato and vegetable juice. Low-sodium or reduced-sodium tomato sauce and tomato paste. Low-sodium or reduced-sodium canned vegetables. Fruits All fresh, dried, or frozen fruit. Canned fruit in natural juice (without  added sugar). Meat and other protein foods Skinless chicken or turkey. Ground chicken or turkey. Pork with fat trimmed off. Fish and seafood. Egg whites. Dried beans, peas, or lentils. Unsalted nuts, nut butters, and seeds. Unsalted canned beans. Lean cuts of beef with fat trimmed off. Low-sodium, lean deli meat. Dairy Low-fat (1%) or fat-free (skim) milk. Fat-free, low-fat, or reduced-fat cheeses. Nonfat, low-sodium ricotta or cottage cheese. Low-fat or nonfat yogurt. Low-fat, low-sodium cheese. Fats and oils Soft margarine without trans fats. Vegetable oil. Low-fat, reduced-fat, or light mayonnaise and salad dressings (reduced-sodium). Canola, safflower, olive, soybean, and sunflower oils. Avocado. Seasoning and other foods Herbs. Spices. Seasoning mixes without salt. Unsalted popcorn and pretzels. Fat-free sweets. What foods are not recommended? The items listed may not be a complete list. Talk with your dietitian about what dietary choices are best for you. Grains Baked goods made with fat, such as croissants, muffins, or some breads. Dry pasta or rice meal packs. Vegetables Creamed or fried vegetables. Vegetables in a cheese sauce. Regular canned vegetables (not low-sodium or reduced-sodium). Regular canned tomato sauce and paste (not low-sodium or reduced-sodium). Regular tomato and vegetable juice (not low-sodium or reduced-sodium). Pickles. Olives. Fruits Canned fruit in a light or heavy syrup. Fried fruit. Fruit in cream or butter sauce. Meat and other protein foods Fatty cuts of meat. Ribs. Fried meat. Bacon. Sausage. Bologna and other processed lunch meats. Salami. Fatback. Hotdogs. Bratwurst. Salted nuts and seeds. Canned beans with added salt. Canned or smoked fish. Whole eggs or egg yolks. Chicken or turkey with skin. Dairy Whole or 2% milk, cream, and half-and-half. Whole or full-fat cream cheese. Whole-fat or sweetened yogurt. Full-fat cheese. Nondairy creamers. Whipped toppings.  Processed cheese and cheese spreads. Fats and oils Butter. Stick margarine. Lard. Shortening. Ghee. Bacon fat. Tropical oils, such as coconut, palm kernel, or palm oil. Seasoning and other foods Salted popcorn and pretzels. Onion salt, garlic salt, seasoned salt, table salt, and sea salt. Worcestershire sauce. Tartar sauce. Barbecue sauce. Teriyaki sauce. Soy sauce, including reduced-sodium. Steak sauce. Canned and packaged gravies. Fish sauce. Oyster sauce. Cocktail sauce. Horseradish that you find on the shelf. Ketchup. Mustard. Meat flavorings and tenderizers. Bouillon cubes. Hot sauce and Tabasco sauce. Premade or packaged marinades. Premade or packaged taco seasonings. Relishes. Regular salad dressings. Where to find more information:  National Heart, Lung, and Blood Institute: www.nhlbi.nih.gov  American Heart Association: www.heart.org Summary  The DASH eating plan is a healthy eating plan that has been shown to reduce high blood pressure (hypertension). It may also reduce your risk for type 2 diabetes, heart disease, and stroke.  With the DASH eating plan, you should limit salt (sodium) intake to 2,300 mg a day. If you have hypertension, you may need to reduce your sodium intake to 1,500 mg a day.  When on the DASH eating plan, aim to eat more fresh fruits and vegetables, whole grains, lean proteins, low-fat dairy, and heart-healthy fats.  Work with your health care provider or diet and nutrition specialist (dietitian) to adjust your eating plan to your   individual calorie needs. This information is not intended to replace advice given to you by your health care provider. Make sure you discuss any questions you have with your health care provider. Document Revised: 07/25/2017 Document Reviewed: 08/05/2016 Elsevier Patient Education  2020 Elsevier Inc.  

## 2019-08-30 NOTE — Progress Notes (Signed)
BP 109/66 (BP Location: Left Arm, Patient Position: Sitting, Cuff Size: Normal)   Pulse 60   Temp 98.3 F (36.8 C) (Oral)   SpO2 97%    Subjective:    Patient ID: Hector Bennett, male    DOB: April 29, 1946, 74 y.o.   MRN: [id number]  HPI: Hector Bennett is a 74 y.o. male  Chief Complaint  Patient presents with  . Follow-up    6 month   HYPERTENSION / HYPERLIPIDEMIA Continues on Benazepril, Atenolol, and HCTZ + Lovastatin and Xarelto which is taken for DVT in 2006.  When he goes into donut hole has to pay $147 for Xarelto.   Satisfied with current treatment? yes Duration of hypertension: chronic BP monitoring frequency: not checking BP range:  BP medication side effects: no Duration of hyperlipidemia: chronic Cholesterol medication side effects: no Cholesterol supplements: none Medication compliance: good compliance Aspirin: no Recent stressors: no Recurrent headaches: no Visual changes: no Palpitations: no Dyspnea: no Chest pain: no Lower extremity edema: no Dizzy/lightheaded: no   ATRIAL FIBRILLATION Atrial fibrillation status: stable Satisfied with current treatment: yes  Medication side effects:  no Medication compliance: good compliance Etiology of atrial fibrillation:  Palpitations:  no Chest pain:  no Dyspnea on exertion:  no Orthopnea:  no Syncope:  no Edema:  no Ventricular rate control: B-blocker Anti-coagulation: long acting  Relevant past medical, surgical, family and social history reviewed and updated as indicated. Interim medical history since our last visit reviewed. Allergies and medications reviewed and updated.  Review of Systems  Constitutional: Negative for activity change, diaphoresis, fatigue and fever.  Respiratory: Negative for cough, chest tightness, shortness of breath and wheezing.   Cardiovascular: Negative for chest pain, palpitations and leg swelling.  Gastrointestinal: Negative.   Neurological: Negative.     Psychiatric/Behavioral: Negative.     Per HPI unless specifically indicated above     Objective:    BP 109/66 (BP Location: Left Arm, Patient Position: Sitting, Cuff Size: Normal)   Pulse 60   Temp 98.3 F (36.8 C) (Oral)   SpO2 97%   Wt Readings from Last 3 Encounters:  05/24/19 (!) 311 lb (141.1 kg)  04/26/19 300 lb (136.1 kg)  04/20/19 (!) 308 lb (139.7 kg)    Physical Exam Vitals and nursing note reviewed.  Constitutional:      General: He is awake. He is not in acute distress.    Appearance: He is well-developed. He is obese. He is not ill-appearing.  HENT:     Head: Normocephalic and atraumatic.     Right Ear: Hearing normal. No drainage.     Left Ear: Hearing normal. No drainage.     Mouth/Throat:     Pharynx: Uvula midline.  Eyes:     General: Lids are normal.        Right eye: No discharge.        Left eye: No discharge.     Conjunctiva/sclera: Conjunctivae normal.     Pupils: Pupils are equal, round, and reactive to light.  Neck:     Vascular: No carotid bruit.  Cardiovascular:     Rate and Rhythm: Normal rate and regular rhythm.     Heart sounds: Normal heart sounds, S1 normal and S2 normal. No murmur. No gallop.   Pulmonary:     Effort: Pulmonary effort is normal. No accessory muscle usage or respiratory distress.     Breath sounds: Normal breath sounds.  Abdominal:     General: Bowel sounds are  normal.     Palpations: Abdomen is soft.  Musculoskeletal:        General: Normal range of motion.     Cervical back: Normal range of motion and neck supple.     Right lower leg: Edema (trace) present.     Left lower leg: Edema (trace) present.  Skin:    General: Skin is warm and dry.  Neurological:     Mental Status: He is alert and oriented to person, place, and time.  Psychiatric:        Mood and Affect: Mood normal.        Behavior: Behavior normal. Behavior is cooperative.        Thought Content: Thought content normal.        Judgment: Judgment  normal.     Results for orders placed or performed during the hospital encounter of 04/22/19  SARS CORONAVIRUS 2 (TAT 6-12 HRS) Nasal Swab Aptima Multi Swab   Specimen: Aptima Multi Swab; Nasal Swab  Result Value Ref Range   SARS Coronavirus 2 NEGATIVE NEGATIVE      Assessment & Plan:   Problem List Items Addressed This Visit      Cardiovascular and Mediastinum   Hypertension    Chronic, ongoing with BP at goal today and on occasional home readings.  Continue current medication regimen and adjust as needed.  Refills sent.  Return in 6 months.      Relevant Medications   benazepril (LOTENSIN) 20 MG tablet   lovastatin (MEVACOR) 20 MG tablet   rivaroxaban (XARELTO) 20 MG TABS tablet   hydrochlorothiazide (HYDRODIURIL) 25 MG tablet   atenolol (TENORMIN) 50 MG tablet   Other Relevant Orders   Comprehensive metabolic panel   A-fib (HCC)    On review recent EKG noting NSR.  Continue current medication regimen and adjust as needed.      Relevant Medications   benazepril (LOTENSIN) 20 MG tablet   lovastatin (MEVACOR) 20 MG tablet   rivaroxaban (XARELTO) 20 MG TABS tablet   hydrochlorothiazide (HYDRODIURIL) 25 MG tablet   atenolol (TENORMIN) 50 MG tablet   DVT (deep venous thrombosis) (HCC) - Primary    History of DVT, on long term anticoag.  Refills sent.  Continue current medication regimen and adjust as needed.  Continue collaboration with vascular.      Relevant Medications   benazepril (LOTENSIN) 20 MG tablet   lovastatin (MEVACOR) 20 MG tablet   rivaroxaban (XARELTO) 20 MG TABS tablet   hydrochlorothiazide (HYDRODIURIL) 25 MG tablet   atenolol (TENORMIN) 50 MG tablet     Other   Hyperlipidemia    Chronic, ongoing.  Continue current medication regimen and adjust as needed.  Refills sent.  CMP and lipid panel today.      Relevant Medications   benazepril (LOTENSIN) 20 MG tablet   lovastatin (MEVACOR) 20 MG tablet   rivaroxaban (XARELTO) 20 MG TABS tablet    hydrochlorothiazide (HYDRODIURIL) 25 MG tablet   atenolol (TENORMIN) 50 MG tablet   Other Relevant Orders   Comprehensive metabolic panel   Lipid Panel w/o Chol/HDL Ratio out   Obesity, Class III, BMI 40-49.9 (morbid obesity) ([place])    Recommend continued focus on healthy diet choices and regular physical activity (30 minutes 5 days a week).           Follow up plan: Return in about 6 months (around 02/27/2020) for Annual physical.

## 2019-08-30 NOTE — Assessment & Plan Note (Signed)
>>  ASSESSMENT AND PLAN FOR OBESITY, CLASS III, BMI 40-49.9 (MORBID OBESITY) (HCC) WRITTEN ON 08/30/2019  9:05 AM BY Cornellius Kropp T, NP  Recommend continued focus on healthy diet choices and regular physical activity (30 minutes 5 days a week).

## 2019-08-30 NOTE — Assessment & Plan Note (Signed)
History of DVT, on long term anticoag.  Refills sent.  Continue current medication regimen and adjust as needed.  Continue collaboration with vascular.

## 2019-08-31 LAB — COMPREHENSIVE METABOLIC PANEL
ALT: 12 IU/L (ref 0–44)
AST: 18 IU/L (ref 0–40)
Albumin/Globulin Ratio: 1.6 (ref 1.2–2.2)
Albumin: 4.1 g/dL (ref 3.7–4.7)
Alkaline Phosphatase: 79 IU/L (ref 39–117)
BUN/Creatinine Ratio: 11 (ref 10–24)
BUN: 12 mg/dL (ref 8–27)
Bilirubin Total: 0.7 mg/dL (ref 0.0–1.2)
CO2: 22 mmol/L (ref 20–29)
Calcium: 9.4 mg/dL (ref 8.6–10.2)
Chloride: 100 mmol/L (ref 96–106)
Creatinine, Ser: 1.14 mg/dL (ref 0.76–1.27)
GFR calc Af Amer: 72 mL/min/{1.73_m2} (ref >59)
GFR calc non Af Amer: 63 mL/min/{1.73_m2} (ref >59)
Globulin, Total: 2.5 g/dL (ref 1.5–4.5)
Glucose: 109 mg/dL — ABNORMAL HIGH (ref 65–99)
Potassium: 4.2 mmol/L (ref 3.5–5.2)
Sodium: 138 mmol/L (ref 134–144)
Total Protein: 6.6 g/dL (ref 6.0–8.5)

## 2019-08-31 LAB — LIPID PANEL W/O CHOL/HDL RATIO
Cholesterol, Total: 139 mg/dL (ref 100–199)
HDL: 43 mg/dL (ref >39)
LDL Chol Calc (NIH): 78 mg/dL (ref 0–99)
Triglycerides: 95 mg/dL (ref 0–149)
VLDL Cholesterol Cal: 18 mg/dL (ref 5–40)

## 2019-08-31 NOTE — Progress Notes (Signed)
Normal test results noted.  Please call patient and make them aware of normal results and will continue to monitor at regular visits.  Have a great day.  Look forward to seeing you at your next visit.

## 2019-09-06 ENCOUNTER — Other Ambulatory Visit
Admission: RE | Admit: 2019-09-06 | Discharge: 2019-09-06 | Disposition: A | Discharge status: Home / Self Care | Payer: Medicare Other | Source: Ambulatory Visit | Attending: Ophthalmology | Admitting: Ophthalmology

## 2019-09-06 ENCOUNTER — Other Ambulatory Visit: Payer: Self-pay

## 2019-09-06 DIAGNOSIS — Z20822 Contact with and (suspected) exposure to covid-19: Secondary | ICD-10-CM | POA: Diagnosis not present

## 2019-09-06 DIAGNOSIS — Z01812 Encounter for preprocedural laboratory examination: Secondary | ICD-10-CM | POA: Insufficient documentation

## 2019-09-06 LAB — SARS CORONAVIRUS 2 (TAT 6-24 HRS): SARS Coronavirus 2: NEGATIVE

## 2019-09-07 ENCOUNTER — Other Ambulatory Visit: Payer: Medicare Other

## 2019-09-09 ENCOUNTER — Ambulatory Visit: Payer: Medicare Other | Admitting: Certified Registered Nurse Anesthetist

## 2019-09-09 ENCOUNTER — Encounter: Payer: Self-pay | Admitting: Ophthalmology

## 2019-09-09 ENCOUNTER — Encounter
Admission: RE | Disposition: A | Discharge status: Home / Self Care | Payer: Self-pay | Source: Home / Self Care | Attending: Ophthalmology

## 2019-09-09 ENCOUNTER — Ambulatory Visit
Admission: RE | Admit: 2019-09-09 | Discharge: 2019-09-09 | Disposition: A | Discharge status: Home / Self Care | Payer: Medicare Other | Attending: Ophthalmology | Admitting: Ophthalmology

## 2019-09-09 DIAGNOSIS — I4891 Unspecified atrial fibrillation: Secondary | ICD-10-CM | POA: Diagnosis not present

## 2019-09-09 DIAGNOSIS — E785 Hyperlipidemia, unspecified: Secondary | ICD-10-CM | POA: Diagnosis not present

## 2019-09-09 DIAGNOSIS — Z86711 Personal history of pulmonary embolism: Secondary | ICD-10-CM | POA: Diagnosis not present

## 2019-09-09 DIAGNOSIS — Z96653 Presence of artificial knee joint, bilateral: Secondary | ICD-10-CM | POA: Insufficient documentation

## 2019-09-09 DIAGNOSIS — H9190 Unspecified hearing loss, unspecified ear: Secondary | ICD-10-CM | POA: Insufficient documentation

## 2019-09-09 DIAGNOSIS — Z7901 Long term (current) use of anticoagulants: Secondary | ICD-10-CM | POA: Insufficient documentation

## 2019-09-09 DIAGNOSIS — Z86718 Personal history of other venous thrombosis and embolism: Secondary | ICD-10-CM | POA: Insufficient documentation

## 2019-09-09 DIAGNOSIS — Z79899 Other long term (current) drug therapy: Secondary | ICD-10-CM | POA: Insufficient documentation

## 2019-09-09 DIAGNOSIS — H2511 Age-related nuclear cataract, right eye: Secondary | ICD-10-CM | POA: Insufficient documentation

## 2019-09-09 DIAGNOSIS — E7800 Pure hypercholesterolemia, unspecified: Secondary | ICD-10-CM | POA: Diagnosis not present

## 2019-09-09 DIAGNOSIS — I10 Essential (primary) hypertension: Secondary | ICD-10-CM | POA: Insufficient documentation

## 2019-09-09 DIAGNOSIS — Z87891 Personal history of nicotine dependence: Secondary | ICD-10-CM | POA: Diagnosis not present

## 2019-09-09 HISTORY — DX: Cardiac arrhythmia, unspecified: I49.9

## 2019-09-09 HISTORY — DX: Unspecified hearing loss, unspecified ear: H91.90

## 2019-09-09 HISTORY — PX: CATARACT EXTRACTION W/PHACO: SHX586

## 2019-09-09 SURGERY — PHACOEMULSIFICATION, CATARACT, WITH IOL INSERTION
Anesthesia: Monitor Anesthesia Care | Site: Eye | Laterality: Right

## 2019-09-09 MED ORDER — PHENYLEPHRINE HCL 10 % OP SOLN
OPHTHALMIC | Status: AC
  Filled 2019-09-09: qty 5

## 2019-09-09 MED ORDER — EPINEPHRINE PF 1 MG/ML IJ SOLN: INTRAOCULAR | Status: DC | PRN

## 2019-09-09 MED ORDER — FENTANYL CITRATE (PF) 100 MCG/2ML IJ SOLN
INTRAMUSCULAR | Status: AC
  Filled 2019-09-09: qty 2

## 2019-09-09 MED ORDER — NA CHONDROIT SULF-NA HYALURON 40-17 MG/ML IO SOLN
INTRAOCULAR | Status: DC | PRN
  Administered 2019-09-09: 1 mL via INTRAOCULAR

## 2019-09-09 MED ORDER — BSS IO SOLN
INTRAOCULAR | Status: DC | PRN
  Administered 2019-09-09: 15 mL via INTRAOCULAR

## 2019-09-09 MED ORDER — TRYPAN BLUE 0.06 % OP SOLN
OPHTHALMIC | Status: DC | PRN
  Administered 2019-09-09: 0.5 mL via INTRAOCULAR

## 2019-09-09 MED ORDER — MOXIFLOXACIN HCL 0.5 % OP SOLN: 1.0000 [drp] | Freq: Once | OPHTHALMIC | Status: DC

## 2019-09-09 MED ORDER — ARMC OPHTHALMIC DILATING DROPS
1.0000 "application " | OPHTHALMIC | Status: AC
  Administered 2019-09-09 (×3): 1 via OPHTHALMIC

## 2019-09-09 MED ORDER — MOXIFLOXACIN HCL 0.5 % OP SOLN
OPHTHALMIC | Status: DC | PRN
  Administered 2019-09-09: 0.2 mL via OPHTHALMIC

## 2019-09-09 MED ORDER — ONDANSETRON HCL 4 MG/2ML IJ SOLN: 4.0000 mg | Freq: Once | INTRAMUSCULAR | Status: DC | PRN

## 2019-09-09 MED ORDER — SODIUM CHLORIDE 0.9 % IV SOLN: INTRAVENOUS | Status: DC

## 2019-09-09 MED ORDER — POVIDONE-IODINE 5 % OP SOLN
OPHTHALMIC | Status: DC | PRN
  Administered 2019-09-09: 1 via OPHTHALMIC

## 2019-09-09 MED ORDER — FENTANYL CITRATE (PF) 100 MCG/2ML IJ SOLN
INTRAMUSCULAR | Status: DC | PRN
  Administered 2019-09-09: 25 ug via INTRAVENOUS
  Administered 2019-09-09: 50 ug via INTRAVENOUS
  Administered 2019-09-09: 25 ug via INTRAVENOUS

## 2019-09-09 MED ORDER — LIDOCAINE HCL (PF) 4 % IJ SOLN
INTRAOCULAR | Status: DC | PRN
  Administered 2019-09-09: 09:00:00 4 mL via OPHTHALMIC

## 2019-09-09 MED ORDER — CYCLOPENTOLATE HCL 2 % OP SOLN
OPHTHALMIC | Status: AC
  Filled 2019-09-09: qty 2

## 2019-09-09 MED ORDER — PROVISC 10 MG/ML IO SOLN
INTRAOCULAR | Status: DC | PRN
  Administered 2019-09-09: .85 mL via INTRAOCULAR

## 2019-09-09 MED ORDER — CARBACHOL 0.01 % IO SOLN
INTRAOCULAR | Status: DC | PRN
  Administered 2019-09-09: 0.5 mL via INTRAOCULAR

## 2019-09-09 MED ORDER — TETRACAINE HCL 0.5 % OP SOLN
1.0000 [drp] | Freq: Once | OPHTHALMIC | Status: AC
  Administered 2019-09-09 (×2): 1 [drp] via OPHTHALMIC

## 2019-09-09 SURGICAL SUPPLY — 18 items
DISSECTOR HYDRO NUCLEUS 50X22 (MISCELLANEOUS) ×12 IMPLANT
DRSG TEGADERM 2-3/8X2-3/4 SM (GAUZE/BANDAGES/DRESSINGS) ×3 IMPLANT
GLOVE BIOGEL M 6.5 STRL (GLOVE) ×3 IMPLANT
GOWN STRL REUS W/ TWL LRG LVL3 (GOWN DISPOSABLE) ×1 IMPLANT
GOWN STRL REUS W/ TWL XL LVL3 (GOWN DISPOSABLE) ×1 IMPLANT
GOWN STRL REUS W/TWL LRG LVL3 (GOWN DISPOSABLE) ×2
GOWN STRL REUS W/TWL XL LVL3 (GOWN DISPOSABLE) ×2
KNIFE 45D UP 2.3 (MISCELLANEOUS) ×3 IMPLANT
LABEL CATARACT MEDS ST (LABEL) ×3 IMPLANT
LENS IOL TECNIS ITEC 20.0 (Intraocular Lens) ×2 IMPLANT
PACK CATARACT (MISCELLANEOUS) ×3 IMPLANT
PACK CATARACT KING (MISCELLANEOUS) ×3 IMPLANT
PACK EYE AFTER SURG (MISCELLANEOUS) ×3 IMPLANT
RING MALYGIN 7.0 (MISCELLANEOUS) ×2 IMPLANT
SOL BSS BAG (MISCELLANEOUS) ×3
SOLUTION BSS BAG (MISCELLANEOUS) ×1 IMPLANT
WATER STERILE IRR 250ML POUR (IV SOLUTION) ×3 IMPLANT
WIPE NON LINTING 3.25X3.25 (MISCELLANEOUS) ×3 IMPLANT

## 2019-09-09 NOTE — Anesthesia Postprocedure Evaluation (Signed)
Anesthesia Post Note  Patient: Hector Bennett  Procedure(s) Performed: CATARACT EXTRACTION PHACO AND INTRAOCULAR LENS PLACEMENT (IOC) RIGHT (Right Eye)  Patient location during evaluation: PACU Anesthesia Type: MAC Level of consciousness: awake and alert Pain management: pain level controlled Vital Signs Assessment: post-procedure vital signs reviewed and stable Respiratory status: spontaneous breathing, nonlabored ventilation, respiratory function stable and patient connected to nasal cannula oxygen Cardiovascular status: stable and blood pressure returned to baseline Postop Assessment: no apparent nausea or vomiting Anesthetic complications: no     Last Vitals:  Vitals:   09/09/19 0810 09/09/19 0952  BP: 137/77 120/72  Pulse: 62 (!) 57  Resp: 20 18  Temp: 37.3 C (!) 36.4 C  SpO2: 97% 100%    Last Pain:  Vitals:   09/09/19 0952  TempSrc: Temporal  PainSc: 0-No pain                 Arita Miss

## 2019-09-09 NOTE — Op Note (Signed)
  PREOPERATIVE DIAGNOSIS:  Nuclear sclerotic cataract of the RIGHT eye.   POSTOPERATIVE DIAGNOSIS:  Nuclear sclerotic cataract of the RIGHT eye.   OPERATIVE PROCEDURE: Cataract surgery OD with pupil expansion device   SURGEON:  Marchia Meiers, MD.   ANESTHESIA:  Anesthesiologist: Arita Miss, MD CRNA: Johnna Acosta, CRNA  1.      Managed anesthesia care. 2.     0.16ml of Shugarcaine was instilled following the paracentesis   COMPLICATIONS:  None.   TECHNIQUE:   Divide and conquer   DESCRIPTION OF PROCEDURE:  The patient was examined and consented in the preoperative holding area where the aforementioned topical anesthesia was applied to the RIGHT eye and then brought back to the Operating Room where the RIGHT eye was prepped and draped in the usual sterile ophthalmic fashion and a lid speculum was placed. A paracentesis was created with the side port blade, the anterior chamber was washed out with trypan blue to stain the anterior capsule, and the anterior chamber was filled with viscoelastic. A near clear corneal incision was performed with the steel keratome.   The pupil was noted to be dilated at 5 mm, despite the use of pre-op dilating drops.  As such, a 7 mm malyugin ring was inserted in the usual fashion and removed at the end of the case.  A continuous curvilinear capsulorrhexis was performed with a cystotome followed by the capsulorrhexis forceps. Hydrodissection and hydrodelineation were carried out with BSS on a blunt cannula. The lens was removed in a divide and conquer  technique and the remaining cortical material was removed with the irrigation-aspiration handpiece. The capsular bag was inflated with viscoelastic and the lens was placed in the capsular bag without complication. The remaining viscoelastic was removed from the eye with the irrigation-aspiration handpiece. The wounds were hydrated. The anterior chamber was flushed and the eye was inflated to physiologic  pressure. 0.12ml Vigamox was placed in the anterior chamber. The wounds were found to be water tight. The eye was dressed with Vigamox. The patient was given protective glasses to wear throughout the day and a shield with which to sleep tonight. The patient was also given drops with which to begin a drop regimen today and will follow-up with me in one day. Implant Name Type Inv. Item Serial No. Manufacturer Lot No. LRB No. Used Action  LENS IOL DIOP 20.0 - [id number] Intraocular Lens LENS IOL DIOP 20.0 [id number]  Right 1 Implanted    Procedure(s) with comments: CATARACT EXTRACTION PHACO AND INTRAOCULAR LENS PLACEMENT (IOC) RIGHT (Right) - [place] 01:07.3 CDE 8.71 Fluid Pack Lot # [id number]  Electronically signed: Marchia Meiers 09/09/2019 9:54 AM

## 2019-09-09 NOTE — Anesthesia Procedure Notes (Signed)
Date/Time: 09/09/2019 9:01 AM Performed by: Johnna Acosta, CRNA Pre-anesthesia Checklist: Patient identified, Emergency Drugs available, Suction available, Patient being monitored and Timeout performed Patient Re-evaluated:Patient Re-evaluated prior to induction Oxygen Delivery Method: Nasal cannula Preoxygenation: Pre-oxygenation with 100% oxygen Induction Type: IV induction

## 2019-09-09 NOTE — Discharge Instructions (Addendum)
Eye Surgery Discharge Instructions  Expect mild scratchy sensation or mild soreness. DO NOT RUB YOUR EYE!  The day of surgery: . Minimal physical activity, but bed rest is not required . No reading, computer work, or close hand work . No bending, lifting, or straining. . May watch TV  For 24 hours: . No driving, legal decisions, or alcoholic beverages . Safety precautions . Eat anything you prefer: It is better to start with liquids, then soup then solid foods. . Solar shield eyeglasses should be worn for comfort in the sunlight/patch while sleeping  Resume all regular medications including aspirin or Coumadin if these were discontinued prior to surgery. You may shower, bathe, shave, or wash your hair. Tylenol may be taken for mild discomfort. Follow Dr. Sharene Butters eye drop instruction sheet as reviewed.  Call your doctor if you experience significant pain, nausea, or vomiting, fever > 101 or other signs of infection. [phone] or [phone] Specific instructions:  Follow-up Information    Marchia Meiers, MD Follow up.   Specialty: Ophthalmology Why: 09/10/19 @ 8:50 am  Contact information: [place] [id number] [phone]

## 2019-09-09 NOTE — H&P (Signed)
   I have reviewed the patient's H&P and agree with its findings. There have been no interval changes.  Anniemae Haberkorn MD Ophthalmology 

## 2019-09-09 NOTE — Anesthesia Preprocedure Evaluation (Signed)
Anesthesia Evaluation  Patient identified by MRN, date of birth, ID band Patient awake    Reviewed: Allergy & Precautions, NPO status , Patient's Chart, lab work & pertinent test results  History of Anesthesia Complications Negative for: history of anesthetic complications  Airway Mallampati: III  TM Distance: >3 FB Neck ROM: Full    Dental  (+) Teeth Intact, Upper Dentures   Pulmonary neg pulmonary ROS, neg sleep apnea, neg COPD, Patient abstained from smoking.Not current smoker, former smoker,    Pulmonary exam normal breath sounds clear to auscultation       Cardiovascular Exercise Tolerance: Good METShypertension, Pt. on medications (-) CAD and (-) Past MI + dysrhythmias Atrial Fibrillation  Rhythm:Regular Rate:Normal - Systolic murmurs    Neuro/Psych negative neurological ROS  negative psych ROS   GI/Hepatic neg GERD  ,(+)     (-) substance abuse  ,   Endo/Other  neg diabetes  Renal/GU negative Renal ROS     Musculoskeletal   Abdominal   Peds  Hematology   Anesthesia Other Findings Past Medical History: No date: A-fib [facility]) No date: Arthritis 2007, 2009: DVT (deep venous thrombosis) (HCC) No date: Dysrhythmia     Comment:  af No date: Hearing loss No date: Hyperlipidemia No date: Hypertension No date: Personal history of thrombophlebitis 11/26/2007: Pulmonary embolism (HCC)     Comment:  Bilateral  Reproductive/Obstetrics                             Anesthesia Physical Anesthesia Plan  ASA: III  Anesthesia Plan: MAC   Post-op Pain Management:    Induction: Intravenous  PONV Risk Score and Plan: 1 and Midazolam  Airway Management Planned: Nasal Cannula  Additional Equipment:   Intra-op Plan:   Post-operative Plan:   Informed Consent: I have reviewed the patients History and Physical, chart, labs and discussed the procedure including the risks, benefits and  alternatives for the proposed anesthesia with the patient or authorized representative who has indicated his/her understanding and acceptance.       Plan Discussed with: CRNA and Surgeon  Anesthesia Plan Comments: (Explained risks of anesthesia, including PONV, and rare emergencies requiring invasive intervention. Patient understands. )        Anesthesia Quick Evaluation

## 2019-09-09 NOTE — Transfer of Care (Signed)
Immediate Anesthesia Transfer of Care Note  Patient: Hector Bennett  Procedure(s) Performed: CATARACT EXTRACTION PHACO AND INTRAOCULAR LENS PLACEMENT (IOC) RIGHT (Right Eye)  Patient Location: PACU  Anesthesia Type:MAC  Level of Consciousness: awake, alert  and oriented  Airway & Oxygen Therapy: Patient Spontanous Breathing  Post-op Assessment: Report given to RN and Post -op Vital signs reviewed and stable  Post vital signs: Reviewed and stable  Last Vitals:  Vitals Value Taken Time  BP 120/72 09/09/19 0952  Temp 36.4 C 09/09/19 0952  Pulse 57 09/09/19 0952  Resp 18 09/09/19 0952  SpO2 100 % 09/09/19 0952    Last Pain:  Vitals:   09/09/19 0952  TempSrc: Temporal  PainSc: 0-No pain         Complications: No apparent anesthesia complications

## 2019-10-18 DIAGNOSIS — Z961 Presence of intraocular lens: Secondary | ICD-10-CM | POA: Diagnosis not present

## 2019-11-02 DIAGNOSIS — M170 Bilateral primary osteoarthritis of knee: Secondary | ICD-10-CM | POA: Diagnosis not present

## 2019-11-02 DIAGNOSIS — Z96653 Presence of artificial knee joint, bilateral: Secondary | ICD-10-CM | POA: Diagnosis not present

## 2019-11-02 DIAGNOSIS — Z96652 Presence of left artificial knee joint: Secondary | ICD-10-CM | POA: Diagnosis not present

## 2019-11-02 DIAGNOSIS — Z96651 Presence of right artificial knee joint: Secondary | ICD-10-CM | POA: Diagnosis not present

## 2019-11-15 DIAGNOSIS — H5789 Other specified disorders of eye and adnexa: Secondary | ICD-10-CM | POA: Diagnosis not present

## 2019-12-07 ENCOUNTER — Telehealth: Payer: Self-pay | Admitting: Nurse Practitioner

## 2019-12-07 NOTE — Telephone Encounter (Signed)
Pt's wife is here stating he needs clearance for tooth extraction, Pt has apt on 12/13/2019.

## 2019-12-07 NOTE — Telephone Encounter (Signed)
Noted, thank you.  Will need to bring clearance forms then.

## 2019-12-07 NOTE — Telephone Encounter (Signed)
Pt's wife verbalized and confirmed understanding.

## 2019-12-08 ENCOUNTER — Telehealth (INDEPENDENT_AMBULATORY_CARE_PROVIDER_SITE_OTHER): Payer: Self-pay

## 2019-12-08 NOTE — Telephone Encounter (Signed)
Pt's wife called and left a message on the nurse line to have paper work from our office faxed over to the pt's dental office due to the pt's up coming dental surgery. I returned the pt's wife call and left a VM requesting more information about what she needed  to have sent and where and for her to return the call to [facility] so we could process her request.

## 2019-12-13 ENCOUNTER — Encounter: Payer: Self-pay | Admitting: Nurse Practitioner

## 2019-12-13 ENCOUNTER — Ambulatory Visit (INDEPENDENT_AMBULATORY_CARE_PROVIDER_SITE_OTHER): Payer: Medicare Other | Admitting: Nurse Practitioner

## 2019-12-13 ENCOUNTER — Other Ambulatory Visit: Payer: Self-pay

## 2019-12-13 VITALS — BP 134/84 | HR 58 | Temp 98.1°F | Wt 318.4 lb

## 2019-12-13 DIAGNOSIS — Z01818 Encounter for other preprocedural examination: Secondary | ICD-10-CM | POA: Diagnosis not present

## 2019-12-13 NOTE — Patient Instructions (Signed)
Dental Extraction, Care After These instructions give you information about caring for yourself after your procedure. Your doctor may also give you more specific instructions. Call your doctor if you have any problems or questions after your procedure. Follow these instructions at home: Mouth care  Follow instructions from your doctor about how to take care of the area where your tooth was taken out. Make sure you: ? Wash your hands with soap and water before you touch your mouth or your gauze pads. If you do not have soap and water, use hand sanitizer. ? Change your gauze and take it out as told by your doctor. ? Leave stitches (sutures) in place. They may need to stay in place for 2 weeks or longer, or they may dissolve on their own over several weeks.  If you have bleeding that does not stop, fold a clean piece of gauze and place it on the bleeding gum. Bite down on it gently but firmly. Do not chew on the gauze.  Do not do any of these things until your doctor says it is okay: ? Rinse your mouth. ? Brush or floss near the area where your tooth was taken out. You may brush your other teeth. ? Spit.  After your doctor says that you may rinse your mouth: ? Gargle with a salt-water mixture 3-4 times a day or as needed. To make a salt-water mixture, completely dissolve -1 tsp of salt in 1 cup of warm water. ? Rinse very gently. Do not rinse with a lot of force, because doing that can affect how your mouth heals.  If you wear fake teeth (dental prostheses), talk with your doctor about when you may start to wear them again. Eating and drinking   Do not drink through a straw until your doctor says it is okay.  Eat foods that are cool and have a soft texture, as told by your doctor. Some examples are ice cream and yogurt.  Avoid hot drinks and spicy foods until your mouth has healed. Activity  Do not drive or use heavy machinery for 24 hours if you were given a medicine to help you relax  (sedative) during your procedure.  Do not drive or use heavy machinery while taking prescription pain medicine.  Return to your normal activities as told by your doctor. Ask your doctor what activities are safe for you. Managing pain and swelling  If told, put ice on your cheek on the side of your mouth where the tooth was taken out: ? Put ice in a plastic bag. ? Place a towel between your skin and the bag. ? Leave the ice on for 20 minutes, 2-3 times a day. General instructions  Take over-the-counter and prescription medicines only as told by your doctor.  If you are taking prescription pain medicine, take actions to prevent or treat constipation. Your doctor may recommend that you: ? Drink enough fluid to keep your pee (urine) pale yellow. ? Limit foods that are high in fat and processed sugars, such as fried or sweet foods. ? Take an over-the-counter or prescription medicine for constipation.  If you were prescribed an antibiotic medicine, take it as told by your doctor. Do not stop taking the antibiotic even if you start to feel better.  Do not use any products that contain nicotine or tobacco. These include cigarettes and e-cigarettes. If you need help quitting, ask your doctor.  Keep all follow-up visits as told by your doctor. This is important. Contact a   doctor if:  You have pain that does not get better after you take your medicine.  You have any of the following: ? A fever. ? Feeling sick to your stomach (nausea). ? Throwing up (vomiting). ? Chills.  You have any of these in or near the place where your tooth used to be (socket): ? A lot of redness on your face. ? A lot of swelling in your mouth or on your face. ? A small amount of clear fluid or pus. ? New bleeding.  Your symptoms get worse.  You get new symptoms.  You lose feeling (numbness) in your lip or jaw and do not get it back.  You have tingling in your lip or jaw that does not go away. Get help  right away if:  You have very bad bleeding.  You have bleeding that does not stop after you bite down on many gauze pads.  You have very bad pain that does not get better with medicine.  You have swelling that gets worse instead of better.  You have a lot of clear fluid or pus coming from where your tooth was taken out.  You have trouble swallowing.  You cannot open your mouth.  You have shortness of breath.  You have chest pain. Summary  If you have bleeding that does not stop, fold a clean piece of gauze and place it on the bleeding gum. Bite on the gauze gently but firmly.  Do not rinse your mouth or spit until your doctor says that it is okay.  Avoid hot drinks and spicy foods until your mouth heals. This information is not intended to replace advice given to you by your health care provider. Make sure you discuss any questions you have with your health care provider. Document Revised: 07/25/2017 Document Reviewed: 05/29/2017 Elsevier Patient Education  2020 [employer]   

## 2019-12-13 NOTE — Progress Notes (Signed)
BP 134/84 (BP Location: Left Arm, Patient Position: Sitting, Cuff Size: Large)   Pulse (!) 58   Temp 98.1 F (36.7 C) (Oral)   Wt (!) 318 lb 6 oz (144.4 kg)   SpO2 97%   BMI 42.00 kg/m    Subjective:    Patient ID: Hector Bennett, male    DOB: 02/24/1946, 74 y.o.   MRN: [id number]  HPI: Hector Bennett is a 74 y.o. male  Chief Complaint  Patient presents with  . Medical Clearance    pt needs clearance for tooth extraction   CLEARANCE FOR DENTAL PROCEDURE: Going on May 24th for extraction and implants.  Needs letter stating what to do with his Xarelto.  Discussed at length with them, he will hold Xarelto for 24 hours prior to procedure and restart 24 hours after. They report this is similar to what he had done before with procedures.    Relevant past medical, surgical, family and social history reviewed and updated as indicated. Interim medical history since our last visit reviewed. Allergies and medications reviewed and updated.  Review of Systems  Constitutional: Negative for activity change, diaphoresis, fatigue and fever.  Respiratory: Negative for cough, chest tightness, shortness of breath and wheezing.   Cardiovascular: Negative for chest pain, palpitations and leg swelling.  Neurological: Negative.   Psychiatric/Behavioral: Negative.     Per HPI unless specifically indicated above     Objective:    BP 134/84 (BP Location: Left Arm, Patient Position: Sitting, Cuff Size: Large)   Pulse (!) 58   Temp 98.1 F (36.7 C) (Oral)   Wt (!) 318 lb 6 oz (144.4 kg)   SpO2 97%   BMI 42.00 kg/m   Wt Readings from Last 3 Encounters:  12/13/19 (!) 318 lb 6 oz (144.4 kg)  08/30/19 (!) 305 lb (138.3 kg)  05/24/19 (!) 311 lb (141.1 kg)    Physical Exam Vitals and nursing note reviewed.  Constitutional:      General: He is awake. He is not in acute distress.    Appearance: He is well-developed and well-groomed. He is obese. He is not ill-appearing.  HENT:     Head:  Normocephalic and atraumatic.     Right Ear: Decreased hearing noted. No drainage.     Left Ear: Decreased hearing noted. No drainage.  Eyes:     General: Lids are normal.        Right eye: No discharge.        Left eye: No discharge.     Conjunctiva/sclera: Conjunctivae normal.     Pupils: Pupils are equal, round, and reactive to light.  Neck:     Vascular: No carotid bruit.  Cardiovascular:     Rate and Rhythm: Normal rate and regular rhythm.     Heart sounds: Normal heart sounds, S1 normal and S2 normal. No murmur. No gallop.   Pulmonary:     Effort: Pulmonary effort is normal. No accessory muscle usage or respiratory distress.     Breath sounds: Normal breath sounds.  Abdominal:     General: Bowel sounds are normal.     Palpations: Abdomen is soft. There is no hepatomegaly or splenomegaly.  Musculoskeletal:        General: Normal range of motion.     Cervical back: Normal range of motion and neck supple.     Right lower leg: No edema.     Left lower leg: No edema.  Skin:    General: Skin is  warm and dry.     Capillary Refill: Capillary refill takes less than 2 seconds.  Neurological:     Mental Status: He is alert and oriented to person, place, and time.  Psychiatric:        Attention and Perception: Attention normal.        Mood and Affect: Mood normal.        Speech: Speech normal.        Behavior: Behavior normal. Behavior is cooperative.        Thought Content: Thought content normal.     Results for orders placed or performed during the hospital encounter of 09/06/19  SARS CORONAVIRUS 2 (TAT 6-24 HRS) Nasopharyngeal Nasopharyngeal Swab   Specimen: Nasopharyngeal Swab  Result Value Ref Range   SARS Coronavirus 2 NEGATIVE NEGATIVE      Assessment & Plan:   Problem List Items Addressed This Visit    None    Visit Diagnoses    Pre-operative clearance    -  Primary   Upcoming dental extraction and implants.  Educated on [place] and letter supplied.        Follow up plan: Return if symptoms worsen or fail to improve.

## 2020-03-03 ENCOUNTER — Encounter: Payer: Self-pay | Admitting: Nurse Practitioner

## 2020-03-03 DIAGNOSIS — D6869 Other thrombophilia: Secondary | ICD-10-CM | POA: Insufficient documentation

## 2020-03-06 ENCOUNTER — Other Ambulatory Visit: Payer: Self-pay

## 2020-03-06 ENCOUNTER — Encounter: Payer: Self-pay | Admitting: Nurse Practitioner

## 2020-03-06 ENCOUNTER — Ambulatory Visit (INDEPENDENT_AMBULATORY_CARE_PROVIDER_SITE_OTHER): Payer: Medicare Other | Admitting: Nurse Practitioner

## 2020-03-06 VITALS — BP 118/72 | HR 67 | Temp 98.5°F | Ht 69.5 in | Wt 309.4 lb

## 2020-03-06 DIAGNOSIS — I4819 Other persistent atrial fibrillation: Secondary | ICD-10-CM

## 2020-03-06 DIAGNOSIS — Z23 Encounter for immunization: Secondary | ICD-10-CM

## 2020-03-06 DIAGNOSIS — I10 Essential (primary) hypertension: Secondary | ICD-10-CM

## 2020-03-06 DIAGNOSIS — Z0000 Encounter for general adult medical examination without abnormal findings: Secondary | ICD-10-CM | POA: Diagnosis not present

## 2020-03-06 DIAGNOSIS — I872 Venous insufficiency (chronic) (peripheral): Secondary | ICD-10-CM

## 2020-03-06 DIAGNOSIS — D6869 Other thrombophilia: Secondary | ICD-10-CM | POA: Diagnosis not present

## 2020-03-06 DIAGNOSIS — E782 Mixed hyperlipidemia: Secondary | ICD-10-CM | POA: Diagnosis not present

## 2020-03-06 DIAGNOSIS — I82511 Chronic embolism and thrombosis of right femoral vein: Secondary | ICD-10-CM

## 2020-03-06 DIAGNOSIS — N400 Enlarged prostate without lower urinary tract symptoms: Secondary | ICD-10-CM

## 2020-03-06 DIAGNOSIS — I890 Lymphedema, not elsewhere classified: Secondary | ICD-10-CM

## 2020-03-06 MED ORDER — RIVAROXABAN 20 MG PO TABS: 20.0000 mg | ORAL_TABLET | Freq: Every day | ORAL | 9 refills | Status: DC

## 2020-03-06 NOTE — Assessment & Plan Note (Signed)
Chronic, ongoing with BP at goal today and on occasional home readings.  Continue current medication regimen and adjust as needed.  Recommend to check BP three days a week at home and document for provider + focus on DASH diet.  CMP, CBC, and TSH today.  Return in 6 months.

## 2020-03-06 NOTE — Assessment & Plan Note (Signed)
No current symptoms or medications.  He wishes to continue PSA checks, will check on labs today.

## 2020-03-06 NOTE — Assessment & Plan Note (Signed)
BMI 45.04 today.  Recommended eating smaller high protein, low fat meals more frequently and exercising 30 mins a day 5 times a week with a goal of 10-15lb weight loss in the next 3 months. Patient voiced their understanding and motivation to adhere to these recommendations.

## 2020-03-06 NOTE — Assessment & Plan Note (Signed)
Chronic, ongoing.  Continue current medication regimen and adjust as needed.  CMP and lipid panel today. 

## 2020-03-06 NOTE — Assessment & Plan Note (Signed)
History of DVT, on long term anticoag.  Refills sent today.  Continue current medication regimen and adjust as needed.  Continue collaboration with vascular as needed.

## 2020-03-06 NOTE — Assessment & Plan Note (Signed)
Secondary to A-Fib and Xarelto use.  Monitor CBC annually.  Monitor for skin breakdown or increased bruising, alert provider if present.

## 2020-03-06 NOTE — Assessment & Plan Note (Signed)
>>  ASSESSMENT AND PLAN FOR OBESITY, CLASS III, BMI 40-49.9 (MORBID OBESITY) (H[facility]) WRITTEN ON 03/06/2020  8:43 AM BY Miklo Aken T, NP  BMI 45.04 today.  Recommended eating smaller high protein, low fat meals more frequently and exercising 30 mins a day 5 times a week with a goal of 10-15lb weight loss in the next 3 months. Patient voiced their understanding and motivation to adhere to these recommendations.

## 2020-03-06 NOTE — Progress Notes (Signed)
BP 118/72 (BP Location: Left Arm, Cuff Size: Normal)   Pulse 67   Temp 98.5 F (36.9 C) (Oral)   Ht 5' 9.5" (1.765 m)   Wt (!) 309 lb 6.4 oz (140.3 kg)   SpO2 96%   BMI 45.04 kg/m    Subjective:    Patient ID: Hector Bennett, male    DOB: [birth year], 74 y.o.   MRN: [id number]  HPI: Hector Bennett is a 74 y.o. male presenting on 03/06/2020 for comprehensive medical examination. Current medical complaints include:none  He currently lives with: wife Interim Problems from his last visit: no    HYPERTENSION / HYPERLIPIDEMIA Continues on Benazepril, Atenolol, and HCTZ + Lovastatin and Xarelto which is taken for DVT right leg in 2006.  IVC filter removed August 2020.  When he goes into donut hole has to pay $147 for Xarelto -- usually goes into September.  Does not see cardiology. Satisfied with current treatment? yes Duration of hypertension: chronic BP monitoring frequency: not checking BP range:  BP medication side effects: no Duration of hyperlipidemia: chronic Cholesterol medication side effects: no Cholesterol supplements: none Medication compliance: good compliance Aspirin: no Recent stressors: no Recurrent headaches: no Visual changes: no Palpitations: no Dyspnea: no Chest pain: no Lower extremity edema: at baseline Dizzy/lightheaded: no   ATRIAL FIBRILLATION Atrial fibrillation status: stable Satisfied with current treatment: yes  Medication side effects:  no Medication compliance: good compliance Etiology of atrial fibrillation:  Palpitations:  no Chest pain:  no Dyspnea on exertion:  no Orthopnea:  no Syncope:  no Edema:  no Ventricular rate control: B-blocker Anti-coagulation: long acting  Functional Status Survey: Is the patient deaf or have difficulty hearing?: Yes Does the patient have difficulty seeing, even when wearing glasses/contacts?: Yes Does the patient have difficulty concentrating, remembering, or making decisions?: No Does the  patient have difficulty walking or climbing stairs?: No Does the patient have difficulty dressing or bathing?: No Does the patient have difficulty doing errands alone such as visiting a doctor's office or shopping?: No  FALL RISK: Fall Risk  03/06/2020 03/17/2019 08/25/2018 11/18/2017 11/17/2017  Falls in the past year? 0 0 0 No No  Number falls in past yr: 0 - - - -  Injury with Fall? 0 - - - -  Follow up Falls evaluation completed - - - -    Depression Screen Depression screen [facility] 2/9 03/06/2020 03/17/2019 11/17/2017 05/19/2017 11/14/2016  Decreased Interest 0 0 0 0 0  Down, Depressed, Hopeless 0 0 0 0 0  PHQ - 2 Score 0 0 0 0 0    Advanced Directives <no information>  Past Medical History:  Past Medical History:  Diagnosis Date  . A-fib ([place])   . Arthritis   . DVT (deep venous thrombosis) ([place]) 2007, 2009  . Dysrhythmia    af  . Hearing loss   . Hyperlipidemia   . Hypertension   . Personal history of thrombophlebitis   . Pulmonary embolism ([place]) 11/26/2007   Bilateral    Surgical History:  Past Surgical History:  Procedure Laterality Date  . CATARACT EXTRACTION W/PHACO Right 09/09/2019   Procedure: CATARACT EXTRACTION PHACO AND INTRAOCULAR LENS PLACEMENT ([place]) RIGHT;  Surgeon: Marchia Meiers, MD;  Location: [facility] ORS;  Service: Ophthalmology;  Laterality: Right;  [place] 01:07.3 CDE 8.71 Fluid Pack Lot # [id number]  . COLONOSCOPY WITH PROPOFOL N/A 10/20/2017   Procedure: COLONOSCOPY WITH PROPOFOL;  Surgeon: Jonathon Bellows, MD;  Location: [facility] ENDOSCOPY;  Service: Gastroenterology;  Laterality:  N/A;  . COLONOSCOPY WITH PROPOFOL N/A 03/02/2018   Procedure: COLONOSCOPY WITH PROPOFOL;  Surgeon: Jonathon Bellows, MD;  Location: [facility] ENDOSCOPY;  Service: Gastroenterology;  Laterality: N/A;  . HAND SURGERY Left 11/09/2014  . IVC FILTER INSERTION Right 10/05/2018   Procedure: IVC FILTER INSERTION;  Surgeon: Algernon Huxley, MD;  Location: [place] CV LAB;  Service: Cardiovascular;  Laterality: Right;   . IVC FILTER REMOVAL N/A 04/26/2019   Procedure: IVC FILTER REMOVAL;  Surgeon: Algernon Huxley, MD;  Location: [place] CV LAB;  Service: Cardiovascular;  Laterality: N/A;  . JOINT REPLACEMENT Bilateral    knee replacements  . KNEE ARTHROPLASTY Left 07/31/2015   Procedure: COMPUTER ASSISTED TOTAL KNEE ARTHROPLASTY;  Surgeon: Dereck Leep, MD;  Location: [facility] ORS;  Service: Orthopedics;  Laterality: Left;  . KNEE ARTHROPLASTY Right 10/12/2018   Procedure: COMPUTER ASSISTED TOTAL KNEE ARTHROPLASTY-RIGHT;  Surgeon: Dereck Leep, MD;  Location: [facility] ORS;  Service: Orthopedics;  Laterality: Right;  . KNEE SURGERY  1975  . PERIPHERAL VASCULAR CATHETERIZATION N/A 07/25/2015   Procedure: IVC Filter Insertion;  Surgeon: Katha Cabal, MD;  Location: [place] CV LAB;  Service: Cardiovascular;  Laterality: N/A;  . PERIPHERAL VASCULAR CATHETERIZATION N/A 08/29/2015   Procedure: IVC Filter Removal;  Surgeon: Katha Cabal, MD;  Location: [place] CV LAB;  Service: Cardiovascular;  Laterality: N/A;    Medications:  Current Outpatient Medications on File Prior to Visit  Medication Sig  . acetaminophen (RA ACETAMINOPHEN) 650 MG CR tablet Take 650 mg by mouth every 8 (eight) hours as needed.  Marland Kitchen atenolol (TENORMIN) 50 MG tablet Take 1 tablet (50 mg total) by mouth every morning.  . benazepril (LOTENSIN) 20 MG tablet Take 1 tablet (20 mg total) by mouth daily.  . hydrochlorothiazide (HYDRODIURIL) 25 MG tablet Take 1 tablet (25 mg total) by mouth daily.  Marland Kitchen latanoprost (XALATAN) 0.005 % ophthalmic solution Place 1 drop into both eyes at bedtime.  . lovastatin (MEVACOR) 20 MG tablet Take 1 tablet (20 mg total) by mouth at bedtime.   No current facility-administered medications on file prior to visit.    Allergies:  No Known Allergies  Social History:  Social History   Socioeconomic History  . Marital status: Married    Spouse name: Not on file  . Number of children: Not on file    . Years of education: Not on file  . Highest education level: High school graduate  Occupational History  . Occupation: retired  Tobacco Use  . Smoking status: Former Smoker    Types: Cigarettes  . Smokeless tobacco: Never Used  . Tobacco comment: quit 40 years ago   Vaping Use  . Vaping Use: Never used  Substance and Sexual Activity  . Alcohol use: Yes    Comment: occasional  . Drug use: No  . Sexual activity: Not on file  Other Topics Concern  . Not on file  Social History Narrative  . Not on file   Social Determinants of Health   Financial Resource Strain:   . Difficulty of Paying Living Expenses:   Food Insecurity:   . Worried About Charity fundraiser in the Last Year:   . Arboriculturist in the Last Year:   Transportation Needs:   . Film/video editor (Medical):   Marland Kitchen Lack of Transportation (Non-Medical):   Physical Activity:   . Days of Exercise per Week:   . Minutes of Exercise per Session:   Stress:   .  Feeling of Stress :   Social Connections:   . Frequency of Communication with Friends and Family:   . Frequency of Social Gatherings with Friends and Family:   . Attends Religious Services:   . Active Member of Clubs or Organizations:   . Attends Archivist Meetings:   Marland Kitchen Marital Status:   Intimate Partner Violence:   . Fear of Current or Ex-Partner:   . Emotionally Abused:   Marland Kitchen Physically Abused:   . Sexually Abused:    Social History   Tobacco Use  Smoking Status Former Smoker  . Types: Cigarettes  Smokeless Tobacco Never Used  Tobacco Comment   quit 40 years ago    Social History   Substance and Sexual Activity  Alcohol Use Yes   Comment: occasional    Family History:  Family History  Problem Relation Age of Onset  . Hypertension Mother   . Hypertension Brother   . Diabetes Brother   . Dementia Brother     Past medical history, surgical history, medications, allergies, family history and social history reviewed with  patient today and changes made to appropriate areas of the chart.   Review of Systems - negative All other ROS negative except what is listed above and in the HPI.      Objective:    BP 118/72 (BP Location: Left Arm, Cuff Size: Normal)   Pulse 67   Temp 98.5 F (36.9 C) (Oral)   Ht 5' 9.5" (1.765 m)   Wt (!) 309 lb 6.4 oz (140.3 kg)   SpO2 96%   BMI 45.04 kg/m   Wt Readings from Last 3 Encounters:  03/06/20 (!) 309 lb 6.4 oz (140.3 kg)  12/13/19 (!) 318 lb 6 oz (144.4 kg)  08/30/19 (!) 305 lb (138.3 kg)    Physical Exam Vitals and nursing note reviewed.  Constitutional:      General: He is awake. He is not in acute distress.    Appearance: He is well-developed and well-groomed. He is morbidly obese. He is not ill-appearing.  HENT:     Head: Normocephalic and atraumatic.     Right Ear: Hearing, tympanic membrane, ear canal and external ear normal. No drainage.     Left Ear: Hearing, tympanic membrane, ear canal and external ear normal. No drainage.     Nose: Nose normal.     Mouth/Throat:     Pharynx: Uvula midline.  Eyes:     General: Lids are normal.        Right eye: No discharge.        Left eye: No discharge.     Extraocular Movements: Extraocular movements intact.     Conjunctiva/sclera: Conjunctivae normal.     Pupils: Pupils are equal, round, and reactive to light.     Visual Fields: Right eye visual fields normal and left eye visual fields normal.  Neck:     Thyroid: No thyromegaly.     Vascular: No carotid bruit or JVD.     Trachea: Trachea normal.  Cardiovascular:     Rate and Rhythm: Normal rate and regular rhythm.     Heart sounds: Normal heart sounds, S1 normal and S2 normal. No murmur heard.  No gallop.   Pulmonary:     Effort: Pulmonary effort is normal. No accessory muscle usage or respiratory distress.     Breath sounds: Normal breath sounds.  Abdominal:     General: Bowel sounds are normal.     Palpations: Abdomen is soft.  There is no  hepatomegaly or splenomegaly.     Tenderness: There is no abdominal tenderness.  Musculoskeletal:        General: Normal range of motion.     Cervical back: Normal range of motion and neck supple.     Right lower leg: 1+ Edema present.     Left lower leg: 1+ Edema present.  Lymphadenopathy:     Head:     Right side of head: No submental, submandibular, tonsillar, preauricular or posterior auricular adenopathy.     Left side of head: No submental, submandibular, tonsillar, preauricular or posterior auricular adenopathy.     Cervical: No cervical adenopathy.  Skin:    General: Skin is warm and dry.     Capillary Refill: Capillary refill takes less than 2 seconds.     Findings: No rash.     Comments: Hemosiderin staining bilateral lower extremities, skin intact.    Neurological:     Mental Status: He is alert and oriented to person, place, and time.     Cranial Nerves: Cranial nerves are intact.     Gait: Gait is intact.     Deep Tendon Reflexes: Reflexes are normal and symmetric.     Reflex Scores:      Brachioradialis reflexes are 2+ on the right side and 2+ on the left side.      Patellar reflexes are 2+ on the right side and 2+ on the left side. Psychiatric:        Attention and Perception: Attention normal.        Mood and Affect: Mood normal.        Speech: Speech normal.        Behavior: Behavior normal. Behavior is cooperative.        Thought Content: Thought content normal.        Cognition and Memory: Cognition normal.        Judgment: Judgment normal.    Results for orders placed or performed during the hospital encounter of 09/06/19  SARS CORONAVIRUS 2 (TAT 6-24 HRS) Nasopharyngeal Nasopharyngeal Swab   Specimen: Nasopharyngeal Swab  Result Value Ref Range   SARS Coronavirus 2 NEGATIVE NEGATIVE      Assessment & Plan:   Problem List Items Addressed This Visit      Cardiovascular and Mediastinum   Hypertension    Chronic, ongoing with BP at goal today and on  occasional home readings.  Continue current medication regimen and adjust as needed.  Recommend to check BP three days a week at home and document for provider + focus on DASH diet.  CMP, CBC, and TSH today.  Return in 6 months.      Relevant Medications   rivaroxaban (XARELTO) 20 MG TABS tablet   Other Relevant Orders   CBC with Differential/Platelet   Comprehensive metabolic panel   TSH   A-fib (HCC)    Chronic, ongoing.  On review recent EKG noting NSR in 2020.  Continue current medication regimen and adjust as needed.  Rate-controlled.  Will plan to refer to cardiology if symptoms or rate change presents.  Labs today, include CBC.      Relevant Medications   rivaroxaban (XARELTO) 20 MG TABS tablet   DVT (deep venous thrombosis) (HCC)    History of DVT, on long term anticoag.  Refills sent today.  Continue current medication regimen and adjust as needed.  Continue collaboration with vascular as needed.      Relevant Medications   rivaroxaban (XARELTO)  20 MG TABS tablet   Chronic venous insufficiency    Chronic, ongoing.  Recommend continued use of Xarelto and statin.  Monitor skin for breakdown and alert provider if present.  Wear compression hose daily at home, on during day and off in evening.      Relevant Medications   rivaroxaban (XARELTO) 20 MG TABS tablet     Genitourinary   BPH (benign prostatic hyperplasia)    No current symptoms or medications.  He wishes to continue PSA checks, will check on labs today.      Relevant Orders   PSA     Hematopoietic and Hemostatic   Other thrombophilia ([place])    Secondary to A-Fib and Xarelto use.  Monitor CBC annually.  Monitor for skin breakdown or increased bruising, alert provider if present.        Other   Hyperlipidemia    Chronic, ongoing.  Continue current medication regimen and adjust as needed. CMP and lipid panel today.      Relevant Medications   rivaroxaban (XARELTO) 20 MG TABS tablet   Other Relevant Orders    Lipid Panel w/o Chol/HDL Ratio   Obesity, Class III, BMI 40-49.9 (morbid obesity) (HCC)    BMI 45.04 today.  Recommended eating smaller high protein, low fat meals more frequently and exercising 30 mins a day 5 times a week with a goal of 10-15lb weight loss in the next 3 months. Patient voiced their understanding and motivation to adhere to these recommendations.       Lymphedema    Chronic, ongoing.  Recommend continued use of Xarelto and statin.  Monitor skin for breakdown and alert provider if present.  Wear compression hose daily at home, on during day and off in evening.       Other Visit Diagnoses    Routine general medical examination at a health care facility    -  Primary       Discussed aspirin prophylaxis for myocardial infarction prevention and decision was it was not indicated  LABORATORY TESTING:  Health maintenance labs ordered today as discussed above.   The natural history of prostate cancer and ongoing controversy regarding screening and potential treatment outcomes of prostate cancer has been discussed with the patient. The meaning of a false positive PSA and a false negative PSA has been discussed. He indicates understanding of the limitations of this screening test and wishes to proceed with screening PSA testing.   IMMUNIZATIONS:   - Tdap: Tetanus vaccination status reviewed: Td vaccination indicated and given today. - Influenza: Up to date - Pneumovax: Up to date - Prevnar: Up to date - Zostavax vaccine: Refused  SCREENING: - Colonoscopy: Not applicable  Discussed with patient purpose of the colonoscopy is to detect colon cancer at curable precancerous or early stages   - AAA Screening: Not applicable  -Hearing Test: Not applicable  -Spirometry: Not applicable   PATIENT COUNSELING:    Sexuality: Discussed sexually transmitted diseases, partner selection, use of condoms, avoidance of unintended pregnancy  and contraceptive alternatives.   Advised to  avoid cigarette smoking.  I discussed with the patient that most people either abstain from alcohol or drink within safe limits (<=14/week and <=4 drinks/occasion for males, <=7/weeks and <= 3 drinks/occasion for females) and that the risk for alcohol disorders and other health effects rises proportionally with the number of drinks per week and how often a drinker exceeds daily limits.  Discussed cessation/primary prevention of drug use and availability of treatment for  abuse.   Diet: Encouraged to adjust caloric intake to maintain  or achieve ideal body weight, to reduce intake of dietary saturated fat and total fat, to limit sodium intake by avoiding high sodium foods and not adding table salt, and to maintain adequate dietary potassium and calcium preferably from fresh fruits, vegetables, and low-fat dairy products.    stressed the importance of regular exercise  Injury prevention: Discussed safety belts, safety helmets, smoke detector, smoking near bedding or upholstery.   Dental health: Discussed importance of regular tooth brushing, flossing, and dental visits.   Follow up plan: NEXT PREVENTATIVE PHYSICAL DUE IN 1 YEAR. Return in about 6 months (around 09/06/2020) for HTN/HLD, A-Fib.

## 2020-03-06 NOTE — Assessment & Plan Note (Signed)
Chronic, ongoing.  On review recent EKG noting NSR in 2020.  Continue current medication regimen and adjust as needed.  Rate-controlled.  Will plan to refer to cardiology if symptoms or rate change presents.  Labs today, include CBC.

## 2020-03-06 NOTE — Assessment & Plan Note (Signed)
Chronic, ongoing.  Recommend continued use of Xarelto and statin.  Monitor skin for breakdown and alert provider if present.  Wear compression hose daily at home, on during day and off in evening.

## 2020-03-06 NOTE — Patient Instructions (Signed)
Healthy Eating Following a healthy eating pattern may help you to achieve and maintain a healthy body weight, reduce the risk of chronic disease, and live a long and productive life. It is important to follow a healthy eating pattern at an appropriate calorie level for your body. Your nutritional needs should be met primarily through food by choosing a variety of nutrient-rich foods. What are tips for following this plan? Reading food labels  Read labels and choose the following: ? Reduced or low sodium. ? Juices with 100% fruit juice. ? Foods with low saturated fats and high polyunsaturated and monounsaturated fats. ? Foods with whole grains, such as whole wheat, cracked wheat, brown rice, and wild rice. ? Whole grains that are fortified with folic acid. This is recommended for women who are pregnant or who want to become pregnant.  Read labels and avoid the following: ? Foods with a lot of added sugars. These include foods that contain brown sugar, corn sweetener, corn syrup, dextrose, fructose, glucose, high-fructose corn syrup, honey, invert sugar, lactose, malt syrup, maltose, molasses, raw sugar, sucrose, trehalose, or turbinado sugar.  Do not eat more than the following amounts of added sugar per day:  6 teaspoons (25 g) for women.  9 teaspoons (38 g) for men. ? Foods that contain processed or refined starches and grains. ? Refined grain products, such as white flour, degermed cornmeal, white bread, and white rice. Shopping  Choose nutrient-rich snacks, such as vegetables, whole fruits, and nuts. Avoid high-calorie and high-sugar snacks, such as potato chips, fruit snacks, and candy.  Use oil-based dressings and spreads on foods instead of solid fats such as butter, stick margarine, or cream cheese.  Limit pre-made sauces, mixes, and "instant" products such as flavored rice, instant noodles, and ready-made pasta.  Try more plant-protein sources, such as tofu, tempeh, black beans,  edamame, lentils, nuts, and seeds.  Explore eating plans such as the Mediterranean diet or vegetarian diet. Cooking  Use oil to saut or stir-fry foods instead of solid fats such as butter, stick margarine, or lard.  Try baking, boiling, grilling, or broiling instead of frying.  Remove the fatty part of meats before cooking.  Steam vegetables in water or broth. Meal planning   At meals, imagine dividing your plate into fourths: ? One-half of your plate is fruits and vegetables. ? One-fourth of your plate is whole grains. ? One-fourth of your plate is protein, especially lean meats, poultry, eggs, tofu, beans, or nuts.  Include low-fat dairy as part of your daily diet. Lifestyle  Choose healthy options in all settings, including home, work, school, restaurants, or stores.  Prepare your food safely: ? Wash your hands after handling raw meats. ? Keep food preparation surfaces clean by regularly washing with hot, soapy water. ? Keep raw meats separate from ready-to-eat foods, such as fruits and vegetables. ? Cook seafood, meat, poultry, and eggs to the recommended internal temperature. ? Store foods at safe temperatures. In general:  Keep cold foods at 59F (4.4C) or below.  Keep hot foods at 159F (60C) or above.  Keep your freezer at [facility] (-17.8C) or below.  Foods are no longer safe to eat when they have been between the temperatures of 40-159F (4.4-60C) for more than 2 hours. What foods should I eat? Fruits Aim to eat 2 cup-equivalents of fresh, canned (in natural juice), or frozen fruits each day. Examples of 1 cup-equivalent of fruit include 1 small apple, 8 large strawberries, 1 cup canned fruit,  cup  dried fruit, or 1 cup 100% juice. Vegetables Aim to eat 2-3 cup-equivalents of fresh and frozen vegetables each day, including different varieties and colors. Examples of 1 cup-equivalent of vegetables include 2 medium carrots, 2 cups raw, leafy greens, 1 cup chopped  vegetable (raw or cooked), or 1 medium baked potato. Grains Aim to eat 6 ounce-equivalents of whole grains each day. Examples of 1 ounce-equivalent of grains include 1 slice of bread, 1 cup ready-to-eat cereal, 3 cups popcorn, or  cup cooked rice, pasta, or cereal. Meats and other proteins Aim to eat 5-6 ounce-equivalents of protein each day. Examples of 1 ounce-equivalent of protein include 1 egg, 1/2 cup nuts or seeds, or 1 tablespoon (16 g) peanut butter. A cut of meat or fish that is the size of a deck of cards is about 3-4 ounce-equivalents.  Of the protein you eat each week, try to have at least 8 ounces come from seafood. This includes salmon, trout, herring, and anchovies. Dairy Aim to eat 3 cup-equivalents of fat-free or low-fat dairy each day. Examples of 1 cup-equivalent of dairy include 1 cup (240 mL) milk, 8 ounces (250 g) yogurt, 1 ounces (44 g) natural cheese, or 1 cup (240 mL) fortified soy milk. Fats and oils  Aim for about 5 teaspoons (21 g) per day. Choose monounsaturated fats, such as canola and olive oils, avocados, peanut butter, and most nuts, or polyunsaturated fats, such as sunflower, corn, and soybean oils, walnuts, pine nuts, sesame seeds, sunflower seeds, and flaxseed. Beverages  Aim for six 8-oz glasses of water per day. Limit coffee to three to five 8-oz cups per day.  Limit caffeinated beverages that have added calories, such as soda and energy drinks.  Limit alcohol intake to no more than 1 drink a day for nonpregnant women and 2 drinks a day for men. One drink equals 12 oz of beer (355 mL), 5 oz of wine (148 mL), or 1 oz of hard liquor (44 mL). Seasoning and other foods  Avoid adding excess amounts of salt to your foods. Try flavoring foods with herbs and spices instead of salt.  Avoid adding sugar to foods.  Try using oil-based dressings, sauces, and spreads instead of solid fats. This information is based on general [place] nutrition guidelines. For more  information, visit [employer]. Exact amounts may vary based on your nutrition needs. Summary  A healthy eating plan may help you to maintain a healthy weight, reduce the risk of chronic diseases, and stay active throughout your life.  Plan your meals. Make sure you eat the right portions of a variety of nutrient-rich foods.  Try baking, boiling, grilling, or broiling instead of frying.  Choose healthy options in all settings, including home, work, school, restaurants, or stores. This information is not intended to replace advice given to you by your health care provider. Make sure you discuss any questions you have with your health care provider. Document Revised: 11/24/2017 Document Reviewed: 11/24/2017 Elsevier Patient Education  [place].

## 2020-03-07 LAB — COMPREHENSIVE METABOLIC PANEL
ALT: 10 IU/L (ref 0–44)
AST: 19 IU/L (ref 0–40)
Albumin/Globulin Ratio: 1.5 (ref 1.2–2.2)
Albumin: 4 g/dL (ref 3.7–4.7)
Alkaline Phosphatase: 76 IU/L (ref 48–121)
BUN/Creatinine Ratio: 8 — ABNORMAL LOW (ref 10–24)
BUN: 9 mg/dL (ref 8–27)
Bilirubin Total: 0.8 mg/dL (ref 0.0–1.2)
CO2: 23 mmol/L (ref 20–29)
Calcium: 9.4 mg/dL (ref 8.6–10.2)
Chloride: 104 mmol/L (ref 96–106)
Creatinine, Ser: 1.11 mg/dL (ref 0.76–1.27)
GFR calc Af Amer: 74 mL/min/{1.73_m2} (ref >59)
GFR calc non Af Amer: 64 mL/min/{1.73_m2} (ref >59)
Globulin, Total: 2.7 g/dL (ref 1.5–4.5)
Glucose: 124 mg/dL — ABNORMAL HIGH (ref 65–99)
Potassium: 5.1 mmol/L (ref 3.5–5.2)
Sodium: 140 mmol/L (ref 134–144)
Total Protein: 6.7 g/dL (ref 6.0–8.5)

## 2020-03-07 LAB — CBC WITH DIFFERENTIAL/PLATELET
Basophils Absolute: 0 10*3/uL (ref 0.0–0.2)
Basos: 0 %
EOS (ABSOLUTE): 0.1 10*3/uL (ref 0.0–0.4)
Eos: 2 %
Hematocrit: 47.1 % (ref 37.5–51.0)
Hemoglobin: 15.5 g/dL (ref 13.0–17.7)
Immature Grans (Abs): 0 10*3/uL (ref 0.0–0.1)
Immature Granulocytes: 0 %
Lymphocytes Absolute: 0.9 10*3/uL (ref 0.7–3.1)
Lymphs: 12 %
MCH: 30.3 pg (ref 26.6–33.0)
MCHC: 32.9 g/dL (ref 31.5–35.7)
MCV: 92 fL (ref 79–97)
Monocytes Absolute: 0.4 10*3/uL (ref 0.1–0.9)
Monocytes: 6 %
Neutrophils Absolute: 6 10*3/uL (ref 1.4–7.0)
Neutrophils: 80 %
Platelets: 216 10*3/uL (ref 150–450)
RBC: 5.12 x10E6/uL (ref 4.14–5.80)
RDW: 13.7 % (ref 11.6–15.4)
WBC: 7.6 10*3/uL (ref 3.4–10.8)

## 2020-03-07 LAB — LIPID PANEL W/O CHOL/HDL RATIO
Cholesterol, Total: 134 mg/dL (ref 100–199)
HDL: 46 mg/dL (ref >39)
LDL Chol Calc (NIH): 74 mg/dL (ref 0–99)
Triglycerides: 71 mg/dL (ref 0–149)
VLDL Cholesterol Cal: 14 mg/dL (ref 5–40)

## 2020-03-07 LAB — PSA: Prostate Specific Ag, Serum: 0.5 ng/mL (ref 0.0–4.0)

## 2020-03-07 LAB — TSH: TSH: 3.09 u[IU]/mL (ref 0.450–4.500)

## 2020-03-07 NOTE — Progress Notes (Signed)
Please let [name] know his labs have returned and overall they look good.  No anemia.  Kidney and liver function are stable.  Cholesterol, prostate, and thyroid labs look great.  Sugar was a little elevated, but I recall him saying he did eat prior to visit.  We will continue to monitor this.  Continue current medications and have a great day!!

## 2020-03-29 ENCOUNTER — Telehealth: Payer: Self-pay | Admitting: Nurse Practitioner

## 2020-03-29 NOTE — Telephone Encounter (Signed)
Copied from [place] [id number]. Topic: Medicare AWV >> Mar 29, 2020 11:25 AM Cher Nakai R wrote: Reason for CRM:  Left message for patient to call back and schedule the Medicare Annual Wellness Visit (AWV) virtually.  Last AWV 03/17/2019  Please schedule at anytime with CFP-Nurse Health Advisor.  45 minute appointment  Any questions, please call me at [phone]

## 2020-04-17 DIAGNOSIS — H40003 Preglaucoma, unspecified, bilateral: Secondary | ICD-10-CM | POA: Diagnosis not present

## 2020-04-26 ENCOUNTER — Other Ambulatory Visit: Payer: Self-pay

## 2020-04-26 ENCOUNTER — Encounter: Payer: Self-pay | Admitting: Emergency Medicine

## 2020-04-26 ENCOUNTER — Ambulatory Visit
Admission: EM | Admit: 2020-04-26 | Discharge: 2020-04-26 | Disposition: A | Discharge status: Other Acute Inpatient Hospital | Payer: Medicare Other | Attending: Emergency Medicine | Admitting: Emergency Medicine

## 2020-04-26 ENCOUNTER — Ambulatory Visit (INDEPENDENT_AMBULATORY_CARE_PROVIDER_SITE_OTHER): Payer: Medicare Other

## 2020-04-26 DIAGNOSIS — J189 Pneumonia, unspecified organism: Secondary | ICD-10-CM | POA: Diagnosis present

## 2020-04-26 DIAGNOSIS — R42 Dizziness and giddiness: Secondary | ICD-10-CM | POA: Diagnosis not present

## 2020-04-26 DIAGNOSIS — I4891 Unspecified atrial fibrillation: Secondary | ICD-10-CM | POA: Insufficient documentation

## 2020-04-26 DIAGNOSIS — I10 Essential (primary) hypertension: Secondary | ICD-10-CM | POA: Diagnosis not present

## 2020-04-26 DIAGNOSIS — R0602 Shortness of breath: Secondary | ICD-10-CM | POA: Diagnosis not present

## 2020-04-26 DIAGNOSIS — R5383 Other fatigue: Secondary | ICD-10-CM | POA: Insufficient documentation

## 2020-04-26 DIAGNOSIS — R918 Other nonspecific abnormal finding of lung field: Secondary | ICD-10-CM | POA: Diagnosis not present

## 2020-04-26 DIAGNOSIS — Z86711 Personal history of pulmonary embolism: Secondary | ICD-10-CM | POA: Diagnosis not present

## 2020-04-26 DIAGNOSIS — R590 Localized enlarged lymph nodes: Secondary | ICD-10-CM | POA: Diagnosis not present

## 2020-04-26 DIAGNOSIS — Z7901 Long term (current) use of anticoagulants: Secondary | ICD-10-CM | POA: Insufficient documentation

## 2020-04-26 DIAGNOSIS — W2209XA Striking against other stationary object, initial encounter: Secondary | ICD-10-CM | POA: Diagnosis not present

## 2020-04-26 DIAGNOSIS — R05 Cough: Secondary | ICD-10-CM

## 2020-04-26 DIAGNOSIS — M1990 Unspecified osteoarthritis, unspecified site: Secondary | ICD-10-CM | POA: Diagnosis not present

## 2020-04-26 DIAGNOSIS — S0181XA Laceration without foreign body of other part of head, initial encounter: Secondary | ICD-10-CM | POA: Insufficient documentation

## 2020-04-26 DIAGNOSIS — Y929 Unspecified place or not applicable: Secondary | ICD-10-CM | POA: Insufficient documentation

## 2020-04-26 DIAGNOSIS — R600 Localized edema: Secondary | ICD-10-CM | POA: Diagnosis not present

## 2020-04-26 DIAGNOSIS — S0990XD Unspecified injury of head, subsequent encounter: Secondary | ICD-10-CM | POA: Diagnosis not present

## 2020-04-26 DIAGNOSIS — E785 Hyperlipidemia, unspecified: Secondary | ICD-10-CM | POA: Insufficient documentation

## 2020-04-26 DIAGNOSIS — Y939 Activity, unspecified: Secondary | ICD-10-CM | POA: Diagnosis not present

## 2020-04-26 DIAGNOSIS — Z20822 Contact with and (suspected) exposure to covid-19: Secondary | ICD-10-CM | POA: Diagnosis not present

## 2020-04-26 DIAGNOSIS — E7800 Pure hypercholesterolemia, unspecified: Secondary | ICD-10-CM | POA: Diagnosis not present

## 2020-04-26 DIAGNOSIS — J1282 Pneumonia due to coronavirus disease 2019: Secondary | ICD-10-CM | POA: Diagnosis not present

## 2020-04-26 DIAGNOSIS — Z86718 Personal history of other venous thrombosis and embolism: Secondary | ICD-10-CM | POA: Diagnosis not present

## 2020-04-26 DIAGNOSIS — S0990XA Unspecified injury of head, initial encounter: Secondary | ICD-10-CM | POA: Diagnosis not present

## 2020-04-26 DIAGNOSIS — Z79899 Other long term (current) drug therapy: Secondary | ICD-10-CM | POA: Insufficient documentation

## 2020-04-26 DIAGNOSIS — W19XXXA Unspecified fall, initial encounter: Secondary | ICD-10-CM | POA: Insufficient documentation

## 2020-04-26 DIAGNOSIS — Z87891 Personal history of nicotine dependence: Secondary | ICD-10-CM | POA: Diagnosis not present

## 2020-04-26 DIAGNOSIS — R001 Bradycardia, unspecified: Secondary | ICD-10-CM | POA: Diagnosis not present

## 2020-04-26 DIAGNOSIS — U071 COVID-19: Secondary | ICD-10-CM | POA: Diagnosis not present

## 2020-04-26 DIAGNOSIS — R531 Weakness: Secondary | ICD-10-CM | POA: Diagnosis not present

## 2020-04-26 LAB — SARS CORONAVIRUS 2 (TAT 6-24 HRS): SARS Coronavirus 2: POSITIVE — AB

## 2020-04-26 NOTE — Discharge Instructions (Addendum)
You have bilateral pneumonia.  I am concerned that it could be Covid.  Your wife will not be able to go into the emergency department with you because she has Covid currently.  I suspect that you will be admitted to the hospital for medications And further management . you at least need comprehensive labs and imaging.  Go immediately to the emergency department.  I have talked to your wife and she agrees to take you there.

## 2020-04-26 NOTE — ED Notes (Signed)
Oxygen saturation checked walking back from Xray. 85% on RA. Dr. Alphonzo Cruise notified.

## 2020-04-26 NOTE — ED Triage Notes (Addendum)
Patient c/o cough x several weeks. Patient states he got dizzy and fell into the door hitting his forehead. He states he has been weak and not eating. Wife wants to check to make sure he doesn't have pneumonia. Wife was positive for COVID last Thursday.

## 2020-04-26 NOTE — ED Provider Notes (Signed)
HPI  SUBJECTIVE:  Hector Bennett is a 74 y.o. male who presents with intermittent lightheadedness for the past several days.  He reports generalized weakness, decreased appetite.  He also reports a cough.  His wife currently has Covid.  He denies fevers, body aches, headaches, nasal congestion or rhinorrhea, loss of smell or taste, chest pain, wheezing, shortness of breath, nausea, vomiting or diarrhea abdominal pain.  He did not get the Covid vaccine.  He denies change in urine output, urinary complaints.  He also states that he had a fall 4 days ago.  He was getting up to use the bathroom, became dizzy, lightheaded and weak, and fell, hitting his forehead on the corner of a door.  He denies loss of consciousness, visual changes, headache, extremity weakness, facial droop, slurred speech.  No antibiotics in the past 3 months.  No antipyretic in the past 6 hours.  He tried resting, pushing fluids and taking Tylenol.  The Tylenol helps with his symptoms.  No aggravating factors.  He has an extensive medical history including atrial fibrillation, DVT/PE on Xarelto, hypertension, hypercholesterolemia.  No history of diabetes, syncope, chronic kidney disease, HIV, cancer, immunocompromise coronary artery disease, MI, pulmonary disease, smoking.  PMD: [facility].    Past Medical History:  Diagnosis Date  . A-fib ([employer])   . Arthritis   . DVT (deep venous thrombosis) ([place]) 2007, 2009  . Dysrhythmia    af  . Hearing loss   . Hyperlipidemia   . Hypertension   . Personal history of thrombophlebitis   . Pulmonary embolism ([place]) 11/26/2007   Bilateral    Past Surgical History:  Procedure Laterality Date  . CATARACT EXTRACTION W/PHACO Right 09/09/2019   Procedure: CATARACT EXTRACTION PHACO AND INTRAOCULAR LENS PLACEMENT ([place]) RIGHT;  Surgeon: Marchia Meiers, MD;  Location: [facility] ORS;  Service: Ophthalmology;  Laterality: Right;  [place] 01:07.3 CDE 8.71 Fluid Pack Lot # [id number]  .  COLONOSCOPY WITH PROPOFOL N/A 10/20/2017   Procedure: COLONOSCOPY WITH PROPOFOL;  Surgeon: Jonathon Bellows, MD;  Location: [facility] ENDOSCOPY;  Service: Gastroenterology;  Laterality: N/A;  . COLONOSCOPY WITH PROPOFOL N/A 03/02/2018   Procedure: COLONOSCOPY WITH PROPOFOL;  Surgeon: Jonathon Bellows, MD;  Location: [facility] ENDOSCOPY;  Service: Gastroenterology;  Laterality: N/A;  . HAND SURGERY Left 11/09/2014  . IVC FILTER INSERTION Right 10/05/2018   Procedure: IVC FILTER INSERTION;  Surgeon: Algernon Huxley, MD;  Location: [place] CV LAB;  Service: Cardiovascular;  Laterality: Right;  . IVC FILTER REMOVAL N/A 04/26/2019   Procedure: IVC FILTER REMOVAL;  Surgeon: Algernon Huxley, MD;  Location: [place] CV LAB;  Service: Cardiovascular;  Laterality: N/A;  . JOINT REPLACEMENT Bilateral    knee replacements  . KNEE ARTHROPLASTY Left 07/31/2015   Procedure: COMPUTER ASSISTED TOTAL KNEE ARTHROPLASTY;  Surgeon: Dereck Leep, MD;  Location: [facility] ORS;  Service: Orthopedics;  Laterality: Left;  . KNEE ARTHROPLASTY Right 10/12/2018   Procedure: COMPUTER ASSISTED TOTAL KNEE ARTHROPLASTY-RIGHT;  Surgeon: Dereck Leep, MD;  Location: [facility] ORS;  Service: Orthopedics;  Laterality: Right;  . KNEE SURGERY  1975  . PERIPHERAL VASCULAR CATHETERIZATION N/A 07/25/2015   Procedure: IVC Filter Insertion;  Surgeon: Katha Cabal, MD;  Location: [place] CV LAB;  Service: Cardiovascular;  Laterality: N/A;  . PERIPHERAL VASCULAR CATHETERIZATION N/A 08/29/2015   Procedure: IVC Filter Removal;  Surgeon: Katha Cabal, MD;  Location: [place] CV LAB;  Service: Cardiovascular;  Laterality: N/A;    Family History  Problem  Relation Age of Onset  . Hypertension Mother   . Hypertension Brother   . Diabetes Brother   . Dementia Brother     Social History   Tobacco Use  . Smoking status: Former Smoker    Types: Cigarettes  . Smokeless tobacco: Never Used  . Tobacco comment: quit 40 years ago   Vaping Use  .  Vaping Use: Never used  Substance Use Topics  . Alcohol use: Yes    Comment: occasional  . Drug use: No    No current facility-administered medications for this encounter.  Current Outpatient Medications:  .  acetaminophen (RA ACETAMINOPHEN) 650 MG CR tablet, Take 650 mg by mouth every 8 (eight) hours as needed., Disp: , Rfl:  .  atenolol (TENORMIN) 50 MG tablet, Take 1 tablet (50 mg total) by mouth every morning., Disp: 90 tablet, Rfl: 4 .  benazepril (LOTENSIN) 20 MG tablet, Take 1 tablet (20 mg total) by mouth daily., Disp: 90 tablet, Rfl: 4 .  hydrochlorothiazide (HYDRODIURIL) 25 MG tablet, Take 1 tablet (25 mg total) by mouth daily., Disp: 90 tablet, Rfl: 4 .  latanoprost (XALATAN) 0.005 % ophthalmic solution, Place 1 drop into both eyes at bedtime., Disp: , Rfl:  .  lovastatin (MEVACOR) 20 MG tablet, Take 1 tablet (20 mg total) by mouth at bedtime., Disp: 90 tablet, Rfl: 4 .  rivaroxaban (XARELTO) 20 MG TABS tablet, Take 1 tablet (20 mg total) by mouth daily with supper., Disp: 30 tablet, Rfl: 9  No Known Allergies   ROS  As noted in HPI.   Physical Exam  BP 130/78 (BP Location: Right Arm)   Pulse 80   Temp 98.4 F (36.9 C) (Oral)   Resp 18   Ht 6' (1.829 m)   Wt (!) 137 kg   SpO2 94%   BMI 40.96 kg/m   Constitutional: Well developed, well nourished, no acute distress Eyes: PERRL, EOMI, conjunctiva normal bilaterally HENT: Normocephalic, 2 healing lacerations on the right forehead,mucus membranes moist.  No periorbital ecchymosis, negative battle sign.  No hemotympanum. Respiratory: Clear to auscultation bilaterally, no rales, no wheezing, no rhonchi Cardiovascular: Irregularly irregular no murmurs, no gallops, no rubs GI: Soft, nondistended, normal bowel sounds, nontender, no rebound, no guarding Back: No C-spine, T-spine, L-spine tenderness.  No CVAT. skin: No rash, skin intact Musculoskeletal: No edema, no tenderness, no deformities Neurologic: Alert & oriented  x 3, CN III-XII grossly intact, no motor deficits, sensation grossly intact Psychiatric: Speech and behavior appropriate   ED Course   Medications - No data to display  Orders Placed This Encounter  Procedures  . SARS CORONAVIRUS 2 (TAT 6-24 HRS) Nasopharyngeal Nasopharyngeal Swab    Standing Status:   Standing    Number of Occurrences:   1    Order Specific Question:   Is this test for diagnosis or screening    Answer:   Diagnosis of ill patient    Order Specific Question:   Symptomatic for COVID-19 as defined by CDC    Answer:   Yes    Order Specific Question:   Date of Symptom Onset    Answer:   04/24/2020    Order Specific Question:   Hospitalized for COVID-19    Answer:   No    Order Specific Question:   Admitted to ICU for COVID-19    Answer:   No    Order Specific Question:   Previously tested for COVID-19    Answer:   Yes  Order Specific Question:   Resident in a congregate (group) care setting    Answer:   No    Order Specific Question:   Employed in healthcare setting    Answer:   No    Order Specific Question:   Has patient completed COVID vaccination(s) (2 doses of Pfizer/Moderna 1 dose of [employer])    Answer:   No  . DG Chest 2 View    Standing Status:   Standing    Number of Occurrences:   1    Order Specific Question:   Reason for Exam (SYMPTOM  OR DIAGNOSIS REQUIRED)    Answer:   COVID exposure, cough, fatigue   No results found for this or any previous visit (from the past 24 hour(s)). DG Chest 2 View  Result Date: 04/26/2020 CLINICAL DATA:  COVID exposure with cough and fatigue EXAM: CHEST - 2 VIEW COMPARISON:  Chest CT 11/27/2007 FINDINGS: Patchy bilateral airspace disease. Mild atelectasis or scarring in the right lung along the minor fissure. No edema, effusion, or pneumothorax. Normal heart size. Aortic tortuosity. IMPRESSION: Bilateral pneumonia. Electronically Signed   By: Monte Fantasia M.D.   On: 04/26/2020 09:08    ED Clinical  Impression  1. Pneumonia of both lower lobes due to infectious organism   2. Suspected COVID-19 virus infection   3. Fall, initial encounter   4. Laceration of forehead, initial encounter      ED Assessment/Plan  Chest x-ray: Reviewed imaging independently.  Bilateral pneumonia.  See radiology report for details.  Patient is satting 94% at rest, drops to 85% with walking.  His chest x-ray shows bilateral pneumonia.  Given close exposure to Covid, suspect Covid pneumonia.  He will be a candidate for monoclonal antibody infusion.  Patient needs labs, comprehensive imaging including a possible head CT, feel that he is sick enough that he will most likely require admission to the hospital for ongoing management and observation.  Discussed this with patient and wife.  Discussed with wife that she is to not going to the emergency department given that she is infectious.  Advised him to go immediately to the [facility] or [facility] emergency department.  However feel that he is stable to go by private vehicle. They agree to go.   No orders of the defined types were placed in this encounter.   *This clinic note was created using Dragon dictation software. Therefore, there may be occasional mistakes despite careful proofreading.  ?    Melynda Ripple, MD 04/26/20 [phone]

## 2020-05-26 DIAGNOSIS — E785 Hyperlipidemia, unspecified: Secondary | ICD-10-CM | POA: Diagnosis not present

## 2020-05-26 DIAGNOSIS — Z23 Encounter for immunization: Secondary | ICD-10-CM | POA: Diagnosis not present

## 2020-05-26 DIAGNOSIS — I4891 Unspecified atrial fibrillation: Secondary | ICD-10-CM | POA: Diagnosis not present

## 2020-08-01 ENCOUNTER — Telehealth: Payer: Self-pay | Admitting: Nurse Practitioner

## 2020-08-01 NOTE — Telephone Encounter (Signed)
Copied from [place] [phone]. Topic: Medicare AWV >> Aug 01, 2020  1:12 PM Cher Nakai R wrote: Reason for CRM:  Left message for patient to call back and schedule the Medicare Annual Wellness Visit (AWV) virtually.  Last AWV 03/17/2019  Please schedule at anytime with CFP-Nurse Health Advisor.  45 minute appointment  Any questions, please call me at [phone]

## 2020-08-04 DIAGNOSIS — M79644 Pain in right finger(s): Secondary | ICD-10-CM | POA: Diagnosis not present

## 2020-08-14 DIAGNOSIS — H40003 Preglaucoma, unspecified, bilateral: Secondary | ICD-10-CM | POA: Diagnosis not present

## 2020-08-21 DIAGNOSIS — I825Y1 Chronic embolism and thrombosis of unspecified deep veins of right proximal lower extremity: Secondary | ICD-10-CM | POA: Diagnosis not present

## 2020-08-21 DIAGNOSIS — I872 Venous insufficiency (chronic) (peripheral): Secondary | ICD-10-CM | POA: Diagnosis not present

## 2020-08-21 DIAGNOSIS — I4811 Longstanding persistent atrial fibrillation: Secondary | ICD-10-CM | POA: Diagnosis not present

## 2020-08-21 DIAGNOSIS — E785 Hyperlipidemia, unspecified: Secondary | ICD-10-CM | POA: Diagnosis not present

## 2020-08-28 DIAGNOSIS — Z86718 Personal history of other venous thrombosis and embolism: Secondary | ICD-10-CM | POA: Diagnosis not present

## 2020-08-28 DIAGNOSIS — Z0000 Encounter for general adult medical examination without abnormal findings: Secondary | ICD-10-CM | POA: Diagnosis not present

## 2020-08-28 DIAGNOSIS — I872 Venous insufficiency (chronic) (peripheral): Secondary | ICD-10-CM | POA: Diagnosis not present

## 2020-08-28 DIAGNOSIS — I4891 Unspecified atrial fibrillation: Secondary | ICD-10-CM | POA: Diagnosis not present

## 2020-09-08 ENCOUNTER — Telehealth: Payer: Self-pay | Admitting: Nurse Practitioner

## 2020-09-08 NOTE — Telephone Encounter (Signed)
Copied from [place] [id number]. Topic: Medicare AWV >> Sep 08, 2020  4:12 PM Cher Nakai R wrote: Reason for CRM:  Left message for patient to call back and schedule the Medicare Annual Wellness Visit (AWV) virtually.  Last AWV 03/17/2019  Please schedule at anytime with CFP-Nurse Health Advisor.  45 minute appointment  Any questions, please call me at [phone]

## 2020-09-18 ENCOUNTER — Ambulatory Visit: Payer: Medicare Other | Admitting: Nurse Practitioner

## 2020-11-07 DIAGNOSIS — Z96651 Presence of right artificial knee joint: Secondary | ICD-10-CM | POA: Diagnosis not present

## 2020-11-07 DIAGNOSIS — Z96652 Presence of left artificial knee joint: Secondary | ICD-10-CM | POA: Diagnosis not present

## 2021-02-12 ENCOUNTER — Other Ambulatory Visit: Payer: Self-pay | Admitting: Ophthalmology

## 2021-02-12 ENCOUNTER — Other Ambulatory Visit (HOSPITAL_COMMUNITY): Payer: Self-pay | Admitting: Ophthalmology

## 2021-02-12 DIAGNOSIS — H4720 Unspecified optic atrophy: Secondary | ICD-10-CM

## 2021-02-12 DIAGNOSIS — H35372 Puckering of macula, left eye: Secondary | ICD-10-CM | POA: Diagnosis not present

## 2021-02-23 ENCOUNTER — Ambulatory Visit
Admission: RE | Admit: 2021-02-23 | Discharge: 2021-02-23 | Disposition: A | Discharge status: Home / Self Care | Payer: Medicare Other | Source: Ambulatory Visit | Attending: Ophthalmology | Admitting: Ophthalmology

## 2021-02-23 ENCOUNTER — Other Ambulatory Visit: Payer: Self-pay

## 2021-02-23 DIAGNOSIS — H4720 Unspecified optic atrophy: Secondary | ICD-10-CM | POA: Diagnosis not present

## 2021-02-23 DIAGNOSIS — H538 Other visual disturbances: Secondary | ICD-10-CM | POA: Diagnosis not present

## 2021-02-23 DIAGNOSIS — J3489 Other specified disorders of nose and nasal sinuses: Secondary | ICD-10-CM | POA: Diagnosis not present

## 2021-02-23 LAB — POCT I-STAT CREATININE: Creatinine, Ser: 0.9 mg/dL (ref 0.61–1.24)

## 2021-02-23 MED ORDER — IOHEXOL 300 MG/ML  SOLN
75.0000 mL | Freq: Once | INTRAMUSCULAR | Status: AC | PRN
  Administered 2021-02-23: 12:00:00 75 mL via INTRAVENOUS

## 2021-03-01 ENCOUNTER — Encounter: Payer: Self-pay | Admitting: Ophthalmology

## 2021-03-05 DIAGNOSIS — R739 Hyperglycemia, unspecified: Secondary | ICD-10-CM | POA: Diagnosis not present

## 2021-03-05 DIAGNOSIS — H2512 Age-related nuclear cataract, left eye: Secondary | ICD-10-CM | POA: Diagnosis not present

## 2021-03-05 DIAGNOSIS — I872 Venous insufficiency (chronic) (peripheral): Secondary | ICD-10-CM | POA: Diagnosis not present

## 2021-03-05 DIAGNOSIS — E785 Hyperlipidemia, unspecified: Secondary | ICD-10-CM | POA: Diagnosis not present

## 2021-03-05 DIAGNOSIS — I4811 Longstanding persistent atrial fibrillation: Secondary | ICD-10-CM | POA: Diagnosis not present

## 2021-03-05 DIAGNOSIS — I825Y1 Chronic embolism and thrombosis of unspecified deep veins of right proximal lower extremity: Secondary | ICD-10-CM | POA: Diagnosis not present

## 2021-03-12 DIAGNOSIS — E785 Hyperlipidemia, unspecified: Secondary | ICD-10-CM | POA: Diagnosis not present

## 2021-03-12 DIAGNOSIS — R7303 Prediabetes: Secondary | ICD-10-CM | POA: Diagnosis not present

## 2021-03-12 DIAGNOSIS — I10 Essential (primary) hypertension: Secondary | ICD-10-CM | POA: Diagnosis not present

## 2021-03-12 DIAGNOSIS — I4811 Longstanding persistent atrial fibrillation: Secondary | ICD-10-CM | POA: Diagnosis not present

## 2021-03-12 DIAGNOSIS — I878 Other specified disorders of veins: Secondary | ICD-10-CM | POA: Diagnosis not present

## 2021-03-19 ENCOUNTER — Ambulatory Visit: Payer: Medicare Other | Admitting: Anesthesiology

## 2021-03-19 ENCOUNTER — Other Ambulatory Visit: Payer: Self-pay

## 2021-03-19 ENCOUNTER — Encounter
Admission: RE | Disposition: A | Discharge status: Home / Self Care | Payer: Self-pay | Source: Home / Self Care | Attending: Ophthalmology

## 2021-03-19 ENCOUNTER — Encounter: Payer: Self-pay | Admitting: Ophthalmology

## 2021-03-19 ENCOUNTER — Ambulatory Visit
Admission: RE | Admit: 2021-03-19 | Discharge: 2021-03-19 | Disposition: A | Discharge status: Home / Self Care | Payer: Medicare Other | Attending: Ophthalmology | Admitting: Ophthalmology

## 2021-03-19 DIAGNOSIS — H2512 Age-related nuclear cataract, left eye: Secondary | ICD-10-CM | POA: Diagnosis not present

## 2021-03-19 DIAGNOSIS — Z7901 Long term (current) use of anticoagulants: Secondary | ICD-10-CM | POA: Diagnosis not present

## 2021-03-19 DIAGNOSIS — Z9841 Cataract extraction status, right eye: Secondary | ICD-10-CM | POA: Insufficient documentation

## 2021-03-19 DIAGNOSIS — H25812 Combined forms of age-related cataract, left eye: Secondary | ICD-10-CM | POA: Diagnosis not present

## 2021-03-19 DIAGNOSIS — Z79899 Other long term (current) drug therapy: Secondary | ICD-10-CM | POA: Diagnosis not present

## 2021-03-19 DIAGNOSIS — Z961 Presence of intraocular lens: Secondary | ICD-10-CM | POA: Diagnosis not present

## 2021-03-19 DIAGNOSIS — Z87891 Personal history of nicotine dependence: Secondary | ICD-10-CM | POA: Diagnosis not present

## 2021-03-19 HISTORY — PX: CATARACT EXTRACTION W/PHACO: SHX586

## 2021-03-19 SURGERY — PHACOEMULSIFICATION, CATARACT, WITH IOL INSERTION
Anesthesia: Monitor Anesthesia Care | Site: Eye | Laterality: Left

## 2021-03-19 MED ORDER — ACETAMINOPHEN 325 MG PO TABS
650.0000 mg | ORAL_TABLET | Freq: Four times a day (QID) | ORAL | Status: DC | PRN
Start: 2021-03-19 — End: 2021-03-19
  Administered 2021-03-19: 650 mg via ORAL

## 2021-03-19 MED ORDER — SIGHTPATH DOSE#1 BSS IO SOLN
INTRAOCULAR | Status: DC | PRN
  Administered 2021-03-19: 73 mL via OPHTHALMIC

## 2021-03-19 MED ORDER — TETRACAINE HCL 0.5 % OP SOLN: 1.0000 [drp] | OPHTHALMIC | Status: DC | PRN

## 2021-03-19 MED ORDER — FENTANYL CITRATE (PF) 100 MCG/2ML IJ SOLN
INTRAMUSCULAR | Status: DC | PRN
  Administered 2021-03-19: 50 ug via INTRAVENOUS

## 2021-03-19 MED ORDER — MOXIFLOXACIN HCL 0.5 % OP SOLN
OPHTHALMIC | Status: DC | PRN
  Administered 2021-03-19: 0.2 mL via OPHTHALMIC

## 2021-03-19 MED ORDER — SIGHTPATH DOSE#1 NA CHONDROIT SULF-NA HYALURON 40-17 MG/ML IO SOLN
INTRAOCULAR | Status: DC | PRN
  Administered 2021-03-19: 1 mL via INTRAOCULAR

## 2021-03-19 MED ORDER — MIDAZOLAM HCL 2 MG/2ML IJ SOLN
INTRAMUSCULAR | Status: DC | PRN
  Administered 2021-03-19: 1 mg via INTRAVENOUS

## 2021-03-19 MED ORDER — ARMC OPHTHALMIC DILATING DROPS: 1.0000 "application " | OPHTHALMIC | Status: DC | PRN

## 2021-03-19 MED ORDER — PHENYLEPHRINE HCL 10 % OP SOLN
1.0000 [drp] | OPHTHALMIC | Status: DC | PRN
  Administered 2021-03-19 (×3): 1 [drp] via OPHTHALMIC

## 2021-03-19 MED ORDER — SIGHTPATH DOSE#1 BSS IO SOLN
INTRAOCULAR | Status: DC | PRN
  Administered 2021-03-19: 1 mL

## 2021-03-19 MED ORDER — SIGHTPATH DOSE#1 BSS IO SOLN
INTRAOCULAR | Status: DC | PRN
  Administered 2021-03-19: 15 mL via INTRAOCULAR

## 2021-03-19 MED ORDER — CYCLOPENTOLATE HCL 2 % OP SOLN
1.0000 [drp] | OPHTHALMIC | Status: DC | PRN
  Administered 2021-03-19 (×3): 1 [drp] via OPHTHALMIC

## 2021-03-19 MED ORDER — TETRACAINE HCL 0.5 % OP SOLN
1.0000 [drp] | OPHTHALMIC | Status: AC | PRN
  Administered 2021-03-19 (×3): 1 [drp] via OPHTHALMIC

## 2021-03-19 MED ORDER — LACTATED RINGERS IV SOLN: INTRAVENOUS | Status: DC

## 2021-03-19 MED ORDER — BRIMONIDINE TARTRATE-TIMOLOL 0.2-0.5 % OP SOLN
OPHTHALMIC | Status: DC | PRN
  Administered 2021-03-19: 1 [drp] via OPHTHALMIC

## 2021-03-19 SURGICAL SUPPLY — 16 items
CANNULA ANT/CHMB 27GA (MISCELLANEOUS) ×4 IMPLANT
GLOVE SURG ENC TEXT LTX SZ8 (GLOVE) ×4 IMPLANT
GLOVE SURG TRIUMPH 8.0 PF LTX (GLOVE) ×2 IMPLANT
GOWN STRL REUS W/ TWL LRG LVL3 (GOWN DISPOSABLE) ×2 IMPLANT
GOWN STRL REUS W/TWL LRG LVL3 (GOWN DISPOSABLE) ×4
LENS IOL TECNIS EYHANCE 20.0 (Intraocular Lens) ×2 IMPLANT
MARKER SKIN DUAL TIP RULER LAB (MISCELLANEOUS) ×2 IMPLANT
NEEDLE FILTER BLUNT 18X 1/2SAF (NEEDLE) ×1
NEEDLE FILTER BLUNT 18X1 1/2 (NEEDLE) ×1 IMPLANT
PACK EYE AFTER SURG (MISCELLANEOUS) ×2 IMPLANT
SUT ETHILON 10-0 CS-B-6CS-B-6 (SUTURE)
SUTURE EHLN 10-0 CS-B-6CS-B-6 (SUTURE) IMPLANT
SYR 3ML LL SCALE MARK (SYRINGE) ×2 IMPLANT
SYR TB 1ML LUER SLIP (SYRINGE) ×2 IMPLANT
WATER STERILE IRR 250ML POUR (IV SOLUTION) ×2 IMPLANT
WIPE NON LINTING 3.25X3.25 (MISCELLANEOUS) ×2 IMPLANT

## 2021-03-19 NOTE — Anesthesia Postprocedure Evaluation (Signed)
Anesthesia Post Note  Patient: Hector Bennett  Procedure(s) Performed: CATARACT EXTRACTION PHACO AND INTRAOCULAR LENS PLACEMENT (IOC) LEFT (Left: Eye)     Patient location during evaluation: PACU Anesthesia Type: MAC Level of consciousness: awake and alert Pain management: pain level controlled Vital Signs Assessment: post-procedure vital signs reviewed and stable Respiratory status: spontaneous breathing, nonlabored ventilation, respiratory function stable and patient connected to nasal cannula oxygen Cardiovascular status: stable and blood pressure returned to baseline Postop Assessment: no apparent nausea or vomiting Anesthetic complications: no   No notable events documented.  Alisa Graff

## 2021-03-19 NOTE — Op Note (Signed)
PREOPERATIVE DIAGNOSIS:  Nuclear sclerotic cataract of the left eye.   POSTOPERATIVE DIAGNOSIS:  Nuclear sclerotic cataract of the left eye.   OPERATIVE PROCEDURE:ORPROCALL@   SURGEON:  [name], MD.   ANESTHESIA:  Anesthesiologist: Alisa Graff, MD CRNA: Cameron Ali, CRNA  1.      Managed anesthesia care. 2.     0.66m of Shugarcaine was instilled following the paracentesis   COMPLICATIONS:  None.   TECHNIQUE:   Stop and chop   DESCRIPTION OF PROCEDURE:  The patient was examined and consented in the preoperative holding area where the aforementioned topical anesthesia was applied to the left eye and then brought back to the Operating Room where the left eye was prepped and draped in the usual sterile ophthalmic fashion and a lid speculum was placed. A paracentesis was created with the side port blade and the anterior chamber was filled with viscoelastic. A near clear corneal incision was performed with the steel keratome. A continuous curvilinear capsulorrhexis was performed with a cystotome followed by the capsulorrhexis forceps. Hydrodissection and hydrodelineation were carried out with BSS on a blunt cannula. The lens was removed in a stop and chop  technique and the remaining cortical material was removed with the irrigation-aspiration handpiece. The capsular bag was inflated with viscoelastic and the Technis [id number] lens was placed in the capsular bag without complication. The remaining viscoelastic was removed from the eye with the irrigation-aspiration handpiece. The wounds were hydrated. The anterior chamber was flushed with BSS and the eye was inflated to physiologic pressure. 0.162mVigamox was placed in the anterior chamber. The wounds were found to be water tight. The eye was dressed with Combigan. The patient was given protective glasses to wear throughout the day and a shield with which to sleep tonight. The patient was also given drops with which to begin a drop regimen  today and will follow-up with me in one day. Implant Name Type Inv. Item Serial No. Manufacturer Lot No. LRB No. Used Action  LENS IOL TECNIS EYHANCE 20.0 - [id number] Lens LENS IOL TECNIS EYHANCE 20.0 [id number]   Left 1 Implanted    Procedure(s) with comments: CATARACT EXTRACTION PHACO AND INTRAOCULAR LENS PLACEMENT (IOC) LEFT (Left) - 12.21 1:05.4  Electronically signed: WiBirder Robson/25/2022 10:00 AM

## 2021-03-19 NOTE — Anesthesia Preprocedure Evaluation (Signed)
Anesthesia Evaluation  Patient identified by MRN, date of birth, ID band Patient awake    Reviewed: Allergy & Precautions, H&P , NPO status , Patient's Chart, lab work & pertinent test results, reviewed documented beta blocker date and time   Airway Mallampati: II  TM Distance: >3 FB Neck ROM: full    Dental no notable dental hx.    Pulmonary neg pulmonary ROS, former smoker,    Pulmonary exam normal breath sounds clear to auscultation       Cardiovascular Exercise Tolerance: Good hypertension, + Peripheral Vascular Disease (h/o thrombophlebitis) and + DVT (PE)  + dysrhythmias Atrial Fibrillation  Rhythm:regular Rate:Normal     Neuro/Psych negative neurological ROS  negative psych ROS   GI/Hepatic negative GI ROS, Neg liver ROS,   Endo/Other  negative endocrine ROS  Renal/GU negative Renal ROS  negative genitourinary   Musculoskeletal   Abdominal   Peds  Hematology negative hematology ROS (+)   Anesthesia Other Findings   Reproductive/Obstetrics negative OB ROS                             Anesthesia Physical Anesthesia Plan  ASA: 3  Anesthesia Plan: MAC   Post-op Pain Management:    Induction:   PONV Risk Score and Plan: 1 and Treatment may vary due to age or medical condition  Airway Management Planned:   Additional Equipment:   Intra-op Plan:   Post-operative Plan:   Informed Consent: I have reviewed the patients History and Physical, chart, labs and discussed the procedure including the risks, benefits and alternatives for the proposed anesthesia with the patient or authorized representative who has indicated his/her understanding and acceptance.     Dental Advisory Given  Plan Discussed with: CRNA  Anesthesia Plan Comments:         Anesthesia Quick Evaluation

## 2021-03-19 NOTE — H&P (Signed)
[facility]   Primary Care Physician:  Leonel Ramsay, MD Ophthalmologist: Dr.Lucillie Kiesel  Pre-Procedure History & Physical: HPI:  Hector Bennett is a 75 y.o. male here for cataract surgery.   Past Medical History:  Diagnosis Date   A-fib [facility])    Arthritis    DVT (deep venous thrombosis) ([place]) 2007, 2009   Dysrhythmia    af   Hearing loss    Hyperlipidemia    Hypertension    Personal history of thrombophlebitis    Pulmonary embolism ([place]) 11/26/2007   Bilateral    Past Surgical History:  Procedure Laterality Date   CATARACT EXTRACTION W/PHACO Right 09/09/2019   Procedure: CATARACT EXTRACTION PHACO AND INTRAOCULAR LENS PLACEMENT ([place]) RIGHT;  Surgeon: Marchia Meiers, MD;  Location: [facility] ORS;  Service: Ophthalmology;  Laterality: Right;  [place] 01:07.3 CDE 8.71 Fluid Pack Lot # [id number]   COLONOSCOPY WITH PROPOFOL N/A 10/20/2017   Procedure: COLONOSCOPY WITH PROPOFOL;  Surgeon: Jonathon Bellows, MD;  Location: [facility] ENDOSCOPY;  Service: Gastroenterology;  Laterality: N/A;   COLONOSCOPY WITH PROPOFOL N/A 03/02/2018   Procedure: COLONOSCOPY WITH PROPOFOL;  Surgeon: Jonathon Bellows, MD;  Location: [facility] ENDOSCOPY;  Service: Gastroenterology;  Laterality: N/A;   HAND SURGERY Left 11/09/2014   IVC FILTER INSERTION Right 10/05/2018   Procedure: IVC FILTER INSERTION;  Surgeon: Algernon Huxley, MD;  Location: [place] CV LAB;  Service: Cardiovascular;  Laterality: Right;   IVC FILTER REMOVAL N/A 04/26/2019   Procedure: IVC FILTER REMOVAL;  Surgeon: Algernon Huxley, MD;  Location: [place] CV LAB;  Service: Cardiovascular;  Laterality: N/A;   JOINT REPLACEMENT Bilateral    knee replacements   KNEE ARTHROPLASTY Left 07/31/2015   Procedure: COMPUTER ASSISTED TOTAL KNEE ARTHROPLASTY;  Surgeon: Dereck Leep, MD;  Location: [facility] ORS;  Service: Orthopedics;  Laterality: Left;   KNEE ARTHROPLASTY Right 10/12/2018   Procedure: COMPUTER ASSISTED TOTAL KNEE ARTHROPLASTY-RIGHT;  Surgeon: Dereck Leep, MD;  Location: [facility] ORS;  Service: Orthopedics;  Laterality: Right;   KNEE SURGERY  1975   PERIPHERAL VASCULAR CATHETERIZATION N/A 07/25/2015   Procedure: IVC Filter Insertion;  Surgeon: Katha Cabal, MD;  Location: [place] CV LAB;  Service: Cardiovascular;  Laterality: N/A;   PERIPHERAL VASCULAR CATHETERIZATION N/A 08/29/2015   Procedure: IVC Filter Removal;  Surgeon: Katha Cabal, MD;  Location: [place] CV LAB;  Service: Cardiovascular;  Laterality: N/A;    Prior to Admission medications   Medication Sig Start Date End Date Taking? Authorizing Provider  acetaminophen (TYLENOL) 650 MG CR tablet Take 650 mg by mouth every 8 (eight) hours as needed.   Yes [provider]  atenolol (TENORMIN) 50 MG tablet Take 1 tablet (50 mg total) by mouth every morning. 08/30/19  Yes Cannady, Jolene T, NP  benazepril (LOTENSIN) 20 MG tablet Take 1 tablet (20 mg total) by mouth daily. 08/30/19  Yes Cannady, Jolene T, NP  hydrochlorothiazide (HYDRODIURIL) 25 MG tablet Take 1 tablet (25 mg total) by mouth daily. 08/30/19  Yes Cannady, Jolene T, NP  latanoprost (XALATAN) 0.005 % ophthalmic solution Place 1 drop into both eyes at bedtime.   Yes [provider]  lovastatin (MEVACOR) 20 MG tablet Take 1 tablet (20 mg total) by mouth at bedtime. 08/30/19  Yes Cannady, Jolene T, NP  Multiple Vitamins-Minerals (CENTRUM SILVER PO) Take by mouth daily.   Yes [provider]  rivaroxaban (XARELTO) 20 MG TABS tablet Take 1 tablet (20 mg total) by mouth daily with supper.  03/06/20  Yes Marnee Guarneri T, NP    Allergies as of 02/12/2021   (No Known Allergies)    Family History  Problem Relation Age of Onset   Hypertension Mother    Hypertension Brother    Diabetes Brother    Dementia Brother     Social History   Socioeconomic History   Marital status: Married    Spouse name: Not on file   Number of children: Not on file   Years of education: Not on file   Highest  education level: High school graduate  Occupational History   Occupation: retired  Tobacco Use   Smoking status: Former    Types: Cigarettes   Smokeless tobacco: Never   Tobacco comments:    quit 40 years ago   Scientific laboratory technician Use: Never used  Substance and Sexual Activity   Alcohol use: Yes    Comment: occasional   Drug use: No   Sexual activity: Not on file  Other Topics Concern   Not on file  Social History Narrative   Not on file   Social Determinants of Health   Financial Resource Strain: Not on file  Food Insecurity: Not on file  Transportation Needs: Not on file  Physical Activity: Not on file  Stress: Not on file  Social Connections: Not on file  Intimate Partner Violence: Not on file    Review of Systems: See HPI, otherwise negative ROS  Physical Exam: BP (!) 152/94   Pulse 76   Temp 98.2 F (36.8 C) (Temporal)   Resp 18   Ht 6' (1.829 m)   Wt (!) 142.4 kg   SpO2 96%   BMI 42.59 kg/m  General:   Alert, cooperative in NAD Head:  Normocephalic and atraumatic. Respiratory:  Normal work of breathing. Cardiovascular:  RRR  Impression/Plan: Hector Bennett is here for cataract surgery.  Risks, benefits, limitations, and alternatives regarding cataract surgery have been reviewed with the patient.  Questions have been answered.  All parties agreeable.   [name], MD  03/19/2021, 9:33 AM

## 2021-03-19 NOTE — Transfer of Care (Signed)
Immediate Anesthesia Transfer of Care Note  Patient: Hector Bennett  Procedure(s) Performed: CATARACT EXTRACTION PHACO AND INTRAOCULAR LENS PLACEMENT (IOC) LEFT (Left: Eye)  Patient Location: PACU  Anesthesia Type: MAC  Level of Consciousness: awake, alert  and patient cooperative  Airway and Oxygen Therapy: Patient Spontanous Breathing and Patient connected to supplemental oxygen  Post-op Assessment: Post-op Vital signs reviewed, Patient's Cardiovascular Status Stable, Respiratory Function Stable, Patent Airway and No signs of Nausea or vomiting  Post-op Vital Signs: Reviewed and stable  Complications: No notable events documented.

## 2021-03-19 NOTE — Anesthesia Procedure Notes (Signed)
Procedure Name: MAC Date/Time: 03/19/2021 9:40 AM Performed by: Cameron Ali, CRNA Pre-anesthesia Checklist: Patient identified, Emergency Drugs available, Suction available, Timeout performed and Patient being monitored Patient Re-evaluated:Patient Re-evaluated prior to induction Oxygen Delivery Method: Nasal cannula Placement Confirmation: positive ETCO2

## 2021-03-20 ENCOUNTER — Encounter: Payer: Self-pay | Admitting: Ophthalmology

## 2021-05-14 ENCOUNTER — Ambulatory Visit: Payer: Medicare Other | Admitting: Dermatology

## 2021-09-17 IMAGING — CR DG CHEST 2V
3 series · 3 of 3 positions shown · non-contrast
Comparison: Chest CT 11/27/2007

CLINICAL DATA: COVID exposure with cough and fatigue

EXAM:
CHEST - 2 VIEW

[chest pa]
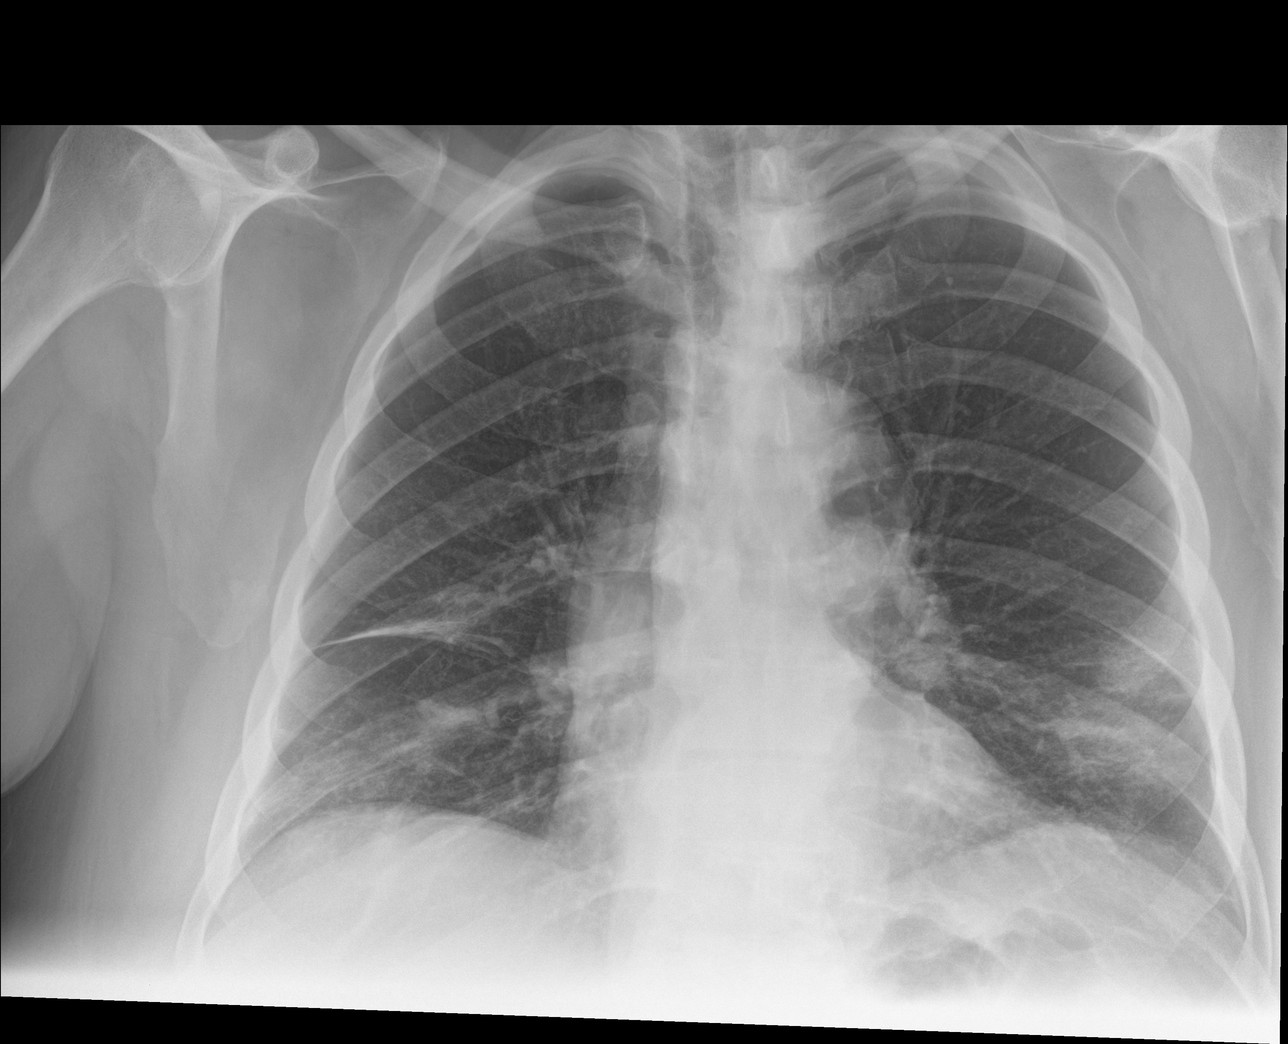

[chest lat (1 of 2)]
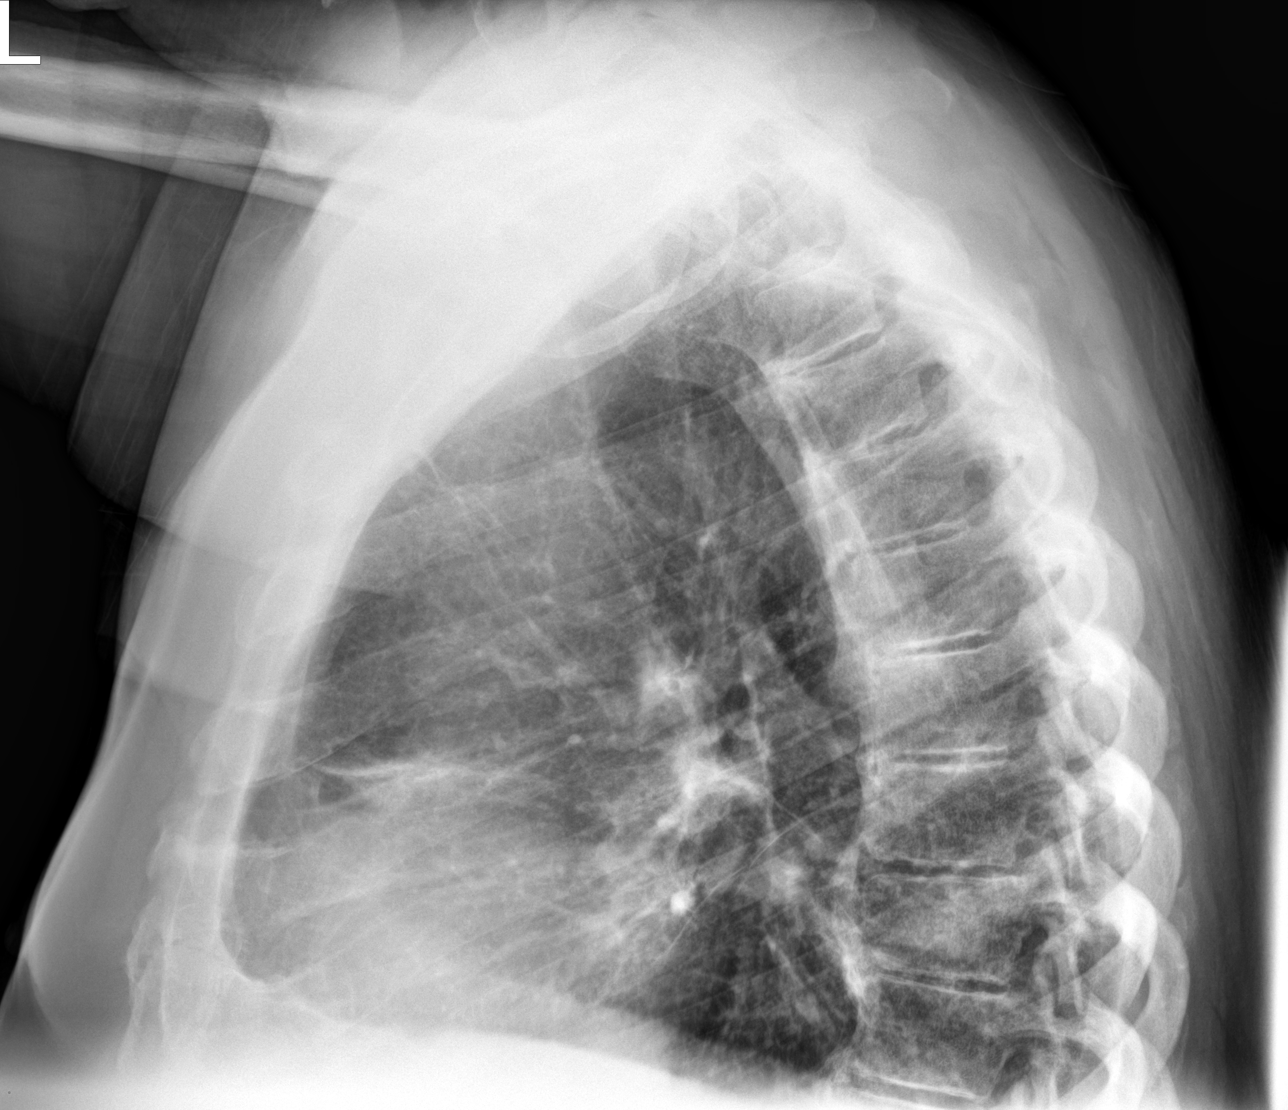

[chest lat (2 of 2)]
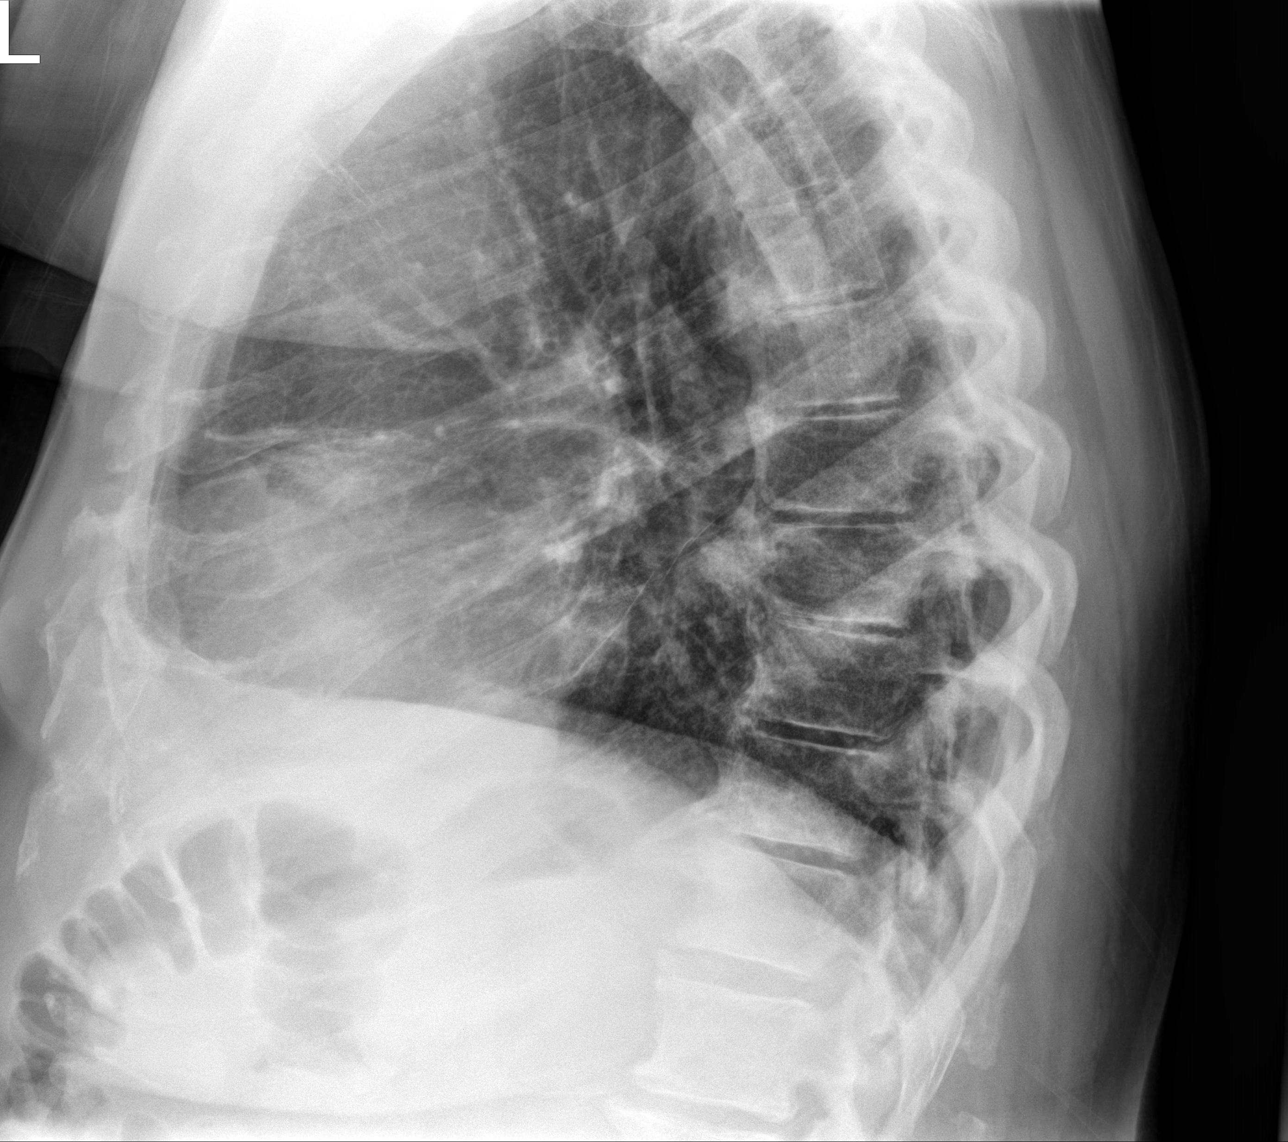

[3 of 3 positions shown; findings below may reference images not displayed]

FINDINGS: Patchy bilateral airspace disease. Mild atelectasis or scarring in
the right lung along the minor fissure. No edema, effusion, or
pneumothorax. Normal heart size. Aortic tortuosity.
IMPRESSION: Bilateral pneumonia.

## 2023-03-17 NOTE — Progress Notes (Addendum)
[facility] - Clinic Note  03/18/2023   CHIEF COMPLAINT Patient presents for Retina Evaluation  HISTORY OF PRESENT ILLNESS: Hector Bennett is a 77 y.o. male who presents to the clinic today for:  HPI     Retina Evaluation   In right eye.  This started 1 week ago.  Duration of 1 week.  I, the attending physician,  performed the HPI with the patient and updated documentation appropriately.        Comments   Retina eval per Dr Bascom Levels for old BRVO OD pt is reporting no vision changes noticed he denies any flashes has some floaters pt has COAG and is using brim  OD bid and simb OD bid       Last edited by Rennis Chris, MD on 03/18/2023  9:06 PM.     Referring physician: Fraz[place], [place], OD [address] [place],  [place] [address]  HISTORICAL INFORMATION:  Selected notes from the MEDICAL RECORD NUMBER Referred by Dr. Bascom Levels for retina eval -- concern for low vision / retinal ischemia OD LEE:  Ocular Hx- PMH-   CURRENT MEDICATIONS: Current Outpatient Medications (Ophthalmic Drugs)  Medication Sig   latanoprost (XALATAN) 0.005 % ophthalmic solution Place 1 drop into both eyes at bedtime. (Patient not taking: Reported on 03/18/2023)   No current facility-administered medications for this visit. (Ophthalmic Drugs)   Current Outpatient Medications (Other)  Medication Sig   acetaminophen (TYLENOL) 650 MG CR tablet Take 650 mg by mouth every 8 (eight) hours as needed.   atenolol (TENORMIN) 50 MG tablet Take 1 tablet (50 mg total) by mouth every morning.   benazepril (LOTENSIN) 20 MG tablet Take 1 tablet (20 mg total) by mouth daily.   hydrochlorothiazide (HYDRODIURIL) 25 MG tablet Take 1 tablet (25 mg total) by mouth daily.   lovastatin (MEVACOR) 20 MG tablet Take 1 tablet (20 mg total) by mouth at bedtime.   Multiple Vitamins-Minerals (CENTRUM SILVER PO) Take by mouth daily.   rivaroxaban (XARELTO) 20 MG TABS tablet Take 1 tablet (20 mg total) by  mouth daily with supper.   No current facility-administered medications for this visit. (Other)   REVIEW OF SYSTEMS: ROS   Positive for: Musculoskeletal, Cardiovascular, Eyes Last edited by Rennis Chris, MD on 03/18/2023  9:07 PM.     ALLERGIES No Known Allergies PAST MEDICAL HISTORY Past Medical History:  Diagnosis Date   A-fib [facility])    Arthritis    DVT (deep venous thrombosis) (HCC) 2007, 2009   Dysrhythmia    af   Hearing loss    Hyperlipidemia    Hypertension    Personal history of thrombophlebitis    Pulmonary embolism (HCC) 11/26/2007   Bilateral   Past Surgical History:  Procedure Laterality Date   CATARACT EXTRACTION W/PHACO Right 09/09/2019   Procedure: CATARACT EXTRACTION PHACO AND INTRAOCULAR LENS PLACEMENT (IOC) RIGHT;  Surgeon: Elliot Cousin, MD;  Location: [facility] ORS;  Service: Ophthalmology;  Laterality: Right;  [place] 01:07.3 CDE 8.71 Fluid Pack Lot # [id number]   CATARACT EXTRACTION W/PHACO Left 03/19/2021   Procedure: CATARACT EXTRACTION PHACO AND INTRAOCULAR LENS PLACEMENT (IOC) LEFT;  Surgeon: Galen Manila, MD;  [facility];  Service: Ophthalmology;  Laterality: Left;  12.21 1:05.4   COLONOSCOPY WITH PROPOFOL N/A 10/20/2017   Procedure: COLONOSCOPY WITH PROPOFOL;  Surgeon: Wyline Mood, MD;  Location: [facility] ENDOSCOPY;  Service: Gastroenterology;  Laterality: N/A;   COLONOSCOPY WITH PROPOFOL N/A 03/02/2018   Procedure: COLONOSCOPY WITH  PROPOFOL;  Surgeon: Wyline Mood, MD;  Location: [facility] ENDOSCOPY;  Service: Gastroenterology;  Laterality: N/A;   HAND SURGERY Left 11/09/2014   IVC FILTER INSERTION Right 10/05/2018   Procedure: IVC FILTER INSERTION;  Surgeon: Annice Needy, MD;  Location: [facility] INVASIVE CV LAB;  Service: Cardiovascular;  Laterality: Right;   IVC FILTER REMOVAL N/A 04/26/2019   Procedure: IVC FILTER REMOVAL;  Surgeon: Annice Needy, MD;  Location: [facility];  Service: Cardiovascular;  Laterality: N/A;   JOINT REPLACEMENT  Bilateral    knee replacements   KNEE ARTHROPLASTY Left 07/31/2015   Procedure: COMPUTER ASSISTED TOTAL KNEE ARTHROPLASTY;  Surgeon: Donato Heinz, MD;  Location: [facility]S;  Service: Orthopedics;  Laterality: Left;   KNEE ARTHROPLASTY Right 10/12/2018   Procedure: COMPUTER ASSISTED TOTAL KNEE ARTHROPLASTY-RIGHT;  Surgeon: Donato Heinz, MD;  Location: [facility];  Service: Orthopedics;  Laterality: Right;   KNEE SURGERY  1975   PERIPHERAL VASCULAR CATHETERIZATION N/A 07/25/2015   Procedure: IVC Filter Insertion;  Surgeon: Renford Dills, MD;  Location: [facility];  Service: Cardiovascular;  Laterality: N/A;   PERIPHERAL VASCULAR CATHETERIZATION N/A 08/29/2015   Procedure: IVC Filter Removal;  Surgeon: Renford Dills, MD;  Location: [facility];  Service: Cardiovascular;  Laterality: N/A;   FAMILY HISTORY Family History  Problem Relation Age of Onset   Hypertension Mother    Hypertension Brother    Diabetes Brother    Dementia Brother    SOCIAL HISTORY Social History   Tobacco Use   Smoking status: Former    Types: Cigarettes   Smokeless tobacco: Never   Tobacco comments:    quit 40 years ago   Vaping Use   Vaping status: Never Used  Substance Use Topics   Alcohol use: Yes    Comment: occasional   Drug use: No       OPHTHALMIC EXAM:  Base Eye Exam     Visual Acuity (Snellen - Linear)       Right Left   Dist cc HM 20/25 -3   Dist ph cc NI NI    Correction: Glasses         Tonometry (Tonopen, 1:39 PM)       Right Left   Pressure 18 17         Pupils       Pupils Dark Light Shape React APD   Right PERRL 4 4 Round NR +1   Left PERRL 4 3 Round Minimal None         Visual Fields       Left Right   Restrictions Total superior temporal, inferior temporal, superior nasal, inferior nasal deficiencies Partial outer superior temporal, inferior temporal, superior nasal, inferior nasal deficiencies         Extraocular Movement        Right Left    Full, Ortho Full, Ortho         Neuro/Psych     Oriented x3: Yes   Mood/Affect: Normal         Dilation     Both eyes: 2.5% Phenylephrine @ 1:39 PM           Slit Lamp and Fundus Exam     External Exam       Right Left   External Normal Normal         Slit Lamp Exam       Right Left   Lids/Lashes Dermatochalasis - upper lid,  Meibomian gland dysfunction Dermatochalasis - upper lid, Meibomian gland dysfunction   Conjunctiva/Sclera White and quiet White and quiet   Cornea Arcus, Well healed temporal cataract wound, Debris in tear film, Punctate epithelial erosions Arcus, Well healed temporal cataract wound, Debris in tear film, Punctate epithelial erosions   Anterior Chamber Deep and clear Deep and clear   Iris scattered TID 's at 2, 5, 8 oclock Round and dilated   Lens PC IOL in good postition, 1+ Posterior capsular opacification PC IOL in good postition, Posterior capsular opacification   Anterior Vitreous Vitreous syneresis, + fine cell/pigment, Vitreous condensations, Posterior vitreous detachment Vitreous syneresis, Vitreous condensations, Posterior vitreous detachment         Fundus Exam       Right Left   Disc 3-4+ pallor, sharp rim, attenuated vessels 1-2+ pallor, sharp rim   C/D Ratio 0.75 0.5   Macula Flat, Blunted foveal reflex, diffuse atrphy, flat nevus inferior to fovea 1DD, no heme/SRF/or orange pigment, RPE mottling, ERM Flat, Blunted foveal reflex, mild ERM, diffuse atrophy, no heme/edmea   Vessels Severe Vascular attenuation Severe Vascular attenuation, Tortuous   Periphery Attached, diffused atrophy, no heme Attached, No heme, diffused atrophy           Refraction     Wearing Rx       Sphere Cylinder Axis Add   Right +0.25 +1.00 174 +2.50   Left -0.50 +1.50 003 +2.50         Manifest Refraction   Unable to correct any better           IMAGING AND PROCEDURES  Imaging and Procedures for 03/18/2023  OCT,  Retina - OU - Both Eyes       Right Eye Quality was good. Central Foveal Thickness: 286. Progression has no prior data. Findings include no SRF, abnormal foveal contour, retinal drusen , outer retinal tubulation, epiretinal membrane, inner retinal atrophy (Diffuse retinal atrophy, mild ERM with trace cystic changes nasal mac).   Left Eye Quality was good. Central Foveal Thickness: 307. Progression has no prior data. Findings include normal foveal contour, no IRF, no SRF, retinal drusen , epiretinal membrane, outer retinal atrophy (Diffuse outer retinal atrophy w/ sparing of central macula ).   Notes *Images captured and stored on drive  Diagnosis / Impression:  [id number] retinal atrophy, mild ERM with trace cystic changes nasal mac [id number] outer retinal atrophy w/ sparing of central macula   Clinical management:  See below  Abbreviations: NFP - Normal foveal profile. CME - cystoid macular edema. PED - pigment epithelial detachment. IRF - intraretinal fluid. SRF - subretinal fluid. EZ - ellipsoid zone. ERM - epiretinal membrane. ORA - outer retinal atrophy. ORT - outer retinal tubulation. SRHM - subretinal hyper-reflective material. IRHM - intraretinal hyper-reflective material      Fluorescein Angiography Optos (Transit OD)       Right Eye Progression has no prior data. Early phase findings include delayed filling, vascular perfusion defect (Delayed choroidal filling at 1 minute, arteriolar filling at 2 minutes ). Mid/Late phase findings include leakage, staining (Diffuse staining, severely attenuated fluorescein signal within vessels, late perifoveal leakage).   Left Eye Progression has no prior data. Early phase findings include staining, vascular perfusion defect. Mid/Late phase findings include staining, vascular perfusion defect (Mild peripheral vascular nonperfusion, no NV or leakage).   Notes *Images captured and stored on drive  Diagnosis / Impression:  OD: Delayed  choroidal filling at 1 minute, arteriolar filling at 2 minutes, Diffuse staining,  severely attenuated fluorescein signal within vessels, late perifoveal leakage, no NV OS: Mild peripheral vascular nonperfusion, no NV or leakage  Clinical management:  See below  Abbreviations: NFP - Normal foveal profile. CME - cystoid macular edema. PED - pigment epithelial detachment. IRF - intraretinal fluid. SRF - subretinal fluid. EZ - ellipsoid zone. ERM - epiretinal membrane. ORA - outer retinal atrophy. ORT - outer retinal tubulation. SRHM - subretinal hyper-reflective material. IRHM - intraretinal hyper-reflective material           ASSESSMENT/PLAN:   ICD-10-CM   1. Retinal macular atrophy  H35.89 OCT, Retina - OU - Both Eyes    2. Retinal ischemia  H35.82 OCT, Retina - OU - Both Eyes    Fluorescein Angiography Optos (Transit OD)    3. Ischemic optic neuropathy of right eye  H47.011     4. Choroidal nevus of right eye  D31.31     5. Epiretinal membrane (ERM) of both eyes  H35.373 OCT, Retina - OU - Both Eyes    6. Essential hypertension  I10     7. Hypertensive retinopathy of both eyes  H35.033     8. Pseudophakia of both eyes  Z96.1     9. Bilateral ocular hypertension  H40.053      1-3. Retinal atrophy OU secondary to retinal ischemia  - pt reports progressive vision loss OD starting around 2021 - pt w/ significant vascular history of DVT and PE -- patient is on Eliquis - BCVA OD HM, OS 20/25 - OCT shows OD: Diffuse retinal atrophy, mild ERM with trace cystic changes nasal mac; OS: Diffuse outer retinal atrophy sparing of central macula  - FA 07.23.24 shows OD: Delayed choroidal filling at 1 minute, arteriolar filling at 2 minutes, Diffuse staining, severely attenuated fluorescein signal within vessels, late perifoveal leakage, no NV; OS: Mild peripheral vascular nonperfusion, no NV or leakage - exam shows severely attenuated vessels and diffuse retinal atrophy OU; OD with 3-4+ disc  pallor and +rAPD--ischemic optic neuropathy - discussed findings, prognosis and permanent nature of vision loss secondary to ischemia - no retinal or ophthalmic interventions indicated or recommended  - recommend optimization of cardiovascular risk factors - f/u 4 months DFE, OCT  4. Choroidal Nevus, OD  - flat pigmented lesion ~1DD in size, just inferior to fovea  - no visual symptoms, no drusen, SRF or orange pigment  - thickness < 2mm  - discussed findings, prognosis  - recommend monitoring   5. Epiretinal membrane, OU - The natural history, anatomy, potential for loss of vision, and treatment options including vitrectomy techniques and the complications of endophthalmitis, retinal detachment, vitreous hemorrhage, cataract progression and permanent vision loss discussed with the patient. - mild ERM OU - BCVA OS 20/25 - no indication for surgery at this time - monitor for now - f/u 4 mos -- DFE/OCT   6,7. Hypertensive retinopathy OU - discussed importance of tight BP control - monitor   8. Pseudophakia OU  - s/p CE/IOL OU  - IOL in good position, doing well  - monitor   9. Ocular hypertension OU  - currently on simbrinza and brimonidine  - IOP 18, 17  - probably okay to be on just simbrinza  Ophthalmic Meds Ordered this visit:  No orders of the defined types were placed in this encounter.    Return in about 4 months (around 07/19/2023) for f/u retinal ischemia OU, DFE, OCT.  There are no Patient Instructions on file for this visit.  Explained the diagnoses, plan, and follow up with the patient and they expressed understanding.  Patient expressed understanding of the importance of proper follow up care.   This document serves as a record of services personally performed by Karie Chimera, MD, PhD. It was created on their behalf by Gerilyn Nestle, COT an ophthalmic technician. The creation of this record is the provider's dictation and/or activities during the visit.     Electronically signed by:  Charlette Caffey, COT  03/18/23 10:25 PM   Karie Chimera, M.D., Ph.D. Diseases & Surgery of the Retina and Vitreous [facility] 03/18/2023  I have reviewed the above documentation for accuracy and completeness, and I agree with the above. Karie Chimera, M.D., Ph.D. 03/18/23 10:25 PM  Abbreviations: M myopia (nearsighted); A astigmatism; H hyperopia (farsighted); P presbyopia; Mrx spectacle prescription;  CTL contact lenses; OD right eye; OS left eye; OU both eyes  XT exotropia; ET esotropia; PEK punctate epithelial keratitis; PEE punctate epithelial erosions; DES dry eye syndrome; MGD meibomian gland dysfunction; ATs artificial tears; PFAT's preservative free artificial tears; NSC nuclear sclerotic cataract; PSC posterior subcapsular cataract; ERM epi-retinal membrane; PVD posterior vitreous detachment; RD retinal detachment; DM diabetes mellitus; DR diabetic retinopathy; NPDR non-proliferative diabetic retinopathy; PDR proliferative diabetic retinopathy; CSME clinically significant macular edema; DME diabetic macular edema; dbh dot blot hemorrhages; CWS cotton wool spot; POAG primary open angle glaucoma; C/D cup-to-disc ratio; HVF humphrey visual field; GVF goldmann visual field; OCT optical coherence tomography; IOP intraocular pressure; BRVO Branch retinal vein occlusion; CRVO central retinal vein occlusion; CRAO central retinal artery occlusion; BRAO branch retinal artery occlusion; RT retinal tear; SB scleral buckle; PPV pars plana vitrectomy; VH Vitreous hemorrhage; PRP panretinal laser photocoagulation; IVK intravitreal kenalog; VMT vitreomacular traction; MH Macular hole;  NVD neovascularization of the disc; NVE neovascularization elsewhere; AREDS age related eye disease study; ARMD age related macular degeneration; POAG primary open angle glaucoma; EBMD epithelial/anterior basement membrane dystrophy; ACIOL anterior chamber intraocular  lens; IOL intraocular lens; PCIOL posterior chamber intraocular lens; Phaco/IOL phacoemulsification with intraocular lens placement; PRK photorefractive keratectomy; LASIK laser assisted in situ keratomileusis; HTN hypertension; DM diabetes mellitus; COPD chronic obstructive pulmonary disease

## 2023-03-18 ENCOUNTER — Ambulatory Visit (INDEPENDENT_AMBULATORY_CARE_PROVIDER_SITE_OTHER): Payer: Medicare Other | Admitting: Ophthalmology

## 2023-03-18 ENCOUNTER — Encounter (INDEPENDENT_AMBULATORY_CARE_PROVIDER_SITE_OTHER): Payer: Self-pay | Admitting: Ophthalmology

## 2023-03-18 DIAGNOSIS — H3582 Retinal ischemia: Secondary | ICD-10-CM

## 2023-03-18 DIAGNOSIS — I10 Essential (primary) hypertension: Secondary | ICD-10-CM

## 2023-03-18 DIAGNOSIS — H3589 Other specified retinal disorders: Secondary | ICD-10-CM

## 2023-03-18 DIAGNOSIS — D3131 Benign neoplasm of right choroid: Secondary | ICD-10-CM | POA: Diagnosis not present

## 2023-03-18 DIAGNOSIS — H3581 Retinal edema: Secondary | ICD-10-CM

## 2023-03-18 DIAGNOSIS — H35373 Puckering of macula, bilateral: Secondary | ICD-10-CM

## 2023-03-18 DIAGNOSIS — H35033 Hypertensive retinopathy, bilateral: Secondary | ICD-10-CM

## 2023-03-18 DIAGNOSIS — Z961 Presence of intraocular lens: Secondary | ICD-10-CM

## 2023-03-18 DIAGNOSIS — H40053 Ocular hypertension, bilateral: Secondary | ICD-10-CM

## 2023-03-18 DIAGNOSIS — H47011 Ischemic optic neuropathy, right eye: Secondary | ICD-10-CM

## 2023-07-03 NOTE — Progress Notes (Signed)
[facility] - Clinic Note  07/14/2023   CHIEF COMPLAINT Patient presents for Retina Follow Up  HISTORY OF PRESENT ILLNESS: Hector Bennett is a 77 y.o. male who presents to the clinic today for:  HPI     Retina Follow Up   Patient presents with  Other.  In both eyes.  This started 4 months ago.        Comments   Patient here for 4 months retina follow up for retinal atrophy OU. Patient states vision doing fine. Eye pressures down OU per Dr. Bascom Levels. Was pleased. Has new primary doctor. Dr. Dana Allan.      Last edited by Laddie Aquas, COA on 07/14/2023  8:21 AM.     Patient states the vision is about the same. He is using Glaucoma drops in both eyes now.   Referring physician: Frazier, [place], OD [address] [place],  [place] [address]  HISTORICAL INFORMATION:  Selected notes from the MEDICAL RECORD NUMBER Referred by Dr. Bascom Levels for retina eval -- concern for low vision / retinal ischemia OD LEE:  Ocular Hx- PMH-   CURRENT MEDICATIONS: Current Outpatient Medications (Ophthalmic Drugs)  Medication Sig   SIMBRINZA 1-0.2 % SUSP Apply 1 drop to eye 2 (two) times daily.   No current facility-administered medications for this visit. (Ophthalmic Drugs)   Current Outpatient Medications (Other)  Medication Sig   acetaminophen (TYLENOL) 650 MG CR tablet Take 650 mg by mouth every 8 (eight) hours as needed.   atenolol (TENORMIN) 50 MG tablet Take 1 tablet (50 mg total) by mouth every morning.   benazepril (LOTENSIN) 10 MG tablet Take 10 mg by mouth daily.   hydrochlorothiazide (HYDRODIURIL) 25 MG tablet Take 1 tablet (25 mg total) by mouth daily.   rivaroxaban (XARELTO) 20 MG TABS tablet Take 1 tablet (20 mg total) by mouth daily with supper.   Multiple Vitamins-Minerals (CENTRUM SILVER PO) Take by mouth daily.   No current facility-administered medications for this visit. (Other)   REVIEW OF SYSTEMS: ROS   Positive for:  Musculoskeletal, Cardiovascular, Eyes Last edited by Laddie Aquas, COA on 07/14/2023  8:22 AM.      ALLERGIES No Known Allergies PAST MEDICAL HISTORY Past Medical History:  Diagnosis Date   A-fib (HCC)    Arthritis    DVT (deep venous thrombosis) (HCC) 2007, 2009   Dysrhythmia    af   Hearing loss    Hyperlipidemia    Hypertension    Personal history of thrombophlebitis    Pulmonary embolism (HCC) 11/26/2007   Bilateral   Past Surgical History:  Procedure Laterality Date   CATARACT EXTRACTION W/PHACO Right 09/09/2019   Procedure: CATARACT EXTRACTION PHACO AND INTRAOCULAR LENS PLACEMENT (IOC) RIGHT;  Surgeon: Elliot Cousin, MD;  Location: [facility] ORS;  Service: Ophthalmology;  Laterality: Right;  [place] 01:07.3 CDE 8.71 Fluid Pack Lot # [id number]   CATARACT EXTRACTION W/PHACO Left 03/19/2021   Procedure: CATARACT EXTRACTION PHACO AND INTRAOCULAR LENS PLACEMENT (IOC) LEFT;  Surgeon: Galen Manila, MD;  Location: [facility];  Service: Ophthalmology;  Laterality: Left;  12.21 1:05.4   COLONOSCOPY WITH PROPOFOL N/A 10/20/2017   Procedure: COLONOSCOPY WITH PROPOFOL;  Surgeon: Wyline Mood, MD;  Location: [facility] ENDOSCOPY;  Service: Gastroenterology;  Laterality: N/A;   COLONOSCOPY WITH PROPOFOL N/A 03/02/2018   Procedure: COLONOSCOPY WITH PROPOFOL;  Surgeon: Wyline Mood, MD;  Location: [facility] ENDOSCOPY;  Service: Gastroenterology;  Laterality: N/A;   HAND SURGERY Left 11/09/2014  IVC FILTER INSERTION Right 10/05/2018   Procedure: IVC FILTER INSERTION;  Surgeon: Annice Needy, MD;  Location: [facility] INVASIVE CV LAB;  Service: Cardiovascular;  Laterality: Right;   IVC FILTER REMOVAL N/A 04/26/2019   Procedure: IVC FILTER REMOVAL;  Surgeon: Annice Needy, MD;  Location: [facility];  Service: Cardiovascular;  Laterality: N/A;   JOINT REPLACEMENT Bilateral    knee replacements   KNEE ARTHROPLASTY Left 07/31/2015   Procedure: COMPUTER ASSISTED TOTAL KNEE ARTHROPLASTY;  Surgeon: Donato Heinz, MD;  Location: [facility];  Service: Orthopedics;  Laterality: Left;   KNEE ARTHROPLASTY Right 10/12/2018   Procedure: COMPUTER ASSISTED TOTAL KNEE ARTHROPLASTY-RIGHT;  Surgeon: Donato Heinz, MD;  Location: [facility];  Service: Orthopedics;  Laterality: Right;   KNEE SURGERY  1975   PERIPHERAL VASCULAR CATHETERIZATION N/A 07/25/2015   Procedure: IVC Filter Insertion;  Surgeon: Renford Dills, MD;  Location: [facility];  Service: Cardiovascular;  Laterality: N/A;   PERIPHERAL VASCULAR CATHETERIZATION N/A 08/29/2015   Procedure: IVC Filter Removal;  Surgeon: Renford Dills, MD;  Location: [facility];  Service: Cardiovascular;  Laterality: N/A;   FAMILY HISTORY Family History  Problem Relation Age of Onset   Hypertension Mother    Hypertension Brother    Diabetes Brother    Dementia Brother    SOCIAL HISTORY Social History   Tobacco Use   Smoking status: Former    Types: Cigarettes   Smokeless tobacco: Never   Tobacco comments:    quit 40 years ago   Vaping Use   Vaping status: Never Used  Substance Use Topics   Alcohol use: Yes    Comment: occasional   Drug use: No       OPHTHALMIC EXAM:  Base Eye Exam     Visual Acuity (Snellen - Linear)       Right Left   Dist cc HM 20/25 -1    Correction: Glasses         Tonometry (Tonopen, 8:17 AM)       Right Left   Pressure 08 07         Pupils       Dark Light Shape React APD   Right 4 4 Round NR None   Left 3 2 Round Minimal None         Visual Fields       Left Right   Restrictions Total superior temporal, inferior temporal, superior nasal, inferior nasal deficiencies Partial outer superior temporal, inferior temporal, superior nasal, inferior nasal deficiencies         Extraocular Movement       Right Left    Full, Ortho Full, Ortho         Neuro/Psych     Oriented x3: Yes   Mood/Affect: Normal         Dilation     Both eyes: 1.0% Mydriacyl, 2.5%  Phenylephrine @ 8:17 AM           Slit Lamp and Fundus Exam     External Exam       Right Left   External Normal Normal         Slit Lamp Exam       Right Left   Lids/Lashes Dermatochalasis - upper lid, Meibomian gland dysfunction Dermatochalasis - upper lid, Meibomian gland dysfunction   Conjunctiva/Sclera White and quiet White and quiet   Cornea Arcus, Well healed temporal cataract wound, Debris in tear film,  Punctate epithelial erosions Arcus, Well healed temporal cataract wound, Debris in tear film, Punctate epithelial erosions   Anterior Chamber Deep and clear Deep and clear   Iris scattered TID 's at 2, 5, 8 oclock Round and dilated   Lens PC IOL in good postition, 1+ Posterior capsular opacification PC IOL in good postition, Posterior capsular opacification   Anterior Vitreous Vitreous syneresis, + fine cell/pigment, Vitreous condensations, Posterior vitreous detachment Vitreous syneresis, Vitreous condensations, Posterior vitreous detachment         Fundus Exam       Right Left   Disc 3-4+ pallor, sharp rim, attenuated vessels 1-2+ pallor, sharp rim   C/D Ratio 0.75 0.5   Macula Flat, Blunted foveal reflex, diffuse atrphy, flat nevus inferior to fovea 1DD, no heme/SRF/or orange pigment, RPE mottling, ERM Flat, Blunted foveal reflex, mild ERM, diffuse atrophy, no heme/edmea   Vessels Severe Vascular attenuation Severe Vascular attenuation, Tortuous   Periphery Attached, diffused atrophy, no heme Attached, No heme, diffused atrophy           Refraction     Wearing Rx       Sphere Cylinder Axis Add   Right +0.25 +1.00 174 +2.50   Left -0.50 +1.50 003 +2.50           IMAGING AND PROCEDURES  Imaging and Procedures for 07/14/2023         ASSESSMENT/PLAN:   ICD-10-CM   1. Retinal macular atrophy  H35.89     2. Retinal ischemia  H35.82     3. Ischemic optic neuropathy of right eye  H47.011     4. Choroidal nevus of right eye  D31.31     5.  Epiretinal membrane (ERM) of both eyes  H35.373 OCT, Retina - OU - Both Eyes    6. Essential hypertension  I10     7. Hypertensive retinopathy of both eyes  H35.033     8. Pseudophakia of both eyes  Z96.1     9. Bilateral ocular hypertension  H40.053      1-3. Retinal atrophy OU secondary to retinal ischemia  - pt reports progressive vision loss OD starting around 2021 - pt w/ significant vascular history of DVT and PE -- patient is on Eliquis - BCVA OD HM, OS 20/25 - OCT shows OD: Diffuse retinal atrophy, mild ERM with trace cystic changes nasal mac; OS: Diffuse outer retinal atrophy sparing of central macula  - FA 07.23.24 shows OD: Delayed choroidal filling at 1 minute, arteriolar filling at 2 minutes, Diffuse staining, severely attenuated fluorescein signal within vessels, late perifoveal leakage, no NV; OS: Mild peripheral vascular nonperfusion, no NV or leakage - exam shows severely attenuated vessels and diffuse retinal atrophy OU; OD with 3-4+ disc pallor and +rAPD--ischemic optic neuropathy - discussed findings, prognosis and permanent nature of vision loss secondary to ischemia - no retinal or ophthalmic interventions indicated or recommended  - recommend optimization of cardiovascular risk factors - f/u 6-9 months DFE, OCT  4. Choroidal Nevus, OD  - flat pigmented lesion ~1DD in size, just inferior to fovea  - no visual symptoms, no drusen, SRF or orange pigment  - thickness < 2mm  - discussed findings, prognosis  - recommend monitoring   5. Epiretinal membrane, OU - mild ERM OU - BCVA OS 20/25 - no indication for surgery at this time - monitor for now - f/u 4 mos -- DFE/OCT   6,7. Hypertensive retinopathy OU - discussed importance of tight BP control -  monitor   8. Pseudophakia OU  - s/p CE/IOL OU  - IOL in good position, doing well  - PCO OU OD>OS  - monitor   9. Ocular hypertension OU  - currently on simbrinza and brimonidine  - IOP 08, 07  - probably  okay to be on just Simbrinza  Ophthalmic Meds Ordered this visit:  No orders of the defined types were placed in this encounter.    No follow-ups on file.  There are no Patient Instructions on file for this visit.  Explained the diagnoses, plan, and follow up with the patient and they expressed understanding.  Patient expressed understanding of the importance of proper follow up care.   This document serves as a record of services personally performed by Karie Chimera, MD, PhD. It was created on their behalf by Glee Arvin. Manson Passey, OA an ophthalmic technician. The creation of this record is the provider's dictation and/or activities during the visit.    Electronically signed by: Glee Arvin. Manson Passey, OA 07/14/23 8:36 AM  This document serves as a record of services personally performed by Karie Chimera, MD, PhD. It was created on their behalf by Charlette Caffey, COT an ophthalmic technician. The creation of this record is the provider's dictation and/or activities during the visit.    Electronically signed by:  Charlette Caffey, COT  07/14/23 8:36 AM   Karie Chimera, M.D., Ph.D. Diseases & Surgery of the Retina and Vitreous [facility] 07/14/2023    Abbreviations: M myopia (nearsighted); A astigmatism; H hyperopia (farsighted); P presbyopia; Mrx spectacle prescription;  CTL contact lenses; OD right eye; OS left eye; OU both eyes  XT exotropia; ET esotropia; PEK punctate epithelial keratitis; PEE punctate epithelial erosions; DES dry eye syndrome; MGD meibomian gland dysfunction; ATs artificial tears; PFAT's preservative free artificial tears; NSC nuclear sclerotic cataract; PSC posterior subcapsular cataract; ERM epi-retinal membrane; PVD posterior vitreous detachment; RD retinal detachment; DM diabetes mellitus; DR diabetic retinopathy; NPDR non-proliferative diabetic retinopathy; PDR proliferative diabetic retinopathy; CSME clinically significant macular  edema; DME diabetic macular edema; dbh dot blot hemorrhages; CWS cotton wool spot; POAG primary open angle glaucoma; C/D cup-to-disc ratio; HVF humphrey visual field; GVF goldmann visual field; OCT optical coherence tomography; IOP intraocular pressure; BRVO Branch retinal vein occlusion; CRVO central retinal vein occlusion; CRAO central retinal artery occlusion; BRAO branch retinal artery occlusion; RT retinal tear; SB scleral buckle; PPV pars plana vitrectomy; VH Vitreous hemorrhage; PRP panretinal laser photocoagulation; IVK intravitreal kenalog; VMT vitreomacular traction; MH Macular hole;  NVD neovascularization of the disc; NVE neovascularization elsewhere; AREDS age related eye disease study; ARMD age related macular degeneration; POAG primary open angle glaucoma; EBMD epithelial/anterior basement membrane dystrophy; ACIOL anterior chamber intraocular lens; IOL intraocular lens; PCIOL posterior chamber intraocular lens; Phaco/IOL phacoemulsification with intraocular lens placement; PRK photorefractive keratectomy; LASIK laser assisted in situ keratomileusis; HTN hypertension; DM diabetes mellitus; COPD chronic obstructive pulmonary disease

## 2023-07-08 ENCOUNTER — Ambulatory Visit: Payer: Medicare Other | Admitting: Family Medicine

## 2023-07-08 ENCOUNTER — Encounter: Payer: Self-pay | Admitting: Family Medicine

## 2023-07-08 VITALS — BP 118/68 | HR 64 | Temp 98.2°F | Resp 16 | Ht 72.0 in | Wt 326.1 lb

## 2023-07-08 DIAGNOSIS — I10 Essential (primary) hypertension: Secondary | ICD-10-CM

## 2023-07-08 DIAGNOSIS — D6869 Other thrombophilia: Secondary | ICD-10-CM

## 2023-07-08 DIAGNOSIS — E559 Vitamin D deficiency, unspecified: Secondary | ICD-10-CM

## 2023-07-08 DIAGNOSIS — R7309 Other abnormal glucose: Secondary | ICD-10-CM

## 2023-07-08 DIAGNOSIS — E538 Deficiency of other specified B group vitamins: Secondary | ICD-10-CM | POA: Diagnosis not present

## 2023-07-08 DIAGNOSIS — R3912 Poor urinary stream: Secondary | ICD-10-CM

## 2023-07-08 DIAGNOSIS — I872 Venous insufficiency (chronic) (peripheral): Secondary | ICD-10-CM

## 2023-07-08 DIAGNOSIS — I82511 Chronic embolism and thrombosis of right femoral vein: Secondary | ICD-10-CM

## 2023-07-08 DIAGNOSIS — M1990 Unspecified osteoarthritis, unspecified site: Secondary | ICD-10-CM

## 2023-07-08 DIAGNOSIS — N401 Enlarged prostate with lower urinary tract symptoms: Secondary | ICD-10-CM

## 2023-07-08 DIAGNOSIS — Z23 Encounter for immunization: Secondary | ICD-10-CM

## 2023-07-08 DIAGNOSIS — I4811 Longstanding persistent atrial fibrillation: Secondary | ICD-10-CM

## 2023-07-08 DIAGNOSIS — E785 Hyperlipidemia, unspecified: Secondary | ICD-10-CM

## 2023-07-08 DIAGNOSIS — Z1159 Encounter for screening for other viral diseases: Secondary | ICD-10-CM

## 2023-07-08 NOTE — Patient Instructions (Signed)
It was a pleasure meeting you today. Thank you for allowing me to take part in your health care.  Our goals for today as we discussed include:  Will review records and make recommendations for next visit  Schedule lab appointment. Fast for 10 hours    This is a list of the screening recommended for you and due dates:  Health Maintenance  Topic Date Due   Zoster (Shingles) Vaccine (1 of 2) Never done   Pneumonia Vaccine (2 of 2 - PCV) 03/16/2020   Medicare Annual Wellness Visit  08/28/2021   COVID-19 Vaccine (2 - 2023-24 season) 07/25/2023   DTaP/Tdap/Td vaccine (3 - Td or Tdap) 03/06/2030   Flu Shot  Completed   Hepatitis C Screening  Completed   HPV Vaccine  Aged Out   Colon Cancer Screening  Discontinued      If you have any questions or concerns, please do not hesitate to call the office at [phone].  I look forward to our next visit and until then take care and stay safe.  Regards,   Dana Allan, MD   [facility]

## 2023-07-08 NOTE — Progress Notes (Unsigned)
SUBJECTIVE:   Chief Complaint  Patient presents with   Establish Care   HPI Presents to establish care  Discussed the use of AI scribe software for clinical note transcription with the patient, who gave verbal consent to proceed.  History of Present Illness The patient, previously under the care of Dr. Sampson Goon, presents for a new patient consultation due to a change in insurance. He has a history of severe arthritis, for which he takes Tylenol daily. Surgery for the arthritis was deemed inappropriate due to a history of blood clots. The patient also has a history of hypertension, managed with Atenolol 50mg  and Benazepril 10mg , both previously prescribed by Dr. Sampson Goon. He also takes Hydrochlorothiazide 25mg  for blood pressure control.  The patient has a history of a deep vein thrombosis (DVT) in the right leg, which subsequently led to a pulmonary embolism. 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  He has a history of elevated cholesterol, but he discontinued his cholesterol medication within the last year due to muscle pain and a belief that his cholesterol levels were not significantly high.  The patient also has a history of eye issues, including cataracts and glaucoma, and is currently using the eye drop, Sunbrella. He previously used Lantoprost, but this was discontinued.  The patient reports some difficulty with urination, describing a feeling of pressure and an inability to urinate when he feels he needs to. He also reports itching in the right ear, which has been ongoing for some time. The patient has a history of wax build-up in this ear.  The patient has a history of pneumonia and has received the pneumonia vaccine. He has not received the flu vaccine due to concerns about blood clots.  The patient has not had a prostate check in approximately six to seven years. He reports some  swelling in the legs, particularly the right leg, which was the site of the previous DVT. The patient has not had a colonoscopy in over ten years.    PERTINENT PMH / PSH: As above  OBJECTIVE:  BP 118/68   Pulse 64   Temp 98.2 F (36.8 C)   Resp 16   Ht 6' (1.829 m)   Wt (!) 326 lb 2 oz (147.9 kg)   SpO2 99%   BMI 44.23 kg/m    Physical Exam Vitals reviewed.  Constitutional:      Appearance: He is obese.  HENT:     Head: Normocephalic.     Right Ear: Tympanic membrane, ear canal and external ear normal.     Left Ear: Tympanic membrane, ear canal and external ear normal.     Nose: Nose normal.     Mouth/Throat:     Mouth: Mucous membranes are moist.  Eyes:     Conjunctiva/sclera: Conjunctivae normal.     Pupils: Pupils are equal, round, and reactive to light.  Neck:     Thyroid: No thyromegaly or thyroid tenderness.     Vascular: No carotid bruit.  Cardiovascular:     Rate and Rhythm: Normal rate and regular rhythm.     Pulses: Normal pulses.     Heart sounds: Normal heart sounds.  Pulmonary:     Effort: Pulmonary effort is normal.     Breath sounds: Normal breath sounds.  Abdominal:     General: Abdomen is flat. Bowel sounds are normal.     Palpations: Abdomen is soft.  Musculoskeletal:        General: Normal range of motion.     Cervical back: Normal range of motion and neck supple.     Right lower leg: No edema.     Left lower leg: No edema.  Lymphadenopathy:     Cervical: No cervical adenopathy.  Neurological:     Mental Status: He is alert.  Psychiatric:        Mood and Affect: Mood normal.        Behavior: Behavior normal.        Thought Content: Thought content normal.        Judgment: Judgment normal.        07/08/2023   10:37 AM 03/06/2020    8:12 AM 03/17/2019    3:28 PM 11/17/2017    1:42 PM 05/19/2017    3:25 PM  Depression screen PHQ 2/9  Decreased Interest 0 0 0 0 0  Down, Depressed, Hopeless 0 0 0 0 0  PHQ - 2 Score 0 0 0 0 0  Altered  sleeping 0      Tired, decreased energy 0      Change in appetite 0      Feeling bad or failure about yourself  0      Trouble concentrating 0      Moving slowly or fidgety/restless 0      Suicidal thoughts 0      PHQ-9 Score 0      Difficult doing work/chores Not difficult at all          07/08/2023   10:37 AM  GAD 7 : Generalized Anxiety Score  Nervous, Anxious, on Edge 0  Control/stop worrying 0  Worry too much - different things 0  Trouble relaxing 0  Restless 0  Easily annoyed or irritable 0  Afraid - awful might happen 0  Total GAD 7 Score 0  Anxiety Difficulty Not difficult at all    ASSESSMENT/PLAN:  Longstanding persistent atrial fibrillation (HCC) -     CBC with Differential/Platelet; Future -     TSH; Future  Morbid obesity (HCC) Assessment & Plan: BMI >44 with existing conditions -Would benefti from GLP1 for weight loss -Check labs -Encourage healthy lifestyle   Vitamin B 12 deficiency -     Vitamin B12; Future  Vitamin D deficiency -     VITAMIN D 25 Hydroxy (Vit-D Deficiency, Fractures); Future  Hyperlipidemia, unspecified hyperlipidemia type Assessment & Plan: Discontinued statin therapy due to muscle pain. -No current plan to restart statin therapy.  Orders: -     Lipid panel; Future  Benign prostatic hyperplasia with weak urinary stream Assessment & Plan: Reports occasional difficulty with urination.  Orders: -     Urinalysis, Routine w reflex microscopic; Future  Abnormal glucose -     Hemoglobin A1c; Future  Primary hypertension Assessment & Plan: Long-standing history, managed with Atenolol 50mg  and Benazepril 10mg . -Continue Atenolol 50mg  and Benazepril 10mg .  Orders: -     Comprehensive metabolic panel; Future  Need for hepatitis C screening test -     Hepatitis C antibody; Future  Other thrombophilia (HCC) -     IBC + Ferritin; Future  Need for influenza vaccination -     Flu Vaccine Trivalent High Dose  (Fluad)  Arthritis Assessment & Plan: Daily pain managed with Tylenol. Surgery not an option due to history of blood clots. -Continue Tylenol as needed for pain.   Chronic deep vein thrombosis (DVT) of femoral vein  of right lower extremity (HCC) Assessment & Plan: History of provoked DVT in right leg, managed with Xarelto. -Continue Xarelto.   Chronic venous insufficiency Assessment & Plan: Chronic.  Noted on physical exam. -Encouraged weight loss and healthy diet -Would benefit from compression stockings -Continue Xarelto     General Health Maintenance -Consider Pneumonia vaccine (Pneumovax 20). -Follow-up appointment after lab results are available.    PDMP reviewed  Return in about 4 weeks (around 08/05/2023), or if symptoms worsen or fail to improve.  Dana Allan, MD

## 2023-07-11 DIAGNOSIS — Z1159 Encounter for screening for other viral diseases: Secondary | ICD-10-CM | POA: Insufficient documentation

## 2023-07-11 DIAGNOSIS — E538 Deficiency of other specified B group vitamins: Secondary | ICD-10-CM | POA: Insufficient documentation

## 2023-07-11 DIAGNOSIS — E559 Vitamin D deficiency, unspecified: Secondary | ICD-10-CM | POA: Insufficient documentation

## 2023-07-11 DIAGNOSIS — Z23 Encounter for immunization: Secondary | ICD-10-CM | POA: Insufficient documentation

## 2023-07-11 DIAGNOSIS — R7309 Other abnormal glucose: Secondary | ICD-10-CM | POA: Insufficient documentation

## 2023-07-11 NOTE — Assessment & Plan Note (Signed)
Daily pain managed with Tylenol. Surgery not an option due to history of blood clots. -Continue Tylenol as needed for pain.

## 2023-07-11 NOTE — Assessment & Plan Note (Signed)
History of provoked DVT in right leg, managed with Xarelto. -Continue Xarelto.

## 2023-07-11 NOTE — Assessment & Plan Note (Signed)
Noted on physical exam. -Encouraged weight loss and healthy diet -Would benefit from compression stockings -Continue Xarelto

## 2023-07-11 NOTE — Assessment & Plan Note (Signed)
Long-standing history, managed with Atenolol 50mg  and Benazepril 10mg . -Continue Atenolol 50mg  and Benazepril 10mg .

## 2023-07-11 NOTE — Assessment & Plan Note (Signed)
Discontinued statin therapy due to muscle pain. -No current plan to restart statin therapy.

## 2023-07-11 NOTE — Assessment & Plan Note (Signed)
Reports occasional difficulty with urination.

## 2023-07-14 ENCOUNTER — Encounter: Payer: Self-pay | Admitting: Family Medicine

## 2023-07-14 ENCOUNTER — Ambulatory Visit (INDEPENDENT_AMBULATORY_CARE_PROVIDER_SITE_OTHER): Payer: Medicare Other | Admitting: Ophthalmology

## 2023-07-14 ENCOUNTER — Encounter (INDEPENDENT_AMBULATORY_CARE_PROVIDER_SITE_OTHER): Payer: Self-pay | Admitting: Ophthalmology

## 2023-07-14 DIAGNOSIS — H3582 Retinal ischemia: Secondary | ICD-10-CM | POA: Diagnosis not present

## 2023-07-14 DIAGNOSIS — Z961 Presence of intraocular lens: Secondary | ICD-10-CM

## 2023-07-14 DIAGNOSIS — H35033 Hypertensive retinopathy, bilateral: Secondary | ICD-10-CM

## 2023-07-14 DIAGNOSIS — I10 Essential (primary) hypertension: Secondary | ICD-10-CM | POA: Diagnosis not present

## 2023-07-14 DIAGNOSIS — H47011 Ischemic optic neuropathy, right eye: Secondary | ICD-10-CM

## 2023-07-14 DIAGNOSIS — D3131 Benign neoplasm of right choroid: Secondary | ICD-10-CM | POA: Diagnosis not present

## 2023-07-14 DIAGNOSIS — H3589 Other specified retinal disorders: Secondary | ICD-10-CM

## 2023-07-14 DIAGNOSIS — H35373 Puckering of macula, bilateral: Secondary | ICD-10-CM | POA: Diagnosis not present

## 2023-07-14 DIAGNOSIS — H40053 Ocular hypertension, bilateral: Secondary | ICD-10-CM

## 2023-07-14 NOTE — Assessment & Plan Note (Signed)
BMI >44 with existing conditions -Would benefti from GLP1 for weight loss -Check labs -Encourage healthy lifestyle

## 2023-07-15 ENCOUNTER — Encounter (INDEPENDENT_AMBULATORY_CARE_PROVIDER_SITE_OTHER): Payer: Self-pay | Admitting: Ophthalmology

## 2023-07-21 ENCOUNTER — Telehealth: Payer: Self-pay | Admitting: Family Medicine

## 2023-07-21 ENCOUNTER — Other Ambulatory Visit: Payer: Medicare Other

## 2023-07-21 DIAGNOSIS — I10 Essential (primary) hypertension: Secondary | ICD-10-CM

## 2023-07-21 DIAGNOSIS — I4811 Longstanding persistent atrial fibrillation: Secondary | ICD-10-CM

## 2023-07-21 DIAGNOSIS — E559 Vitamin D deficiency, unspecified: Secondary | ICD-10-CM | POA: Diagnosis not present

## 2023-07-21 DIAGNOSIS — R3912 Poor urinary stream: Secondary | ICD-10-CM | POA: Diagnosis not present

## 2023-07-21 DIAGNOSIS — D6869 Other thrombophilia: Secondary | ICD-10-CM | POA: Diagnosis not present

## 2023-07-21 DIAGNOSIS — R7309 Other abnormal glucose: Secondary | ICD-10-CM

## 2023-07-21 DIAGNOSIS — E538 Deficiency of other specified B group vitamins: Secondary | ICD-10-CM

## 2023-07-21 DIAGNOSIS — E785 Hyperlipidemia, unspecified: Secondary | ICD-10-CM

## 2023-07-21 DIAGNOSIS — N401 Enlarged prostate with lower urinary tract symptoms: Secondary | ICD-10-CM | POA: Diagnosis not present

## 2023-07-21 DIAGNOSIS — Z1159 Encounter for screening for other viral diseases: Secondary | ICD-10-CM

## 2023-07-21 LAB — IBC + FERRITIN
Ferritin: 119.8 ng/mL (ref 22.0–322.0)
Iron: 82 ug/dL (ref 42–165)
Saturation Ratios: 20.1 % (ref 20.0–50.0)
TIBC: 407.4 ug/dL (ref 250.0–450.0)
Transferrin: 291 mg/dL (ref 212.0–360.0)

## 2023-07-21 LAB — VITAMIN D 25 HYDROXY (VIT D DEFICIENCY, FRACTURES): VITD: 25.22 ng/mL — ABNORMAL LOW (ref 30.00–100.00)

## 2023-07-21 LAB — COMPREHENSIVE METABOLIC PANEL
ALT: 13 U/L (ref 0–53)
AST: 17 U/L (ref 0–37)
Albumin: 4.2 g/dL (ref 3.5–5.2)
Alkaline Phosphatase: 66 U/L (ref 39–117)
BUN: 13 mg/dL (ref 6–23)
CO2: 27 meq/L (ref 19–32)
Calcium: 9.5 mg/dL (ref 8.4–10.5)
Chloride: 102 meq/L (ref 96–112)
Creatinine, Ser: 0.88 mg/dL (ref 0.40–1.50)
GFR: 81.79 mL/min (ref >60.00)
Glucose, Bld: 112 mg/dL — ABNORMAL HIGH (ref 70–99)
Potassium: 4.9 meq/L (ref 3.5–5.1)
Sodium: 137 meq/L (ref 135–145)
Total Bilirubin: 0.9 mg/dL (ref 0.2–1.2)
Total Protein: 6.7 g/dL (ref 6.0–8.3)

## 2023-07-21 LAB — URINALYSIS, ROUTINE W REFLEX MICROSCOPIC
Bilirubin Urine: NEGATIVE
Hgb urine dipstick: NEGATIVE
Ketones, ur: NEGATIVE
Nitrite: NEGATIVE
RBC / HPF: NONE SEEN (ref 0–?)
Specific Gravity, Urine: 1.02 (ref 1.000–1.030)
Total Protein, Urine: NEGATIVE
Urine Glucose: NEGATIVE
Urobilinogen, UA: 0.2 (ref 0.0–1.0)
pH: 6 (ref 5.0–8.0)

## 2023-07-21 LAB — TSH: TSH: 2.89 u[IU]/mL (ref 0.35–5.50)

## 2023-07-21 LAB — CBC WITH DIFFERENTIAL/PLATELET
Basophils Absolute: 0 10*3/uL (ref 0.0–0.1)
Basophils Relative: 0.4 % (ref 0.0–3.0)
Eosinophils Absolute: 0.2 10*3/uL (ref 0.0–0.7)
Eosinophils Relative: 2.2 % (ref 0.0–5.0)
HCT: 45.3 % (ref 39.0–52.0)
Hemoglobin: 14.8 g/dL (ref 13.0–17.0)
Lymphocytes Relative: 14.1 % (ref 12.0–46.0)
Lymphs Abs: 1.2 10*3/uL (ref 0.7–4.0)
MCHC: 32.7 g/dL (ref 30.0–36.0)
MCV: 93.6 fL (ref 78.0–100.0)
Monocytes Absolute: 0.5 10*3/uL (ref 0.1–1.0)
Monocytes Relative: 5.9 % (ref 3.0–12.0)
Neutro Abs: 6.7 10*3/uL (ref 1.4–7.7)
Neutrophils Relative %: 77.4 % — ABNORMAL HIGH (ref 43.0–77.0)
Platelets: 221 10*3/uL (ref 150.0–400.0)
RBC: 4.84 Mil/uL (ref 4.22–5.81)
RDW: 13.8 % (ref 11.5–15.5)
WBC: 8.7 10*3/uL (ref 4.0–10.5)

## 2023-07-21 LAB — VITAMIN B12: Vitamin B-12: 306 pg/mL (ref 211–911)

## 2023-07-21 LAB — LIPID PANEL
Cholesterol: 179 mg/dL (ref 0–200)
HDL: 42.8 mg/dL (ref >39.00)
LDL Cholesterol: 118 mg/dL — ABNORMAL HIGH (ref 0–99)
NonHDL: 136.23
Total CHOL/HDL Ratio: 4
Triglycerides: 91 mg/dL (ref 0.0–149.0)
VLDL: 18.2 mg/dL (ref 0.0–40.0)

## 2023-07-21 LAB — HEMOGLOBIN A1C: Hgb A1c MFr Bld: 6.1 % (ref 4.6–6.5)

## 2023-07-21 NOTE — Telephone Encounter (Signed)
Patient's wife called and stated that patient if coming in for labs today.   Prescription Request  07/21/2023  LOV: 07/08/2023  What is the name of the medication  benazepril (LOTENSIN) 10 MG tablet and atenolol (TENORMIN) 50 MG tablet  Patient only has a few days remaining of medicaiton.   Have you contacted your pharmacy to request a refill? Yes   Which pharmacy would you like this sent to?  [facility] - [place], [place] - [address] [place] [address] Phone: [phone] Fax: [phone]    Patient notified that their request is being sent to the clinical staff for review and that they should receive a response within 2 business days.   Please advise at [facility] [phone]

## 2023-07-22 ENCOUNTER — Other Ambulatory Visit: Payer: Self-pay

## 2023-07-22 DIAGNOSIS — I10 Essential (primary) hypertension: Secondary | ICD-10-CM

## 2023-07-22 DIAGNOSIS — I4819 Other persistent atrial fibrillation: Secondary | ICD-10-CM

## 2023-07-22 LAB — HEPATITIS C ANTIBODY: Hepatitis C Ab: NONREACTIVE

## 2023-07-22 MED ORDER — RIVAROXABAN 20 MG PO TABS: 20.0000 mg | ORAL_TABLET | Freq: Every day | ORAL | 1 refills | Status: DC

## 2023-07-22 NOTE — Telephone Encounter (Signed)
Refill request sent to Dr. Lorin Picket the Dr. Rennis Petty the day Pt stated that they only had 3 days left.

## 2023-07-22 NOTE — Telephone Encounter (Signed)
Called pt and left message on vm to let them know rx has been sent to the pharmacy.

## 2023-07-22 NOTE — Telephone Encounter (Signed)
Called and spoke to pt wife and she stated that pt only has 3 days left of medication. Pt seen Dr. Clent Ridges on 07/08/2023 as a new Pt. Last filled by old provider on Mick Sell, MD . Pt request 30 days supply of the Xarelto due to the cost.

## 2023-07-22 NOTE — Telephone Encounter (Signed)
Patient's wife called and wanted to know the update for his prescription. She said they called the pharmacy and his refill is not there yet. Can someone call him at [phone].

## 2023-07-22 NOTE — Telephone Encounter (Signed)
Rx ok'd for xarelto.  Please notify rx has been sent in.

## 2023-07-25 NOTE — Telephone Encounter (Signed)
Per Idalia Needle (pharmacist at [facility]) - pt's wife (I believe) picked up his rx for benazepril and atenolol on 07/22/23.

## 2023-07-26 ENCOUNTER — Other Ambulatory Visit: Payer: Self-pay | Admitting: Family Medicine

## 2023-07-26 DIAGNOSIS — E559 Vitamin D deficiency, unspecified: Secondary | ICD-10-CM

## 2023-07-26 MED ORDER — VITAMIN D (ERGOCALCIFEROL) 1.25 MG (50000 UNIT) PO CAPS: 50000.0000 [IU] | ORAL_CAPSULE | ORAL | 1 refills | Status: DC

## 2023-08-11 ENCOUNTER — Encounter: Payer: Self-pay | Admitting: Family Medicine

## 2023-08-11 ENCOUNTER — Ambulatory Visit: Payer: Medicare Other | Admitting: Family Medicine

## 2023-08-11 VITALS — BP 112/64 | HR 66 | Temp 98.0°F | Resp 18 | Ht 72.0 in | Wt 322.1 lb

## 2023-08-11 DIAGNOSIS — Z23 Encounter for immunization: Secondary | ICD-10-CM | POA: Diagnosis not present

## 2023-08-11 DIAGNOSIS — I82511 Chronic embolism and thrombosis of right femoral vein: Secondary | ICD-10-CM | POA: Diagnosis not present

## 2023-08-11 DIAGNOSIS — E559 Vitamin D deficiency, unspecified: Secondary | ICD-10-CM

## 2023-08-11 DIAGNOSIS — I10 Essential (primary) hypertension: Secondary | ICD-10-CM

## 2023-08-11 DIAGNOSIS — E785 Hyperlipidemia, unspecified: Secondary | ICD-10-CM

## 2023-08-11 DIAGNOSIS — R7303 Prediabetes: Secondary | ICD-10-CM

## 2023-08-11 DIAGNOSIS — R040 Epistaxis: Secondary | ICD-10-CM

## 2023-08-11 DIAGNOSIS — I4819 Other persistent atrial fibrillation: Secondary | ICD-10-CM

## 2023-08-11 MED ORDER — EZETIMIBE 10 MG PO TABS: 10.0000 mg | ORAL_TABLET | Freq: Every day | ORAL | 3 refills | Status: DC

## 2023-08-11 MED ORDER — RIVAROXABAN 20 MG PO TABS: 20.0000 mg | ORAL_TABLET | Freq: Every day | ORAL | 3 refills | Status: DC

## 2023-08-11 NOTE — Progress Notes (Signed)
SUBJECTIVE:   Chief Complaint  Patient presents with   Medical Management of Chronic Issues    4 week follow up    HPI Presents for follow up chronic disease management  Discussed the use of AI scribe software for clinical note transcription with the patient, who gave verbal consent to proceed.  History of Present Illness The patient, with a history of deep vein thrombosis (DVT), atrial fibrillation (AFib), and hypertension, presented with a new onset of nosebleeds. These episodes occurred over three consecutive days, each lasting approximately ten minutes. The nosebleeds were described as significant and occurred spontaneously, without any known provocation such as nose blowing or scratching. The patient's home environment was noted to be dry due to gas heat, and a humidifier was recently introduced. The patient is on Xarelto, a blood thinner, which likely contributed to the duration of the nosebleeds.  The patient also has a history of hyperlipidemia, previously managed with a statin, which was discontinued due to muscle side effects. The patient's cholesterol levels remain elevated despite discontinuation of the statin.  The patient's DVT was reportedly provoked by a period of immobility due to pneumonia and subsequent return to work as a [employer]. The patient was initially managed with warfarin for many years but was switched to Xarelto due to difficulty maintaining therapeutic INR levels. The patient's AFib was also noted as a reason for continued anticoagulation.  The patient's other medications include a blood pressure medication and a weekly dose of vitamin D. The patient's appetite is reportedly good, and there are no complaints of chest pain, shortness of breath, or abdominal pain.    PERTINENT PMH / PSH: As above  OBJECTIVE:  BP 112/64   Pulse 66   Temp 98 F (36.7 C)   Resp 18   Ht 6' (1.829 m)   Wt (!) 322 lb 2 oz (146.1 kg)   SpO2 99%   BMI 43.69 kg/m     Physical Exam Vitals reviewed.  Constitutional:      General: He is not in acute distress.    Appearance: Normal appearance. He is obese. He is not ill-appearing, toxic-appearing or diaphoretic.  Eyes:     General:        Right eye: No discharge.        Left eye: No discharge.  Cardiovascular:     Rate and Rhythm: Normal rate and regular rhythm.     Heart sounds: Normal heart sounds.  Pulmonary:     Effort: Pulmonary effort is normal.     Breath sounds: Normal breath sounds.  Abdominal:     General: Bowel sounds are normal.  Musculoskeletal:        General: Normal range of motion.     Cervical back: Normal range of motion.  Skin:    General: Skin is warm and dry.  Neurological:     Mental Status: He is alert and oriented to person, place, and time. Mental status is at baseline.  Psychiatric:        Mood and Affect: Mood normal.        Behavior: Behavior normal.        Thought Content: Thought content normal.        Judgment: Judgment normal.        07/08/2023   10:37 AM 03/06/2020    8:12 AM 03/17/2019    3:28 PM 11/17/2017    1:42 PM 05/19/2017    3:25 PM  Depression screen PHQ 2/9  Decreased  Interest 0 0 0 0 0  Down, Depressed, Hopeless 0 0 0 0 0  PHQ - 2 Score 0 0 0 0 0  Altered sleeping 0      Tired, decreased energy 0      Change in appetite 0      Feeling bad or failure about yourself  0      Trouble concentrating 0      Moving slowly or fidgety/restless 0      Suicidal thoughts 0      PHQ-9 Score 0      Difficult doing work/chores Not difficult at all          07/08/2023   10:37 AM  GAD 7 : Generalized Anxiety Score  Nervous, Anxious, on Edge 0  Control/stop worrying 0  Worry too much - different things 0  Trouble relaxing 0  Restless 0  Easily annoyed or irritable 0  Afraid - awful might happen 0  Total GAD 7 Score 0  Anxiety Difficulty Not difficult at all    ASSESSMENT/PLAN:  Hyperlipidemia, unspecified hyperlipidemia type Assessment &  Plan: LDL cholesterol at 118, not currently on any cholesterol medications due to previous muscle aches. -Start Zetia to lower LDL cholesterol. -Encourage dietary modifications to lower cholesterol intake.  Orders: -     Ezetimibe; Take 1 tablet (10 mg total) by mouth daily.  Dispense: 90 tablet; Refill: 3  Persistent atrial fibrillation (HCC) Assessment & Plan: On Xarelto for anticoagulation. Recent episode sudden onset epistaxis that self resolved. -Discontinue Xarelto. -Start Eliquis 5 mg BID for decreased risk of bleeding  Orders: -     Apixaban; Take 1 tablet (5 mg total) by mouth 2 (two) times daily.  Dispense: 60 tablet; Refill: 11  Encounter for immunization -     Pneumococcal conjugate vaccine 20-valent  Chronic deep vein thrombosis (DVT) of femoral vein of right lower extremity (HCC) Assessment & Plan: Discontinue Xarelto Start Eliquis 5 mg BID   Primary hypertension Assessment & Plan: Long-standing history, managed with CCB, ACEi and diuretic.  Well controlled. Recent labs normal -Refill Atenolol 50 mg daily -Refill Benazepril 10 mg daily -Refill hydrochlorothiazide 25 mg daily    Orders: -     hydroCHLOROthiazide; Take 1 tablet (25 mg total) by mouth daily.  Dispense: 90 tablet; Refill: 3 -     Atenolol; Take 1 tablet (50 mg total) by mouth every morning.  Dispense: 90 tablet; Refill: 3 -     Benazepril HCl; Take 1 tablet (10 mg total) by mouth daily.  Dispense: 90 tablet; Refill: 3  Anterior epistaxis Assessment & Plan: Recent nosebleeds lasting approximately 10 minutes, occurring over three consecutive days. No recent nosebleeds since. Possible contributing factors include dry air and use of Xarelto. No other symptoms reported. Hemodynamically stable.  -Advise to check blood pressure during future nosebleeds. -Continue use of humidifier at home. -If nosebleeds persist or worsen, consider evaluation in the emergency department.   Prediabetes Assessment &  Plan: Recent A1C at 6.1 -Monitor A1C and encourage dietary modifications to manage blood glucose levels.   Vitamin D deficiency Assessment & Plan: -Continue Vitamin D supplementation once weekly.     PDMP reviewed  Return in about 6 months (around 02/09/2024) for PCP, HTN.  Dana Allan, MD

## 2023-08-11 NOTE — Patient Instructions (Addendum)
It was a pleasure meeting you today. Thank you for allowing me to take part in your health care.  Our goals for today as we discussed include:  Start Zetia 10 mg daily for high cholesterol  Refill sent for Xarelto Will check to see if can switch medication in future  Blood pressure is great  Pneumonia 20 vaccine given today.  No further vaccine needed    This is a list of the screening recommended for you and due dates:  Health Maintenance  Topic Date Due   Zoster (Shingles) Vaccine (1 of 2) Never done   Pneumonia Vaccine (2 of 2 - PCV) 03/16/2020   Medicare Annual Wellness Visit  08/28/2021   DTaP/Tdap/Td vaccine (3 - Td or Tdap) 03/06/2030   Flu Shot  Completed   COVID-19 Vaccine  Completed   Hepatitis C Screening  Completed   HPV Vaccine  Aged Out   Colon Cancer Screening  Discontinued     Follow up in 6 months  If you have any questions or concerns, please do not hesitate to call the office at [phone].  I look forward to our next visit and until then take care and stay safe.  Regards,   Dana Allan, MD   [facility]

## 2023-08-12 ENCOUNTER — Encounter: Payer: Self-pay | Admitting: Family Medicine

## 2023-08-12 ENCOUNTER — Telehealth: Payer: Self-pay | Admitting: Family Medicine

## 2023-08-12 ENCOUNTER — Telehealth: Payer: Self-pay

## 2023-08-12 DIAGNOSIS — R7303 Prediabetes: Secondary | ICD-10-CM | POA: Insufficient documentation

## 2023-08-12 DIAGNOSIS — Z23 Encounter for immunization: Secondary | ICD-10-CM | POA: Insufficient documentation

## 2023-08-12 DIAGNOSIS — R040 Epistaxis: Secondary | ICD-10-CM | POA: Insufficient documentation

## 2023-08-12 MED ORDER — BENAZEPRIL HCL 10 MG PO TABS: 10.0000 mg | ORAL_TABLET | Freq: Every day | ORAL | 3 refills | Status: DC

## 2023-08-12 MED ORDER — ATENOLOL 50 MG PO TABS: 50.0000 mg | ORAL_TABLET | ORAL | 3 refills | Status: DC

## 2023-08-12 MED ORDER — APIXABAN 5 MG PO TABS: 5.0000 mg | ORAL_TABLET | Freq: Two times a day (BID) | ORAL | 11 refills | Status: DC

## 2023-08-12 MED ORDER — HYDROCHLOROTHIAZIDE 25 MG PO TABS: 25.0000 mg | ORAL_TABLET | Freq: Every day | ORAL | 3 refills | Status: DC

## 2023-08-12 NOTE — Assessment & Plan Note (Signed)
On Xarelto for anticoagulation. Recent episode sudden onset epistaxis that self resolved. -Discontinue Xarelto. -Start Eliquis 5 mg BID for decreased risk of bleeding

## 2023-08-12 NOTE — Assessment & Plan Note (Signed)
LDL cholesterol at 118, not currently on any cholesterol medications due to previous muscle aches. -Start Zetia to lower LDL cholesterol. -Encourage dietary modifications to lower cholesterol intake.

## 2023-08-12 NOTE — Assessment & Plan Note (Signed)
Long-standing history, managed with CCB, ACEi and diuretic.  Well controlled. Recent labs normal -Refill Atenolol 50 mg daily -Refill Benazepril 10 mg daily -Refill hydrochlorothiazide 25 mg daily

## 2023-08-12 NOTE — Telephone Encounter (Signed)
Left message to return call to our office.  I need to let him know about a change in medication.

## 2023-08-12 NOTE — Assessment & Plan Note (Signed)
Discontinue Xarelto Start Eliquis 5 mg BID

## 2023-08-12 NOTE — Assessment & Plan Note (Signed)
Recent nosebleeds lasting approximately 10 minutes, occurring over three consecutive days. No recent nosebleeds since. Possible contributing factors include dry air and use of Xarelto. No other symptoms reported. Hemodynamically stable.  -Advise to check blood pressure during future nosebleeds. -Continue use of humidifier at home. -If nosebleeds persist or worsen, consider evaluation in the emergency department.

## 2023-08-12 NOTE — Assessment & Plan Note (Signed)
-  Continue Vitamin D supplementation once weekly.

## 2023-08-12 NOTE — Assessment & Plan Note (Signed)
Recent A1C at 6.1 -Monitor A1C and encourage dietary modifications to manage blood glucose levels.

## 2023-08-12 NOTE — Telephone Encounter (Signed)
Pt's wife notified.

## 2024-02-09 ENCOUNTER — Encounter: Payer: Self-pay | Admitting: Family Medicine

## 2024-02-09 ENCOUNTER — Ambulatory Visit (INDEPENDENT_AMBULATORY_CARE_PROVIDER_SITE_OTHER): Payer: Medicare Other | Admitting: Family Medicine

## 2024-02-09 VITALS — BP 100/66 | HR 59 | Temp 98.5°F | Resp 20 | Ht 72.0 in | Wt 314.1 lb

## 2024-02-09 DIAGNOSIS — Z95828 Presence of other vascular implants and grafts: Secondary | ICD-10-CM

## 2024-02-09 DIAGNOSIS — E785 Hyperlipidemia, unspecified: Secondary | ICD-10-CM

## 2024-02-09 DIAGNOSIS — I10 Essential (primary) hypertension: Secondary | ICD-10-CM

## 2024-02-09 DIAGNOSIS — D6859 Other primary thrombophilia: Secondary | ICD-10-CM

## 2024-02-09 DIAGNOSIS — R7309 Other abnormal glucose: Secondary | ICD-10-CM | POA: Diagnosis not present

## 2024-02-09 DIAGNOSIS — I4819 Other persistent atrial fibrillation: Secondary | ICD-10-CM

## 2024-02-09 DIAGNOSIS — I4891 Unspecified atrial fibrillation: Secondary | ICD-10-CM

## 2024-02-09 DIAGNOSIS — I82511 Chronic embolism and thrombosis of right femoral vein: Secondary | ICD-10-CM

## 2024-02-09 MED ORDER — ATENOLOL 50 MG PO TABS
50.0000 mg | ORAL_TABLET | ORAL | 3 refills | Status: AC
Start: 2024-02-09 — End: ?

## 2024-02-09 MED ORDER — EZETIMIBE 10 MG PO TABS
10.0000 mg | ORAL_TABLET | Freq: Every day | ORAL | 3 refills | Status: AC
Start: 2024-02-09 — End: ?

## 2024-02-09 MED ORDER — APIXABAN 5 MG PO TABS: 5.0000 mg | ORAL_TABLET | Freq: Two times a day (BID) | ORAL | 11 refills | Status: DC

## 2024-02-09 MED ORDER — BENAZEPRIL HCL 10 MG PO TABS
10.0000 mg | ORAL_TABLET | Freq: Every day | ORAL | 3 refills | Status: AC
Start: 2024-02-09 — End: ?

## 2024-02-09 MED ORDER — HYDROCHLOROTHIAZIDE 12.5 MG PO TABS: 12.5000 mg | ORAL_TABLET | Freq: Every day | ORAL | Status: DC

## 2024-02-09 NOTE — Patient Instructions (Signed)
 It was a pleasure meeting you today. Thank you for allowing me to take part in your health care.  Our goals for today as we discussed include:  Decrease your hydrochlorothiazide  to 12.5 mg,  Half the tablet that you currently have.  Please let me know what your blood pressure is like the endo of this week and if ok will send in refill for the 12.5 mg  Continue all other blood pressure medications  Refills sent for requested medications  Schedule fasting lab appointment.  Fast for 10 hours.  Follow up in 6 months   This is a list of the screening recommended for you and due dates:  Health Maintenance  Topic Date Due   Medicare Annual Wellness Visit  08/28/2021   COVID-19 Vaccine (2 - 2024-25 season) 11/28/2023   Zoster (Shingles) Vaccine (1 of 2) 05/11/2024*   Flu Shot  03/26/2024   DTaP/Tdap/Td vaccine (3 - Td or Tdap) 03/06/2030   Pneumococcal Vaccine for age over 8  Completed   HPV Vaccine  Aged Out   Meningitis B Vaccine  Aged Out   Colon Cancer Screening  Discontinued   Hepatitis C Screening  Discontinued  *Topic was postponed. The date shown is not the original due date.      If you have any questions or concerns, please do not hesitate to call the office at [phone].  I look forward to our next visit and until then take care and stay safe.  Regards,   Valli Gaw, MD   [facility]

## 2024-02-12 ENCOUNTER — Encounter: Payer: Self-pay | Admitting: Family Medicine

## 2024-02-12 DIAGNOSIS — D6859 Other primary thrombophilia: Secondary | ICD-10-CM | POA: Insufficient documentation

## 2024-02-12 NOTE — Assessment & Plan Note (Signed)
 Previously on Xarelto .  Recently switched to Eliquis  BID for decrease risk of bleeding.  Tolerating well. - Refill Eliquis  5 mg BID  - Continue BB, would consider switching Atenolol  to Carvedilol

## 2024-02-12 NOTE — Assessment & Plan Note (Signed)
 On ezetimibe , intolerant to statins. Considering PCSK9 inhibitor if cholesterol high. Discussed biweekly injections, insurance, and device use. Hesitant but will consider post-lab results. - Refill Zetia  10 mg daily - Encourage dietary modifications to lower cholesterol intake. - Check fasting lipids - Consider PCSK9 inhibitor injections based on lab results and patient preference.

## 2024-02-12 NOTE — Assessment & Plan Note (Signed)
 History of IVC filter, A.Fib and currently on Eliquis  BID Tolerating well. No signs of bleeding

## 2024-02-12 NOTE — Assessment & Plan Note (Signed)
 Long-standing history, managed with CCB, ACEi and diuretic.  Well controlled. Recent labs normal - Refill Atenolol  50 mg daily - Refill Benazepril  10 mg daily - Decrease hydrochlorothiazide  from 25 to 12.5 mg daily due to dizziness.   - Monitor blood pressure at home.  Notify MD in 1 week if no improvement in symptoms

## 2024-02-12 NOTE — Assessment & Plan Note (Signed)
 BMI >44 with existing conditions. Severe arthritis, difficulty walking. Physical therapy previously ineffective. Encouraged movement for symptom management. - Encourage continued use of pedal exerciser for mobility. - Would benefti from GLP1 for weight loss - Encourage healthy lifestyle

## 2024-02-12 NOTE — Progress Notes (Signed)
 SUBJECTIVE:   Chief Complaint  Patient presents with   Medical Management of Chronic Issues    6 month follow up   HPI Presents for follow up chronic disease management  Discussed the use of AI scribe software for clinical note transcription with the patient, who gave verbal consent to proceed.  History of Present Illness Hector Bennett is an 78 year old male with hypertension who presents for a six-month follow-up. He is accompanied by a caregiver, who assists with his medical history.  He has been experiencing low blood pressure readings at home, though he is unsure of the exact values. He is currently on Benazepril , Hydrochlorothiazide , and Atenolol  for hypertension management. This morning, he felt dizzy and weak, but these symptoms improved after eating cereal and taking his blood pressure medication.  He is on Eliquis  twice daily and Zetia  at night for cholesterol management. He previously tried Lovastatin  but discontinued it due to difficulty walking. He has not experienced any bleeding while on Eliquis .  He has arthritis, which he describes as 'real bad,' and has previously tried physical therapy without success. He uses a pedal exerciser to help manage his symptoms and experiences swelling in his legs.  No chest pain or shortness of breath. He has a history of blood clots and previously took injections for them before being stabilized.    PERTINENT PMH / PSH: As above  OBJECTIVE:  BP 100/66   Pulse (!) 59   Temp 98.5 F (36.9 C)   Resp 20   Ht 6' (1.829 m)   Wt (!) 314 lb 2 oz (142.5 kg)   SpO2 97%   BMI 42.60 kg/m    Physical Exam Vitals reviewed.  Constitutional:      Appearance: He is obese.  HENT:     Head: Normocephalic.     Right Ear: Tympanic membrane, ear canal and external ear normal.     Left Ear: Tympanic membrane, ear canal and external ear normal.     Nose: Nose normal.     Mouth/Throat:     Mouth: Mucous membranes are moist.   Eyes:      Conjunctiva/sclera: Conjunctivae normal.     Pupils: Pupils are equal, round, and reactive to light.   Neck:     Thyroid : No thyromegaly or thyroid  tenderness.     Vascular: No carotid bruit.   Cardiovascular:     Rate and Rhythm: Normal rate and regular rhythm.     Pulses: Normal pulses.     Heart sounds: Normal heart sounds.  Pulmonary:     Effort: Pulmonary effort is normal.     Breath sounds: Normal breath sounds.  Abdominal:     General: Abdomen is flat. Bowel sounds are normal.     Palpations: Abdomen is soft.   Musculoskeletal:        General: Normal range of motion.     Cervical back: Normal range of motion and neck supple.     Right lower leg: No edema.     Left lower leg: No edema.  Lymphadenopathy:     Cervical: No cervical adenopathy.   Neurological:     Mental Status: He is alert.   Psychiatric:        Mood and Affect: Mood normal.        Behavior: Behavior normal.        Thought Content: Thought content normal.        Judgment: Judgment normal.  07/08/2023   10:37 AM 03/06/2020    8:12 AM 03/17/2019    3:28 PM 11/17/2017    1:42 PM 05/19/2017    3:25 PM  Depression screen PHQ 2/9  Decreased Interest 0 0 0 0 0  Down, Depressed, Hopeless 0 0 0 0 0  PHQ - 2 Score 0 0 0 0 0  Altered sleeping 0      Tired, decreased energy 0      Change in appetite 0      Feeling bad or failure about yourself  0      Trouble concentrating 0      Moving slowly or fidgety/restless 0      Suicidal thoughts 0      PHQ-9 Score 0      Difficult doing work/chores Not difficult at all          07/08/2023   10:37 AM  GAD 7 : Generalized Anxiety Score  Nervous, Anxious, on Edge 0  Control/stop worrying 0  Worry too much - different things 0  Trouble relaxing 0  Restless 0  Easily annoyed or irritable 0  Afraid - awful might happen 0  Total GAD 7 Score 0  Anxiety Difficulty Not difficult at all    ASSESSMENT/PLAN:  Primary hypertension Assessment &  Plan: Long-standing history, managed with CCB, ACEi and diuretic.  Well controlled. Recent labs normal - Refill Atenolol  50 mg daily - Refill Benazepril  10 mg daily - Decrease hydrochlorothiazide  from 25 to 12.5 mg daily due to dizziness.   - Monitor blood pressure at home.  Notify MD in 1 week if no improvement in symptoms    Orders: -     Comprehensive metabolic panel with GFR; Future -     Benazepril  HCl; Take 1 tablet (10 mg total) by mouth daily.  Dispense: 90 tablet; Refill: 3 -     hydroCHLOROthiazide ; Take 1 tablet (12.5 mg total) by mouth daily. -     Atenolol ; Take 1 tablet (50 mg total) by mouth every morning.  Dispense: 90 tablet; Refill: 3  Atrial fibrillation, unspecified type (HCC) Assessment & Plan: Previously on Xarelto .  Recently switched to Eliquis  BID for decrease risk of bleeding.  Tolerating well. - Refill Eliquis  5 mg BID  - Continue BB, would consider switching Atenolol  to Carvedilol   Orders: -     CBC with Differential/Platelet; Future  Abnormal glucose -     Hemoglobin A1c; Future  Morbid obesity (HCC) Assessment & Plan: BMI >44 with existing conditions. Severe arthritis, difficulty walking. Physical therapy previously ineffective. Encouraged movement for symptom management. - Encourage continued use of pedal exerciser for mobility. - Would benefti from GLP1 for weight loss - Encourage healthy lifestyle   Hyperlipidemia, unspecified hyperlipidemia type Assessment & Plan: On ezetimibe , intolerant to statins. Considering PCSK9 inhibitor if cholesterol high. Discussed biweekly injections, insurance, and device use. Hesitant but will consider post-lab results. - Refill Zetia  10 mg daily - Encourage dietary modifications to lower cholesterol intake. - Check fasting lipids - Consider PCSK9 inhibitor injections based on lab results and patient preference.  Orders: -     Lipid panel; Future -     Ezetimibe ; Take 1 tablet (10 mg total) by mouth daily.   Dispense: 90 tablet; Refill: 3  Persistent atrial fibrillation (HCC) Assessment & Plan: Previously on Xarelto .  Recently switched to Eliquis  BID for decrease risk of bleeding.  Tolerating well. - Refill Eliquis  5 mg BID  - Continue BB, would consider switching  Atenolol  to Carvedilol   Orders: -     Apixaban ; Take 1 tablet (5 mg total) by mouth 2 (two) times daily.  Dispense: 60 tablet; Refill: 11  Hypercoagulable state (HCC) Assessment & Plan: History of IVC filter, A.Fib and currently on Eliquis  BID Tolerating well. No signs of bleeding    PDMP reviewed  Return in about 6 months (around 08/10/2024) for PCP, HTN.  Valli Gaw, MD

## 2024-02-17 ENCOUNTER — Other Ambulatory Visit (INDEPENDENT_AMBULATORY_CARE_PROVIDER_SITE_OTHER)

## 2024-02-17 DIAGNOSIS — E785 Hyperlipidemia, unspecified: Secondary | ICD-10-CM

## 2024-02-17 DIAGNOSIS — R7309 Other abnormal glucose: Secondary | ICD-10-CM

## 2024-02-17 DIAGNOSIS — I4891 Unspecified atrial fibrillation: Secondary | ICD-10-CM

## 2024-02-17 DIAGNOSIS — I10 Essential (primary) hypertension: Secondary | ICD-10-CM

## 2024-02-18 ENCOUNTER — Ambulatory Visit: Payer: Self-pay | Admitting: Family Medicine

## 2024-02-18 LAB — LIPID PANEL
Chol/HDL Ratio: 3.6 ratio (ref 0.0–5.0)
Cholesterol, Total: 160 mg/dL (ref 100–199)
HDL: 45 mg/dL (ref >39)
LDL Chol Calc (NIH): 96 mg/dL (ref 0–99)
Triglycerides: 104 mg/dL (ref 0–149)
VLDL Cholesterol Cal: 19 mg/dL (ref 5–40)

## 2024-02-18 LAB — CBC WITH DIFFERENTIAL/PLATELET
Basophils Absolute: 0 10*3/uL (ref 0.0–0.2)
Basos: 1 %
EOS (ABSOLUTE): 0.3 10*3/uL (ref 0.0–0.4)
Eos: 3 %
Hematocrit: 48.9 % (ref 37.5–51.0)
Hemoglobin: 15.6 g/dL (ref 13.0–17.7)
Immature Grans (Abs): 0 10*3/uL (ref 0.0–0.1)
Immature Granulocytes: 0 %
Lymphocytes Absolute: 1.2 10*3/uL (ref 0.7–3.1)
Lymphs: 14 %
MCH: 30.9 pg (ref 26.6–33.0)
MCHC: 31.9 g/dL (ref 31.5–35.7)
MCV: 97 fL (ref 79–97)
Monocytes Absolute: 0.6 10*3/uL (ref 0.1–0.9)
Monocytes: 6 %
Neutrophils Absolute: 6.5 10*3/uL (ref 1.4–7.0)
Neutrophils: 76 %
Platelets: 221 10*3/uL (ref 150–450)
RBC: 5.05 x10E6/uL (ref 4.14–5.80)
RDW: 13.5 % (ref 11.6–15.4)
WBC: 8.6 10*3/uL (ref 3.4–10.8)

## 2024-02-18 LAB — COMPREHENSIVE METABOLIC PANEL WITH GFR
ALT: 13 IU/L (ref 0–44)
AST: 18 IU/L (ref 0–40)
Albumin: 4.3 g/dL (ref 3.8–4.8)
Alkaline Phosphatase: 84 IU/L (ref 44–121)
BUN/Creatinine Ratio: 17 (ref 10–24)
BUN: 17 mg/dL (ref 8–27)
Bilirubin Total: 0.7 mg/dL (ref 0.0–1.2)
CO2: 22 mmol/L (ref 20–29)
Calcium: 9.7 mg/dL (ref 8.6–10.2)
Chloride: 101 mmol/L (ref 96–106)
Creatinine, Ser: 1.01 mg/dL (ref 0.76–1.27)
Globulin, Total: 2.5 g/dL (ref 1.5–4.5)
Glucose: 116 mg/dL — ABNORMAL HIGH (ref 70–99)
Potassium: 5 mmol/L (ref 3.5–5.2)
Sodium: 140 mmol/L (ref 134–144)
Total Protein: 6.8 g/dL (ref 6.0–8.5)
eGFR: 75 mL/min/{1.73_m2} (ref >59)

## 2024-02-18 LAB — HEMOGLOBIN A1C
Est. average glucose Bld gHb Est-mCnc: 128 mg/dL
Hgb A1c MFr Bld: 6.1 % — ABNORMAL HIGH (ref 4.8–5.6)

## 2024-03-29 NOTE — Progress Notes (Signed)
 [facility] - Clinic Note  04/12/2024   CHIEF COMPLAINT Patient presents for Retina Follow Up  HISTORY OF PRESENT ILLNESS: Hector Bennett is a 78 y.o. male who presents to the clinic today for:  HPI     Retina Follow Up   In both eyes.  This started 9 months ago.  Duration of 9 months.  Since onset it is gradually worsening.  I, the attending physician,  performed the HPI with the patient and updated documentation appropriately.        Comments   9 month retina follow up ret atrophy pt saw Dr Vivian and is having a yag OS Tuesday they started him on miebo and he is taking Simbrinza bid OU and using timolol        Last edited by Valdemar Rogue, MD on 04/18/2024  3:06 AM.    Patient states  he's scheduled for a YAG laser procedure at [facility] w/ Dr. Fleeta on [facility] for PCO.  Referring physician: [place], [place], OD [address] [place],  [place] [address]  HISTORICAL INFORMATION:  Selected notes from the MEDICAL RECORD NUMBER Referred by Dr. Vivian for retina eval -- concern for low vision / retinal ischemia OD LEE:  Ocular Hx- PMH-   CURRENT MEDICATIONS: Current Outpatient Medications (Ophthalmic Drugs)  Medication Sig   SIMBRINZA 1-0.2 % SUSP Apply 1 drop to eye 2 (two) times daily.   timolol  (TIMOPTIC ) 0.5 % ophthalmic solution Place 1 drop into both eyes daily.   No current facility-administered medications for this visit. (Ophthalmic Drugs)   Current Outpatient Medications (Other)  Medication Sig   acetaminophen  (TYLENOL ) 650 MG CR tablet Take 650 mg by mouth every 8 (eight) hours as needed.   apixaban  (ELIQUIS ) 5 MG TABS tablet Take 1 tablet (5 mg total) by mouth 2 (two) times daily.   atenolol  (TENORMIN ) 50 MG tablet Take 1 tablet (50 mg total) by mouth every morning.   benazepril  (LOTENSIN ) 10 MG tablet Take 1 tablet (10 mg total) by mouth daily.   ezetimibe  (ZETIA ) 10 MG tablet Take 1 tablet (10 mg total) by mouth daily.    hydrochlorothiazide  (HYDRODIURIL ) 12.5 MG tablet Take 1 tablet (12.5 mg total) by mouth daily.   Multiple Vitamins-Minerals (CENTRUM SILVER PO) Take by mouth daily.   Vitamin D , Ergocalciferol , (DRISDOL ) 1.25 MG (50000 UNIT) CAPS capsule Take 1 capsule (50,000 Units total) by mouth every 7 (seven) days.   No current facility-administered medications for this visit. (Other)   REVIEW OF SYSTEMS: ROS   Positive for: Musculoskeletal, Cardiovascular, Eyes Last edited by Resa Delon ORN, COT on 04/12/2024  8:40 AM.     ALLERGIES No Known Allergies PAST MEDICAL HISTORY Past Medical History:  Diagnosis Date   A-fib (HCC)    Arthritis    DVT (deep venous thrombosis) (HCC) 2007, 2009   Dysrhythmia    af   Hearing loss    Hyperlipidemia    Hypertension    Personal history of thrombophlebitis    Pulmonary embolism (HCC) 11/26/2007   Bilateral   Past Surgical History:  Procedure Laterality Date   CATARACT EXTRACTION W/PHACO Right 09/09/2019   Procedure: CATARACT EXTRACTION PHACO AND INTRAOCULAR LENS PLACEMENT (IOC) RIGHT;  Surgeon: Ferol Rogue, MD;  Location: [facility] ORS;  Service: Ophthalmology;  Laterality: Right;  US  01:07.3 CDE 8.71 Fluid Pack Lot # [id number]   CATARACT EXTRACTION W/PHACO Left 03/19/2021   Procedure: CATARACT EXTRACTION PHACO AND INTRAOCULAR LENS PLACEMENT (IOC) LEFT;  Surgeon: Jaye Fallow, MD;  [facility];  Service: Ophthalmology;  Laterality: Left;  12.21 1:05.4   COLONOSCOPY WITH PROPOFOL  N/A 10/20/2017   Procedure: COLONOSCOPY WITH PROPOFOL ;  Surgeon: Therisa Bi, MD;  Location: [facility] ENDOSCOPY;  Service: Gastroenterology;  Laterality: N/A;   COLONOSCOPY WITH PROPOFOL  N/A 03/02/2018   Procedure: COLONOSCOPY WITH PROPOFOL ;  Surgeon: Therisa Bi, MD;  Location: [facility] ENDOSCOPY;  Service: Gastroenterology;  Laterality: N/A;   HAND SURGERY Left 11/09/2014   IVC FILTER INSERTION Right 10/05/2018   Procedure: IVC FILTER INSERTION;  Surgeon: Marea Selinda RAMAN, MD;  Location: [facility] INVASIVE CV LAB;  Service: Cardiovascular;  Laterality: Right;   IVC FILTER REMOVAL N/A 04/26/2019   Procedure: IVC FILTER REMOVAL;  Surgeon: Marea Selinda RAMAN, MD;  Location: [facility] INVAS[facility] CV LAB;  Service: Cardiovascular;  Laterality: N/A;   JOINT REPLACEMENT Bilateral    knee replacements   KNEE ARTHROPLASTY Left 07/31/2015   Procedure: COMPUTER ASSISTED TOTAL KNEE ARTHROPLASTY;  Surgeon: Lynwood SHAUNNA Hue, MD;  Location: [facility] ORS;  Service: Orthopedics;  Laterality: Left;   KNEE ARTHROPLASTY Right 10/12/2018   Procedure: COMPUTER ASSISTED TOTAL KNEE ARTHROPLASTY-RIGHT;  Surgeon: Hue Lynwood SHAUNNA, MD;  Location: [facility] ORS;  Service: Orthopedics;  Laterality: Right;   KNEE SURGERY  1975   PERIPHERAL VASCULAR CATHETERIZATION N/A 07/25/2015   Procedure: IVC Filter Insertion;  Surgeon: Cordella KANDICE Shawl, MD;  Location: [facility];  Service: Cardiovascular;  Laterality: N/A;   PERIPHERAL VASCULAR CATHETERIZATION N/A 08/29/2015   Procedure: IVC Filter Removal;  Surgeon: Cordella KANDICE Shawl, MD;  Location: [facility];  Service: Cardiovascular;  Laterality: N/A;   FAMILY HISTORY Family History  Problem Relation Age of Onset   Hypertension Mother    Hypertension Brother    Diabetes Brother    Dementia Brother    SOCIAL HISTORY Social History   Tobacco Use   Smoking status: Former    Types: Cigarettes   Smokeless tobacco: Never   Tobacco comments:    quit 40 years ago   Vaping Use   Vaping status: Never Used  Substance Use Topics   Alcohol use: Yes    Comment: occasional   Drug use: No       OPHTHALMIC EXAM:  Base Eye Exam     Visual Acuity (Snellen - Linear)       Right Left   Dist cc HM 20/40   Dist ph cc  20/30 -1         Tonometry (Tonopen, 8:45 AM)       Right Left   Pressure 15 13         Pupils       Pupils Dark Light Shape React APD   Right PERRL 4 4 Round NR    Left PERRL 3 2 Round Minimal None         Visual  Fields       Left Right   Restrictions Total superior temporal, inferior temporal, superior nasal, inferior nasal deficiencies Partial outer superior temporal, inferior temporal, superior nasal, inferior nasal deficiencies         Neuro/Psych     Oriented x3: Yes   Mood/Affect: Normal         Dilation     Both eyes: 2.5% Phenylephrine  @ 8:45 AM           Slit Lamp and Fundus Exam     External Exam       Right Left   External  Normal Normal         Slit Lamp Exam       Right Left   Lids/Lashes Dermatochalasis - upper lid, Meibomian gland dysfunction Dermatochalasis - upper lid, Meibomian gland dysfunction   Conjunctiva/Sclera White and quiet White and quiet   Cornea Arcus, Well healed temporal cataract wound Arcus, Well healed temporal cataract wound, 1+ Punctate epithelial erosions   Anterior Chamber Deep and clear Deep and clear   Iris scattered TID 's at 2 and 8 oclock Round and moderately dilated, no NVI   Lens PC IOL in good postition, 1+ Posterior capsular opacification PC IOL in good postition, 1-2+ Posterior capsular opacification   Anterior Vitreous Vitreous syneresis, + fine cell/pigment, Vitreous condensations, Posterior vitreous detachment Vitreous syneresis, Vitreous condensations, Posterior vitreous detachment         Fundus Exam       Right Left   Disc 4+ pallor, sharp rim, attenuated vessels 2+ pallor, sharp rim   C/D Ratio 0.75 0.5   Macula Flat, Blunted foveal reflex, diffuse atrophy, flat nevus inferior to fovea 1DD, no heme/SRF/or orange pigment, RPE mottling, ERM, no heme or edema Flat, Blunted foveal reflex, mild ERM, diffuse atrophy, no heme/edmea   Vessels Severe Vascular attenuation Severe Vascular attenuation, Tortuous   Periphery Attached, diffused atrophy, no heme Attached, No heme, diffused atrophy           Refraction     Wearing Rx       Sphere Cylinder Axis Add   Right +0.25 +1.00 174 +2.50   Left -0.50 +1.50 003 +2.50            IMAGING AND PROCEDURES  Imaging and Procedures for 04/12/2024  OCT, Retina - OU - Both Eyes       Right Eye Quality was good. Central Foveal Thickness: 285. Progression has worsened. Findings include no SRF, abnormal foveal contour, retinal drusen , outer retinal tubulation, epiretinal membrane, inner retinal atrophy (Diffuse retinal atrophy--mild progression of ellipsoid loss, mild ERM with trace cystic changes nasal mac).   Left Eye Quality was good. Central Foveal Thickness: 315. Progression has been stable. Findings include normal foveal contour, no IRF, no SRF, retinal drusen , epiretinal membrane, outer retinal atrophy (Diffuse outer retinal atrophy w/ sparing of central macula, mild ERM and partial PVD).   Notes *Images captured and stored on drive  Diagnosis / Impression:  OD: Diffuse retinal atrophy--mild progression of ellipsoid loss, mild ERM with trace cystic changes nasal mac OS: Diffuse outer retinal atrophy w/ sparing of central macula, mild ERM and partial PVD  Clinical management:  See below  Abbreviations: NFP - Normal foveal profile. CME - cystoid macular edema. PED - pigment epithelial detachment. IRF - intraretinal fluid. SRF - subretinal fluid. EZ - ellipsoid zone. ERM - epiretinal membrane. ORA - outer retinal atrophy. ORT - outer retinal tubulation. SRHM - subretinal hyper-reflective material. IRHM - intraretinal hyper-reflective material           ASSESSMENT/PLAN:   ICD-10-CM   1. Retinal macular atrophy  H35.89 OCT, Retina - OU - Both Eyes    2. Retinal ischemia  H35.82     3. Ischemic optic neuropathy of right eye  H47.011     4. Choroidal nevus of right eye  D31.31     5. Epiretinal membrane (ERM) of both eyes  H35.373     6. Essential hypertension  I10     7. Hypertensive retinopathy of both eyes  H35.033  8. Pseudophakia of both eyes  Z96.1     9. Bilateral ocular hypertension  H40.053      1-3. Retinal atrophy OU  secondary to retinal ischemia  - pt reports progressive vision loss OD starting around 2021 - pt w/ significant vascular history of DVT and PE -- patient is on Eliquis  - BCVA OD HM, OS 20/25 - OCT shows OD: Diffuse retinal atrophy--mild progression of ellipsoid loss, mild ERM with trace cystic changes nasal mac; OS: Diffuse outer retinal atrophy sparing of central macula - FA 07.23.24 shows OD: Delayed choroidal filling at 1 minute, arteriolar filling at 2 minutes, Diffuse staining, severely attenuated fluorescein  signal within vessels, late perifoveal leakage, no NV; OS: Mild peripheral vascular nonperfusion, no NV or leakage - exam shows severely attenuated vessels and diffuse retinal atrophy OU; OD with 3-4+ disc pallor and +rAPD--ischemic optic neuropathy - discussed findings, prognosis and permanent nature of vision loss secondary to ischemia - no retinal or ophthalmic interventions indicated or recommended  - recommend optimization of cardiovascular risk factors - f/u 6-9 months DFE, OCT  4. Choroidal Nevus, OD  - flat pigmented lesion ~1DD in size, just inferior to fovea  - no visual symptoms, no drusen, SRF or orange pigment  - thickness < 2mm  - discussed findings, prognosis  - recommend monitoring   5. Epiretinal membrane, OU - mild ERM OU - BCVA OS 20/25 - no indication for surgery at this time - monitor for now - f/u 4 mos -- DFE/OCT   6,7. Hypertensive retinopathy OU - discussed importance of tight BP control - monitor   8. Pseudophakia OU  - s/p CE/IOL OU  - IOL in good position, doing well  - PCO OU, scheduled for YAG OS w/ Dr. Fleeta  - monitor   9. Ocular hypertension OU  - currently on simbrinza BID OU and timolol  every day OU  - IOP 15,13  Ophthalmic Meds Ordered this visit:  No orders of the defined types were placed in this encounter.    Return in about 1 year (around 04/12/2025) for retinal atrophy OU, DFE, OCT.  There are no Patient Instructions on  file for this visit.  Explained the diagnoses, plan, and follow up with the patient and they expressed understanding.  Patient expressed understanding of the importance of proper follow up care.   This document serves as a record of services personally performed by Redell JUDITHANN Hans, MD, PhD. It was created on their behalf by Avelina Pereyra, COA an ophthalmic technician. The creation of this record is the provider's dictation and/or activities during the visit.   Electronically signed by: Avelina GORMAN Pereyra, COT  04/18/24  3:07 AM   Redell JUDITHANN Hans, M.D., Ph.D. Diseases & Surgery of the Retina and Vitreous [facility] 04/12/2024  I have reviewed the above documentation for accuracy and completeness, and I agree with the above. Redell JUDITHANN Hans, M.D., Ph.D. 04/18/24 3:09 AM   Abbreviations: M myopia (nearsighted); A astigmatism; H hyperopia (farsighted); P presbyopia; Mrx spectacle prescription;  CTL contact lenses; OD right eye; OS left eye; OU both eyes  XT exotropia; ET esotropia; PEK punctate epithelial keratitis; PEE punctate epithelial erosions; DES dry eye syndrome; MGD meibomian gland dysfunction; ATs artificial tears; PFAT's preservative free artificial tears; NSC nuclear sclerotic cataract; PSC posterior subcapsular cataract; ERM epi-retinal membrane; PVD posterior vitreous detachment; RD retinal detachment; DM diabetes mellitus; DR diabetic retinopathy; NPDR non-proliferative diabetic retinopathy; PDR proliferative diabetic retinopathy; CSME clinically significant  macular edema; DME diabetic macular edema; dbh dot blot hemorrhages; CWS cotton wool spot; POAG primary open angle glaucoma; C/D cup-to-disc ratio; HVF humphrey visual field; GVF goldmann visual field; OCT optical coherence tomography; IOP intraocular pressure; BRVO Branch retinal vein occlusion; CRVO central retinal vein occlusion; CRAO central retinal artery occlusion; BRAO branch retinal artery occlusion; RT  retinal tear; SB scleral buckle; PPV pars plana vitrectomy; VH Vitreous hemorrhage; PRP panretinal laser photocoagulation; IVK intravitreal kenalog; VMT vitreomacular traction; MH Macular hole;  NVD neovascularization of the disc; NVE neovascularization elsewhere; AREDS age related eye disease study; ARMD age related macular degeneration; POAG primary open angle glaucoma; EBMD epithelial/anterior basement membrane dystrophy; ACIOL anterior chamber intraocular lens; IOL intraocular lens; PCIOL posterior chamber intraocular lens; Phaco/IOL phacoemulsification with intraocular lens placement; PRK photorefractive keratectomy; LASIK laser assisted in situ keratomileusis; HTN hypertension; DM diabetes mellitus; COPD chronic obstructive pulmonary disease

## 2024-04-12 ENCOUNTER — Ambulatory Visit (INDEPENDENT_AMBULATORY_CARE_PROVIDER_SITE_OTHER): Payer: Medicare Other | Admitting: Ophthalmology

## 2024-04-12 ENCOUNTER — Encounter (INDEPENDENT_AMBULATORY_CARE_PROVIDER_SITE_OTHER): Payer: Self-pay | Admitting: Ophthalmology

## 2024-04-12 DIAGNOSIS — I10 Essential (primary) hypertension: Secondary | ICD-10-CM

## 2024-04-12 DIAGNOSIS — H47011 Ischemic optic neuropathy, right eye: Secondary | ICD-10-CM | POA: Diagnosis not present

## 2024-04-12 DIAGNOSIS — H35033 Hypertensive retinopathy, bilateral: Secondary | ICD-10-CM

## 2024-04-12 DIAGNOSIS — H35373 Puckering of macula, bilateral: Secondary | ICD-10-CM

## 2024-04-12 DIAGNOSIS — Z961 Presence of intraocular lens: Secondary | ICD-10-CM

## 2024-04-12 DIAGNOSIS — H3582 Retinal ischemia: Secondary | ICD-10-CM | POA: Diagnosis not present

## 2024-04-12 DIAGNOSIS — H40053 Ocular hypertension, bilateral: Secondary | ICD-10-CM

## 2024-04-12 DIAGNOSIS — D3131 Benign neoplasm of right choroid: Secondary | ICD-10-CM

## 2024-04-12 DIAGNOSIS — H3589 Other specified retinal disorders: Secondary | ICD-10-CM | POA: Diagnosis not present

## 2024-04-18 ENCOUNTER — Encounter (INDEPENDENT_AMBULATORY_CARE_PROVIDER_SITE_OTHER): Payer: Self-pay | Admitting: Ophthalmology

## 2024-05-17 ENCOUNTER — Ambulatory Visit (INDEPENDENT_AMBULATORY_CARE_PROVIDER_SITE_OTHER): Admitting: *Deleted

## 2024-05-17 VITALS — BP 129/65 | Ht 72.0 in | Wt 300.0 lb

## 2024-05-17 DIAGNOSIS — Z0000 Encounter for general adult medical examination without abnormal findings: Secondary | ICD-10-CM | POA: Diagnosis not present

## 2024-05-17 NOTE — Patient Instructions (Signed)
 [name],  Thank you for taking the time for your Medicare Wellness Visit. I appreciate your continued commitment to your health goals. Please review the care plan we discussed, and feel free to reach out if I can assist you further.  Medicare recommends these wellness visits once per year to help you and your care team stay ahead of potential health issues. These visits are designed to focus on prevention, allowing your provider to concentrate on managing your acute and chronic conditions during your regular appointments.  Please note that Annual Wellness Visits do not include a physical exam. Some assessments may be limited, especially if the visit was conducted virtually. If needed, we may recommend a separate in-person follow-up with your provider.  Ongoing Care Seeing your primary care provider every 3 to 6 months helps us  monitor your health and provide consistent, personalized care.  Remember to get your flu vaccine  Referrals If a referral was made during today's visit and you haven't received any updates within two weeks, please contact the referred provider directly to check on the status.  Recommended Screenings:  Health Maintenance  Topic Date Due   Zoster (Shingles) Vaccine (1 of 2) Never done   Flu Shot  03/26/2024   COVID-19 Vaccine (2 - 2024-25 season) 04/26/2024   Medicare Annual Wellness Visit  05/17/2025   DTaP/Tdap/Td vaccine (3 - Td or Tdap) 03/06/2030   Pneumococcal Vaccine for age over 23  Completed   HPV Vaccine  Aged Out   Meningitis B Vaccine  Aged Out   Colon Cancer Screening  Discontinued   Hepatitis C Screening  Discontinued       05/17/2024    2:39 PM  Advanced Directives  Does Patient Have a Medical Advance Directive? No  Would patient like information on creating a medical advance directive? No - Patient declined   Advance Care Planning is important because it: Ensures you receive medical care that aligns with your values, goals, and  preferences. Provides guidance to your family and loved ones, reducing the emotional burden of decision-making during critical moments.  Vision: Annual vision screenings are recommended for early detection of glaucoma, cataracts, and diabetic retinopathy. These exams can also reveal signs of chronic conditions such as diabetes and high blood pressure.  Dental: Annual dental screenings help detect early signs of oral cancer, gum disease, and other conditions linked to overall health, including heart disease and diabetes.  Please see the attached documents for additional preventive care recommendations.

## 2024-05-17 NOTE — Progress Notes (Signed)
 Subjective:   Hector Bennett is a 78 y.o. who presents for a Medicare Wellness preventive visit.  As a reminder, Annual Wellness Visits don't include a physical exam, and some assessments may be limited, especially if this visit is performed virtually. We may recommend an in-person follow-up visit with your provider if needed.  Visit Complete: Virtual I connected with  Hector Bennett on 05/17/24 by a audio enabled telemedicine application and verified that I am speaking with the correct person using two identifiers.  Patient Location: Home  Provider Location: Home Office  I discussed the limitations of evaluation and management by telemedicine. The patient expressed understanding and agreed to proceed.  Vital Signs: Because this visit was a virtual/telehealth visit, some criteria may be missing or patient reported. Any vitals not documented were not able to be obtained and vitals that have been documented are patient reported.  VideoDeclined- This patient declined Librarian, academic. Therefore the visit was completed with audio only.  Persons Participating in Visit: Patient. Assisted by wife Asberry STAIRS Questionnaire: No: Patient Medicare AWV questionnaire was not completed prior to this visit.  Cardiac Risk Factors include: advanced age (>51men, >43 women);male gender;obesity (BMI >30kg/m2);hypertension;dyslipidemia;Other (see comment)     Objective:    Today's Vitals   05/17/24 1422  BP: 129/65  Weight: 300 lb (136.1 kg)  Height: 6' (1.829 m)   Body mass index is 40.69 kg/m.     05/17/2024    2:39 PM 04/26/2019    8:06 AM 10/12/2018   10:23 AM 10/05/2018    7:13 AM 09/29/2018    1:44 PM 03/02/2018    7:37 AM 11/17/2017    1:43 PM  Advanced Directives  Does Patient Have a Medical Advance Directive? No No No  No  No  No  Yes   Type of Tax inspector;Living will  Copy of Healthcare Power of Attorney in  Chart?       No - copy requested   Would patient like information on creating a medical advance directive? No - Patient declined No - Patient declined No - Patient declined  No - Patient declined  No - Patient declined  No - Patient declined       Data saved with a previous flowsheet row definition    Current Medications (verified) Outpatient Encounter Medications as of 05/17/2024  Medication Sig   acetaminophen  (TYLENOL ) 650 MG CR tablet Take 650 mg by mouth every 8 (eight) hours as needed.   apixaban  (ELIQUIS ) 5 MG TABS tablet Take 1 tablet (5 mg total) by mouth 2 (two) times daily.   atenolol  (TENORMIN ) 50 MG tablet Take 1 tablet (50 mg total) by mouth every morning.   benazepril  (LOTENSIN ) 10 MG tablet Take 1 tablet (10 mg total) by mouth daily.   ezetimibe  (ZETIA ) 10 MG tablet Take 1 tablet (10 mg total) by mouth daily.   hydrochlorothiazide  (HYDRODIURIL ) 12.5 MG tablet Take 1 tablet (12.5 mg total) by mouth daily.   Multiple Vitamins-Minerals (CENTRUM SILVER PO) Take by mouth daily.   Perfluorohexyloctane (MIEBO OP) Apply to eye 4 (four) times daily.   SIMBRINZA 1-0.2 % SUSP Apply 1 drop to eye 2 (two) times daily.   timolol  (TIMOPTIC ) 0.5 % ophthalmic solution Place 1 drop into both eyes daily.   Vitamin D , Ergocalciferol , (DRISDOL ) 1.25 MG (50000 UNIT) CAPS capsule Take 1 capsule (50,000 Units total) by mouth every 7 (seven) days.  No facility-administered encounter medications on file as of 05/17/2024.    Allergies (verified) Patient has no known allergies.   History: Past Medical History:  Diagnosis Date   A-fib (HCC)    Arthritis    DVT (deep venous thrombosis) (HCC) 2007, 2009   Dysrhythmia    af   Hearing loss    Hyperlipidemia    Hypertension    Personal history of thrombophlebitis    Pulmonary embolism (HCC) 11/26/2007   Bilateral   Past Surgical History:  Procedure Laterality Date   CATARACT EXTRACTION W/PHACO Right 09/09/2019   Procedure: CATARACT  EXTRACTION PHACO AND INTRAOCULAR LENS PLACEMENT (IOC) RIGHT;  Surgeon: Ferol Rogue, MD;  Location: [facility];  Service: Ophthalmology;  Laterality: Right;  US  01:07.3 CDE 8.71 Fluid Pack Lot # [id number]   CATARACT EXTRACTION W/PHACO Left 03/19/2021   Procedure: CATARACT EXTRACTION PHACO AND INTRAOCULAR LENS PLACEMENT (IOC) LEFT;  Surgeon: Jaye Fallow, MD;  Location: [facility];  Service: Ophthalmology;  Laterality: Left;  12.21 1:05.4   COLONOSCOPY WITH PROPOFOL  N/A 10/20/2017   Procedure: COLONOSCOPY WITH PROPOFOL ;  Surgeon: Therisa Bi, MD;  Location: [facility];  Service: Gastroenterology;  Laterality: N/A;   COLONOSCOPY WITH PROPOFOL  N/A 03/02/2018   Procedure: COLONOSCOPY WITH PROPOFOL ;  Surgeon: Therisa Bi, MD;  Location: [facility] ENDOSCOPY;  Service: Gastroenterology;  Laterality: N/A;   HAND SURGERY Left 11/09/2014   IVC FILTER INSERTION Right 10/05/2018   Procedure: IVC FILTER INSERTION;  Surgeon: Marea Selinda RAMAN, MD;  Location: [facility];  Service: Cardiovascular;  Laterality: Right;   IVC FILTER REMOVAL N/A 04/26/2019   Procedure: IVC FILTER REMOVAL;  Surgeon: Marea Selinda RAMAN, MD;  Location: [facility];  Service: Cardiovascular;  Laterality: N/A;   JOINT REPLACEMENT Bilateral    knee replacements   KNEE ARTHROPLASTY Left 07/31/2015   Procedure: COMPUTER ASSISTED TOTAL KNEE ARTHROPLASTY;  Surgeon: Lynwood SHAUNNA Hue, MD;  Location: [facility];  Service: Orthopedics;  Laterality: Left;   KNEE ARTHROPLASTY Right 10/12/2018   Procedure: COMPUTER ASSISTED TOTAL KNEE ARTHROPLASTY-RIGHT;  Surgeon: Hue Lynwood SHAUNNA, MD;  Location: [facility];  Service: Orthopedics;  Laterality: Right;   KNEE SURGERY  1975   PERIPHERAL VASCULAR CATHETERIZATION N/A 07/25/2015   Procedure: IVC Filter Insertion;  Surgeon: Cordella KANDICE Shawl, MD;  Location: [facility];  Service: Cardiovascular;  Laterality: N/A;   PERIPHERAL VASCULAR CATHETERIZATION N/A 08/29/2015   Procedure: IVC Filter  Removal;  Surgeon: Cordella KANDICE Shawl, MD;  Location: [facility];  Service: Cardiovascular;  Laterality: N/A;   Family History  Problem Relation Age of Onset   Hypertension Mother    Hypertension Brother    Diabetes Brother    Dementia Brother    Social History   Socioeconomic History   Marital status: Married    Spouse name: Not on file   Number of children: Not on file   Years of education: Not on file   Highest education level: High school graduate  Occupational History   Occupation: retired  Tobacco Use   Smoking status: Former    Types: Cigarettes   Smokeless tobacco: Never   Tobacco comments:    quit 40 years ago   Vaping Use   Vaping status: Never Used  Substance and Sexual Activity   Alcohol use: Yes    Comment: occasional   Drug use: No   Sexual activity: Not on file  Other Topics Concern   Not on file  Social History Narrative  married   Social Drivers of Corporate investment banker Strain: Low Risk  (05/17/2024)   Overall Financial Resource Strain (CARDIA)    Difficulty of Paying Living Expenses: Not hard at all  Food Insecurity: No Food Insecurity (05/17/2024)   Hunger Vital Sign    Worried About Running Out of Food in the Last Year: Never true    Ran Out of Food in the Last Year: Never true  Transportation Needs: No Transportation Needs (05/17/2024)   PRAPARE - Administrator, Civil Service (Medical): No    Lack of Transportation (Non-Medical): No  Physical Activity: Inactive (05/17/2024)   Exercise Vital Sign    Days of Exercise per Week: 0 days    Minutes of Exercise per Session: 0 min  Stress: No Stress Concern Present (05/17/2024)   [employer] of Occupational Health - Occupational Stress Questionnaire    Feeling of Stress: Not at all  Social Connections: Moderately Isolated (05/17/2024)   Social Connection and Isolation Panel    Frequency of Communication with Friends and Family: More than three times a week     Frequency of Social Gatherings with Friends and Family: Twice a week    Attends Religious Services: Never    Database administrator or Organizations: No    Attends Engineer, structural: Never    Marital Status: Married    Tobacco Counseling Counseling given: Not Answered Tobacco comments: quit 40 years ago     Clinical Intake:  Pre-visit preparation completed: Yes  Pain : No/denies pain     BMI - recorded: 40.69 Nutritional Status: BMI > 30  Obese Nutritional Risks: None Diabetes: No  Lab Results  Component Value Date   HGBA1C 6.1 (H) 02/17/2024   HGBA1C 6.1 07/21/2023     How often do you need to have someone help you when you read instructions, pamphlets, or other written materials from your doctor or pharmacy?: 1 - Never  Interpreter Needed?: No  Information entered by :: R. Tauheed Mcfayden LPN   Activities of Daily Living     05/17/2024    2:29 PM 05/17/2024    2:25 PM  In your present state of health, do you have any difficulty performing the following activities:  Hearing? 1 0  Vision? 1 1  Difficulty concentrating or making decisions? 0 0  Walking or climbing stairs? 1 1  Dressing or bathing? 0 0  Doing errands, shopping? 0 0  Preparing Food and eating ? N N  Using the Toilet? N N  In the past six months, have you accidently leaked urine? N N  Do you have problems with loss of bowel control? N N  Managing your Medications? N N  Managing your Finances? N N  Housekeeping or managing your Housekeeping? N N    Patient Care Team: Primus Prentice DASEN, MD as Referring Physician (Vascular Surgery) Mardee Lynwood SQUIBB, MD (Orthopedic Surgery)  I have updated your Care Teams any recent Medical Services you may have received from other providers in the past year.     Assessment:   This is a routine wellness examination for Brenen.  Hearing/Vision screen Hearing Screening - Comments:: No issues Vision Screening - Comments:: Glasses, loss of vision in right  eye   Goals Addressed             This Visit's Progress    Patient Stated       Wants to stay active and mow his yard  Depression Screen     05/17/2024    2:35 PM 07/08/2023   10:37 AM 03/06/2020    8:12 AM 03/17/2019    3:28 PM 11/17/2017    1:42 PM 05/19/2017    3:25 PM 11/14/2016    2:52 PM  PHQ 2/9 Scores  PHQ - 2 Score 0 0 0 0 0 0 0  PHQ- 9 Score 0 0         Fall Risk     05/17/2024    2:30 PM 02/09/2024    8:45 AM 07/08/2023   10:37 AM 07/08/2023    9:36 AM 03/06/2020    8:12 AM  Fall Risk   Falls in the past year? 1 1 0 0 0  Number falls in past yr: 0 0 0 0 0  Injury with Fall? 0 1 0 0 0  Risk for fall due to : History of fall(s);Impaired balance/gait Impaired balance/gait No Fall Risks No Fall Risks   Follow up Falls evaluation completed;Falls prevention discussed Falls evaluation completed;Education provided Falls evaluation completed Falls evaluation completed Falls evaluation completed      Data saved with a previous flowsheet row definition    MEDICARE RISK AT HOME:  Medicare Risk at Home Any stairs in or around the home?: Yes If so, are there any without handrails?: No Home free of loose throw rugs in walkways, pet beds, electrical cords, etc?: Yes Adequate lighting in your home to reduce risk of falls?: Yes Life alert?: No Use of a cane, walker or w/c?: Yes Grab bars in the bathroom?: Yes Shower chair or bench in shower?: No Elevated toilet seat or a handicapped toilet?: Yes  TIMED UP AND GO:  Was the test performed?  No  Cognitive Function: 6CIT completed        05/17/2024    2:40 PM 11/17/2017    1:49 PM  6CIT Screen  What Year? 0 points 0 points  What month? 0 points 0 points  What time? 0 points 0 points  Count back from 20 0 points 0 points  Months in reverse 0 points 0 points  Repeat phrase 0 points 0 points  Total Score 0 points 0 points    Immunizations Immunization History  Administered Date(s) Administered   Fluad  Trivalent(High Dose 65+) 07/08/2023   INFLUENZA, HIGH DOSE SEASONAL PF 10/14/2018   Influenza,inj,Quad PF,6+ Mos 07/11/2015, 05/26/2020   Influenza-Unspecified 07/11/2015, 05/30/2023   PNEUMOCOCCAL CONJUGATE-20 08/11/2023   Pneumococcal Polysaccharide-23 03/17/2019   Pneumococcal-Unspecified 08/24/2012   Td 03/06/2020   Tdap 05/11/2009   Unspecified SARS-COV-2 Vaccination 05/30/2023    Screening Tests Health Maintenance  Topic Date Due   Zoster Vaccines- Shingrix (1 of 2) Never done   Medicare Annual Wellness (AWV)  08/28/2021   Influenza Vaccine  03/26/2024   COVID-19 Vaccine (2 - 2024-25 season) 04/26/2024   DTaP/Tdap/Td (3 - Td or Tdap) 03/06/2030   Pneumococcal Vaccine: 50+ Years  Completed   HPV VACCINES  Aged Out   Meningococcal B Vaccine  Aged Out   Colonoscopy  Discontinued   Hepatitis C Screening  Discontinued    Health Maintenance Items Addressed: Discussed the need to update flu vaccine.  Patient declines covid and shingles vaccines  Additional Screening:  Vision Screening: Recommended annual ophthalmology exams for early detection of glaucoma and other disorders of the eye. Is the patient up to date with their annual eye exam?  Yes  Who is the provider or what is the name of the office in  which the patient attends annual eye exams? [facility]  Dental Screening: Recommended annual dental exams for proper oral hygiene  Community Resource Referral / Chronic Care Management: CRR required this visit?  No   CCM required this visit?  No   Plan:    I have personally reviewed and noted the following in the patient's chart:   Medical and social history Use of alcohol, tobacco or illicit drugs  Current medications and supplements including opioid prescriptions. Patient is not currently taking opioid prescriptions. Functional ability and status Nutritional status Physical activity Advanced directives List of other physicians Hospitalizations, surgeries, and  ER visits in previous 12 months Vitals Screenings to include cognitive, depression, and falls Referrals and appointments  In addition, I have reviewed and discussed with patient certain preventive protocols, quality metrics, and best practice recommendations. A written personalized care plan for preventive services as well as general preventive health recommendations were provided to patient.   Angeline Fredericks, LPN   [id number]   After Visit Summary: (Pick Up) Due to this being a telephonic visit, with patients personalized plan was offered to patient and patient has requested to Pick up at office.  Notes: Nothing significant to report at this time.

## 2024-08-09 ENCOUNTER — Encounter: Payer: Self-pay | Admitting: Internal Medicine

## 2024-08-09 ENCOUNTER — Ambulatory Visit: Admitting: Internal Medicine

## 2024-08-09 VITALS — BP 116/78 | HR 69 | Temp 98.1°F | Ht 72.0 in | Wt 318.0 lb

## 2024-08-09 DIAGNOSIS — I10 Essential (primary) hypertension: Secondary | ICD-10-CM

## 2024-08-09 DIAGNOSIS — I4819 Other persistent atrial fibrillation: Secondary | ICD-10-CM

## 2024-08-09 DIAGNOSIS — R7303 Prediabetes: Secondary | ICD-10-CM

## 2024-08-09 DIAGNOSIS — I872 Venous insufficiency (chronic) (peripheral): Secondary | ICD-10-CM

## 2024-08-09 DIAGNOSIS — E559 Vitamin D deficiency, unspecified: Secondary | ICD-10-CM

## 2024-08-09 DIAGNOSIS — E538 Deficiency of other specified B group vitamins: Secondary | ICD-10-CM

## 2024-08-09 DIAGNOSIS — E785 Hyperlipidemia, unspecified: Secondary | ICD-10-CM

## 2024-08-09 LAB — LIPID PANEL
Cholesterol: 166 mg/dL (ref 0–200)
HDL: 40.7 mg/dL (ref >39.00)
LDL Cholesterol: 109 mg/dL — ABNORMAL HIGH (ref 0–99)
NonHDL: 125.59
Total CHOL/HDL Ratio: 4
Triglycerides: 85 mg/dL (ref 0.0–149.0)
VLDL: 17 mg/dL (ref 0.0–40.0)

## 2024-08-09 LAB — COMPREHENSIVE METABOLIC PANEL WITH GFR
ALT: 13 U/L (ref 0–53)
AST: 19 U/L (ref 0–37)
Albumin: 4.2 g/dL (ref 3.5–5.2)
Alkaline Phosphatase: 60 U/L (ref 39–117)
BUN: 14 mg/dL (ref 6–23)
CO2: 28 meq/L (ref 19–32)
Calcium: 9.2 mg/dL (ref 8.4–10.5)
Chloride: 104 meq/L (ref 96–112)
Creatinine, Ser: 0.83 mg/dL (ref 0.40–1.50)
GFR: 82.63 mL/min (ref >60.00)
Glucose, Bld: 115 mg/dL — ABNORMAL HIGH (ref 70–99)
Potassium: 4.4 meq/L (ref 3.5–5.1)
Sodium: 138 meq/L (ref 135–145)
Total Bilirubin: 0.8 mg/dL (ref 0.2–1.2)
Total Protein: 6.6 g/dL (ref 6.0–8.3)

## 2024-08-09 LAB — VITAMIN D 25 HYDROXY (VIT D DEFICIENCY, FRACTURES): VITD: 28.73 ng/mL — ABNORMAL LOW (ref 30.00–100.00)

## 2024-08-09 LAB — HEMOGLOBIN A1C: Hgb A1c MFr Bld: 6.2 % (ref 4.6–6.5)

## 2024-08-09 MED ORDER — HYDROCHLOROTHIAZIDE 12.5 MG PO TABS: 12.5000 mg | ORAL_TABLET | Freq: Every day | ORAL | 1 refills | Status: AC

## 2024-08-09 MED ORDER — APIXABAN 5 MG PO TABS: 5.0000 mg | ORAL_TABLET | Freq: Two times a day (BID) | ORAL | 11 refills | Status: AC

## 2024-08-09 MED ORDER — ATENOLOL 50 MG PO TABS: 50.0000 mg | ORAL_TABLET | ORAL | 3 refills | Status: AC

## 2024-08-09 MED ORDER — BENAZEPRIL HCL 10 MG PO TABS: 10.0000 mg | ORAL_TABLET | Freq: Every day | ORAL | 3 refills | Status: AC

## 2024-08-09 NOTE — Assessment & Plan Note (Signed)
-  Patient has chronic venous insufficiency noted on physical exam - We discussed the use of compression stockings today given venous stasis changes as well as 1+ bilateral lower extremity pitting edema -Patient's wife states that they do have some compression socks at home and will try and use these to see if this will help -No further workup at this time

## 2024-08-09 NOTE — Progress Notes (Signed)
 Acute Office Visit  Subjective:     Patient ID: Hector Bennett, male    DOB: 1946/03/01, 78 y.o.   MRN: [id number]  Chief Complaint  Patient presents with   Medical Management of Chronic Issues    Discussed the use of AI scribe software for clinical note transcription with the patient, who gave verbal consent to proceed.  History of Present Illness Hector Bennett is an 78 year old male with atrial fibrillation and a history of deep vein thrombosis who presents for medication refills and blood work.  Anticoagulation management - Currently taking Eliquis  for atrial fibrillation and history of deep vein thrombosis. - Switched from Xarelto  to Eliquis  due to bleeding concerns. - Requests refill of Eliquis . - History of deep vein thrombosis in right leg, which remains more swollen than the left. 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 - Inferior vena cava filter placed and removed twice in the past.  Hypertension - Takes hydrochlorothiazide , atenolol , and benazepril  for blood pressure control. - Blood pressure has been low at times.  Hyperlipidemia - Previously treated with Zetia  for cholesterol. - Discontinued Zetia  due to muscle cramps and back pain, which improved after stopping the medication.  Prediabetes - A1c levels are being monitored and remain in the prediabetic range.  Obesity and lifestyle modification - Current weight is 318 pounds. - Managing diet, but dislikes certain foods such as lettuce and pasta. - Stopped consuming beer, which he previously enjoyed, especially in the summer. - Uses a pedal exerciser for physical activity.  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 - History of knee replacement surgery.  Vitamin d  supplementation - Here to assess the need for continued high-dose vitamin D .    Review of Systems   Constitutional: Negative.   HENT: Negative.    Respiratory: Negative.    Cardiovascular: Negative.   Musculoskeletal:  Positive for back pain.  Neurological: Negative.   Psychiatric/Behavioral: Negative.          Objective:    BP 116/78   Pulse 69   Temp 98.1 F (36.7 C)   Ht 6' (1.829 m)   Wt (!) 318 lb (144.2 kg)   SpO2 99%   BMI 43.13 kg/m    Physical Exam Constitutional:      Appearance: Normal appearance.  HENT:     Head: Normocephalic and atraumatic.  Cardiovascular:     Rate and Rhythm: Normal rate and regular rhythm.     Heart sounds: Normal heart sounds.  Pulmonary:     Effort: Pulmonary effort is normal.     Breath sounds: Normal breath sounds. No wheezing, rhonchi or rales.  Abdominal:     General: Bowel sounds are normal. There is no distension.     Palpations: Abdomen is soft.     Tenderness: There is no abdominal tenderness. There is no guarding or rebound.  Musculoskeletal:        General: Swelling present. No tenderness.     Right lower leg: Edema present.     Left lower leg: Edema present.     Comments: Patient with 1+ bilateral lower extremity pitting edema worse on the right than the left  Skin:    Comments: Chronic venous stasis changes noted bilaterally in his lower extremities  Neurological:     Mental Status: He is alert.  Psychiatric:        Mood and Affect:  Mood normal.        Behavior: Behavior normal.     No results found for any visits on 08/09/24.      Assessment & Plan:   Problem List Items Addressed This Visit       Cardiovascular and Mediastinum   A-fib (HCC)   - This problem is chronic and stable -Patient was previously on Xarelto  but currently on Eliquis  -Patient denies any palpitations or lightheadedness or dizziness -On exam, patient appears to be in normal sinus rhythm today -Will continue with Eliquis  and atenolol  -I have put in a refill for both these medications today      Relevant Medications    apixaban  (ELIQUIS ) 5 MG TABS tablet   atenolol  (TENORMIN ) 50 MG tablet   benazepril  (LOTENSIN ) 10 MG tablet   hydrochlorothiazide  (HYDRODIURIL ) 12.5 MG tablet   Chronic venous insufficiency   -Patient has chronic venous insufficiency noted on physical exam - We discussed the use of compression stockings today given venous stasis changes as well as 1+ bilateral lower extremity pitting edema -Patient's wife states that they do have some compression socks at home and will try and use these to see if this will help -No further workup at this time      Relevant Medications   apixaban  (ELIQUIS ) 5 MG TABS tablet   atenolol  (TENORMIN ) 50 MG tablet   benazepril  (LOTENSIN ) 10 MG tablet   hydrochlorothiazide  (HYDRODIURIL ) 12.5 MG tablet   Hypertension - Primary   - This problem is chronic and stable -Blood pressure is well-controlled currently at 116/78 -Patient is currently on atenolol , benazepril  and hydrochlorothiazide  (has been taking 25 mg instead of 12.5 mg) -Wife has noted low blood pressures at home and his blood pressure today without taking his medications was 116/78 -He was previously noted to be lightheaded at his last visit - Advised the patient to hold hydrochlorothiazide  for now and continue with atenolol  and benazepril  -If his blood pressures are noted to be higher at home off the hydrochlorothiazide  he can start hydrochlorothiazide  12.5 mg daily (I have called this in for him) -Patient and wife expressed understanding and are in agreement with plan      Relevant Medications   apixaban  (ELIQUIS ) 5 MG TABS tablet   atenolol  (TENORMIN ) 50 MG tablet   benazepril  (LOTENSIN ) 10 MG tablet   hydrochlorothiazide  (HYDRODIURIL ) 12.5 MG tablet   Other Relevant Orders   Comprehensive metabolic panel with GFR     Other   Hyperlipidemia   - This problems chronic and stable -Patient states that he was getting muscle pains and cramps and stopped ezetimibe .  States that the pain has  improved since stopping the ezetimibe  -Will check lipid panel today -Patient may benefit from PCSK9 inhibitor.  Will follow-up lab work and then discussed with patient -No further workup at this time      Relevant Medications   apixaban  (ELIQUIS ) 5 MG TABS tablet   atenolol  (TENORMIN ) 50 MG tablet   benazepril  (LOTENSIN ) 10 MG tablet   hydrochlorothiazide  (HYDRODIURIL ) 12.5 MG tablet   Other Relevant Orders   Lipid panel   Morbid obesity (HCC)   - This problem is chronic and stable -Patient's BMI is greater than 40 meeting the criteria for morbid obesity -Patient states that he has been working on his diet and has cut out beer as well as exercising with pedal exerciser -His weight appears to be relatively stable at 318 pounds today -He may benefit from referral to weight and wellness clinic.  Will discuss at next visit -No further workup at this time      Prediabetes   - This problem is chronic and stable -Last A1c was 6.1 -Will recheck his A1c today -If he is in the diabetic range would consider GLP-1 agonist to help with weight as well as diabetes -No further workup at this time      Relevant Orders   Hemoglobin A1c   Vitamin B 12 deficiency   - This problems chronic and stable -Patient's last vitamin B12 level done last year was low normal -Will recheck his vitamin B12 level today -No further workup at this time      Relevant Orders   Vitamin B12   Vitamin D  deficiency   - This problem is chronic and stable -Patient was noted to have vitamin D  deficiency and was started on high-dose vitamin D  supplementation and completed that course -Will recheck vitamin D  level today to see if he needs additional high-dose vitamin D  supplementation -No further workup at this time      Relevant Orders   VITAMIN D  25 Hydroxy (Vit-D Deficiency, Fractures)    Meds ordered this encounter  Medications   apixaban  (ELIQUIS ) 5 MG TABS tablet    Sig: Take 1 tablet (5 mg total) by  mouth 2 (two) times daily.    Dispense:  60 tablet    Refill:  11   atenolol  (TENORMIN ) 50 MG tablet    Sig: Take 1 tablet (50 mg total) by mouth every morning.    Dispense:  90 tablet    Refill:  3   benazepril  (LOTENSIN ) 10 MG tablet    Sig: Take 1 tablet (10 mg total) by mouth daily.    Dispense:  90 tablet    Refill:  3   hydrochlorothiazide  (HYDRODIURIL ) 12.5 MG tablet    Sig: Take 1 tablet (12.5 mg total) by mouth daily.    Dispense:  90 tablet    Refill:  1    Return in about 3 months (around 11/07/2024).  Presten Joost, MD

## 2024-08-09 NOTE — Assessment & Plan Note (Signed)
-   This problems chronic and stable -Patient states that he was getting muscle pains and cramps and stopped ezetimibe .  States that the pain has improved since stopping the ezetimibe  -Will check lipid panel today -Patient may benefit from PCSK9 inhibitor.  Will follow-up lab work and then discussed with patient -No further workup at this time

## 2024-08-09 NOTE — Assessment & Plan Note (Signed)
-   This problems chronic and stable -Patient's last vitamin B12 level done last year was low normal -Will recheck his vitamin B12 level today -No further workup at this time

## 2024-08-09 NOTE — Assessment & Plan Note (Signed)
-   This problem is chronic and stable -Last A1c was 6.1 -Will recheck his A1c today -If he is in the diabetic range would consider GLP-1 agonist to help with weight as well as diabetes -No further workup at this time

## 2024-08-09 NOTE — Assessment & Plan Note (Signed)
-   This problem is chronic and stable -Patient's BMI is greater than 40 meeting the criteria for morbid obesity -Patient states that he has been working on his diet and has cut out beer as well as exercising with pedal exerciser -His weight appears to be relatively stable at 318 pounds today -He may benefit from referral to weight and wellness clinic.  Will discuss at next visit -No further workup at this time

## 2024-08-09 NOTE — Patient Instructions (Addendum)
°  VISIT SUMMARY: Today, you came in for medication refills and blood work. We discussed your ongoing health issues, including atrial fibrillation, hypertension, chronic venous insufficiency, obesity, prediabetes, and osteoarthritis. We also reviewed your current medications and made some adjustments to better manage your conditions.  YOUR PLAN: -PERSISTENT ATRIAL FIBRILLATION: Atrial fibrillation is an irregular and often rapid heart rate. We are managing this with Eliquis  due to its lower bleeding risk compared to Xarelto . Your prescription for Eliquis  has been refilled.  -PRIMARY HYPERTENSION: Hypertension is high blood pressure. Your blood pressure has been low, likely due to weight loss and your current medication. We have paused your hydrochlorothiazide  and instructed you to monitor your blood pressure at home. We refilled hydrochlorothiazide  at 12.5 mg to use if you notice elevated blood pressures at home. Continue taking atenolol  and benazepril  as prescribed.  -CHRONIC VENOUS INSUFFICIENCY WITH HISTORY OF DVT, RIGHT LEG: Chronic venous insufficiency is a condition where the veins have problems sending blood from the legs back to the heart, often causing swelling and discoloration. Consider using over-the-counter compression stockings if available in your size.  -OBESITY: Obesity is having an excessive amount of body fat. Your current weight is 318 lbs. Managing your weight is crucial due to its impact on your prediabetes and joint health. Continue monitoring your weight and dietary habits.  -PREDIABETES: Prediabetes is a condition where blood sugar levels are higher than normal but not yet high enough to be diagnosed as diabetes. We will recheck your A1c levels to assess your current status. An A1c test has been ordered. We will consider a once-weekly injection if your A1c indicates diabetes for weight management and blood sugar control.   -GENERAL HEALTH MAINTENANCE: We have ordered routine  blood work to assess your kidney function, liver function, vitamin D  levels, vitamin B12 and cholesterol levels.  INSTRUCTIONS: Please monitor your blood pressure at home and keep a record of the readings. Follow up with the ordered blood work to assess your A1c, kidney function, liver function, vitamin D  levels, vitamin B12 and cholesterol levels. Continue taking your medications as prescribed and consider using compression stockings for your leg swelling. We will review your blood work results and make any necessary adjustments to your treatment plan.                      Contains text generated by [employer].                                 Contains text generated by [employer].

## 2024-08-09 NOTE — Assessment & Plan Note (Signed)
-   This problem is chronic and stable -Blood pressure is well-controlled currently at 116/78 -Patient is currently on atenolol , benazepril  and hydrochlorothiazide  (has been taking 25 mg instead of 12.5 mg) -Wife has noted low blood pressures at home and his blood pressure today without taking his medications was 116/78 -He was previously noted to be lightheaded at his last visit - Advised the patient to hold hydrochlorothiazide  for now and continue with atenolol  and benazepril  -If his blood pressures are noted to be higher at home off the hydrochlorothiazide  he can start hydrochlorothiazide  12.5 mg daily (I have called this in for him) -Patient and wife expressed understanding and are in agreement with plan

## 2024-08-09 NOTE — Assessment & Plan Note (Signed)
-   This problem is chronic and stable -Patient was previously on Xarelto  but currently on Eliquis  -Patient denies any palpitations or lightheadedness or dizziness -On exam, patient appears to be in normal sinus rhythm today -Will continue with Eliquis  and atenolol  -I have put in a refill for both these medications today

## 2024-08-09 NOTE — Assessment & Plan Note (Signed)
-   This problem is chronic and stable -Patient was noted to have vitamin D  deficiency and was started on high-dose vitamin D  supplementation and completed that course -Will recheck vitamin D  level today to see if he needs additional high-dose vitamin D  supplementation -No further workup at this time

## 2024-08-11 ENCOUNTER — Ambulatory Visit: Payer: Self-pay | Admitting: Internal Medicine

## 2024-08-12 ENCOUNTER — Other Ambulatory Visit: Payer: Self-pay | Admitting: Family

## 2024-08-12 MED ORDER — VITAMIN D (ERGOCALCIFEROL) 1.25 MG (50000 UNIT) PO CAPS: 50000.0000 [IU] | ORAL_CAPSULE | ORAL | 0 refills | Status: AC

## 2024-10-04 ENCOUNTER — Encounter

## 2024-11-08 ENCOUNTER — Ambulatory Visit: Admitting: Internal Medicine

## 2025-04-11 ENCOUNTER — Encounter (INDEPENDENT_AMBULATORY_CARE_PROVIDER_SITE_OTHER): Admitting: Ophthalmology

## 2025-05-23 ENCOUNTER — Ambulatory Visit
